# Patient Record
Sex: Female | Born: 1950 | Race: White | Hispanic: No | Smoking: Former smoker
Health system: Southern US, Community
[De-identification: ages and names within clinical notes are randomized; demographics above are authoritative.]

## PROBLEM LIST (undated history)

## (undated) DIAGNOSIS — J449 Chronic obstructive pulmonary disease, unspecified: Secondary | ICD-10-CM

## (undated) DIAGNOSIS — D693 Immune thrombocytopenic purpura: Secondary | ICD-10-CM

## (undated) DIAGNOSIS — I959 Hypotension, unspecified: Secondary | ICD-10-CM

## (undated) DIAGNOSIS — K7460 Unspecified cirrhosis of liver: Secondary | ICD-10-CM

## (undated) DIAGNOSIS — F319 Bipolar disorder, unspecified: Secondary | ICD-10-CM

## (undated) DIAGNOSIS — K760 Fatty (change of) liver, not elsewhere classified: Secondary | ICD-10-CM

## (undated) DIAGNOSIS — K5790 Diverticulosis of intestine, part unspecified, without perforation or abscess without bleeding: Secondary | ICD-10-CM

## (undated) DIAGNOSIS — I700 Atherosclerosis of aorta: Secondary | ICD-10-CM

## (undated) DIAGNOSIS — E559 Vitamin D deficiency, unspecified: Secondary | ICD-10-CM

## (undated) DIAGNOSIS — E119 Type 2 diabetes mellitus without complications: Secondary | ICD-10-CM

## (undated) DIAGNOSIS — I639 Cerebral infarction, unspecified: Secondary | ICD-10-CM

## (undated) HISTORY — DX: Bipolar disorder, unspecified: F31.9

## (undated) HISTORY — DX: Unspecified cirrhosis of liver: K74.60

## (undated) HISTORY — PX: OTHER SURGICAL HISTORY: SHX169

## (undated) HISTORY — DX: Fatty (change of) liver, not elsewhere classified: K76.0

## (undated) HISTORY — DX: Immune thrombocytopenic purpura: D69.3

## (undated) HISTORY — DX: Atherosclerosis of aorta: I70.0

## (undated) HISTORY — PX: ABDOMINAL HYSTERECTOMY: SHX81

## (undated) HISTORY — DX: Diverticulosis of intestine, part unspecified, without perforation or abscess without bleeding: K57.90

## (undated) HISTORY — DX: Vitamin D deficiency, unspecified: E55.9

## (undated) HISTORY — PX: SPLENECTOMY: SUR1306

## (undated) HISTORY — DX: Hypotension, unspecified: I95.9

---

## 1997-09-25 DIAGNOSIS — D693 Immune thrombocytopenic purpura: Secondary | ICD-10-CM | POA: Insufficient documentation

## 1997-10-26 DIAGNOSIS — D693 Immune thrombocytopenic purpura: Secondary | ICD-10-CM | POA: Insufficient documentation

## 1998-09-18 DIAGNOSIS — Z9081 Acquired absence of spleen: Secondary | ICD-10-CM

## 1998-09-18 HISTORY — DX: Acquired absence of spleen: Z90.81

## 1999-02-27 DIAGNOSIS — Z9081 Acquired absence of spleen: Secondary | ICD-10-CM | POA: Insufficient documentation

## 2004-05-30 ENCOUNTER — Ambulatory Visit: Payer: Self-pay | Admitting: Oncology

## 2004-08-22 ENCOUNTER — Ambulatory Visit: Payer: Self-pay | Admitting: Oncology

## 2005-03-30 ENCOUNTER — Ambulatory Visit: Payer: Self-pay | Admitting: Oncology

## 2005-07-03 ENCOUNTER — Ambulatory Visit: Payer: Self-pay | Admitting: Oncology

## 2005-11-02 ENCOUNTER — Ambulatory Visit: Payer: Self-pay | Admitting: Oncology

## 2006-04-19 ENCOUNTER — Ambulatory Visit: Payer: Self-pay | Admitting: Oncology

## 2006-10-04 ENCOUNTER — Ambulatory Visit: Payer: Self-pay | Admitting: Oncology

## 2009-02-26 DIAGNOSIS — D72829 Elevated white blood cell count, unspecified: Secondary | ICD-10-CM | POA: Insufficient documentation

## 2015-11-29 DIAGNOSIS — D693 Immune thrombocytopenic purpura: Secondary | ICD-10-CM | POA: Diagnosis not present

## 2016-12-02 DIAGNOSIS — Z716 Tobacco abuse counseling: Secondary | ICD-10-CM

## 2016-12-02 DIAGNOSIS — I38 Endocarditis, valve unspecified: Secondary | ICD-10-CM | POA: Diagnosis not present

## 2016-12-02 DIAGNOSIS — N6310 Unspecified lump in the right breast, unspecified quadrant: Secondary | ICD-10-CM

## 2016-12-02 DIAGNOSIS — R945 Abnormal results of liver function studies: Secondary | ICD-10-CM | POA: Diagnosis not present

## 2016-12-02 DIAGNOSIS — D693 Immune thrombocytopenic purpura: Secondary | ICD-10-CM | POA: Diagnosis not present

## 2016-12-22 DIAGNOSIS — D241 Benign neoplasm of right breast: Secondary | ICD-10-CM | POA: Insufficient documentation

## 2017-01-29 DIAGNOSIS — Z136 Encounter for screening for cardiovascular disorders: Secondary | ICD-10-CM

## 2017-01-29 HISTORY — PX: BREAST LUMPECTOMY: SHX2

## 2017-02-18 DIAGNOSIS — Z09 Encounter for follow-up examination after completed treatment for conditions other than malignant neoplasm: Secondary | ICD-10-CM | POA: Insufficient documentation

## 2017-06-18 DIAGNOSIS — Z9081 Acquired absence of spleen: Secondary | ICD-10-CM | POA: Diagnosis not present

## 2017-06-18 DIAGNOSIS — Z720 Tobacco use: Secondary | ICD-10-CM | POA: Diagnosis not present

## 2017-06-18 DIAGNOSIS — F329 Major depressive disorder, single episode, unspecified: Secondary | ICD-10-CM

## 2017-06-18 DIAGNOSIS — D693 Immune thrombocytopenic purpura: Secondary | ICD-10-CM

## 2017-06-18 DIAGNOSIS — D72828 Other elevated white blood cell count: Secondary | ICD-10-CM | POA: Diagnosis not present

## 2017-12-02 DIAGNOSIS — D72829 Elevated white blood cell count, unspecified: Secondary | ICD-10-CM

## 2017-12-02 DIAGNOSIS — D693 Immune thrombocytopenic purpura: Secondary | ICD-10-CM | POA: Diagnosis not present

## 2017-12-02 DIAGNOSIS — Z9081 Acquired absence of spleen: Secondary | ICD-10-CM

## 2017-12-02 DIAGNOSIS — R928 Other abnormal and inconclusive findings on diagnostic imaging of breast: Secondary | ICD-10-CM | POA: Diagnosis not present

## 2017-12-02 DIAGNOSIS — B370 Candidal stomatitis: Secondary | ICD-10-CM | POA: Diagnosis not present

## 2017-12-02 DIAGNOSIS — Z720 Tobacco use: Secondary | ICD-10-CM

## 2018-05-25 DIAGNOSIS — S5290XB Unspecified fracture of unspecified forearm, initial encounter for open fracture type I or II: Secondary | ICD-10-CM

## 2018-05-25 DIAGNOSIS — S52502B Unspecified fracture of the lower end of left radius, initial encounter for open fracture type I or II: Secondary | ICD-10-CM

## 2018-05-26 DIAGNOSIS — S5290XB Unspecified fracture of unspecified forearm, initial encounter for open fracture type I or II: Secondary | ICD-10-CM | POA: Diagnosis not present

## 2018-05-26 DIAGNOSIS — S52502B Unspecified fracture of the lower end of left radius, initial encounter for open fracture type I or II: Secondary | ICD-10-CM | POA: Diagnosis not present

## 2018-05-27 DIAGNOSIS — S5290XB Unspecified fracture of unspecified forearm, initial encounter for open fracture type I or II: Secondary | ICD-10-CM | POA: Diagnosis not present

## 2018-05-27 DIAGNOSIS — S52502B Unspecified fracture of the lower end of left radius, initial encounter for open fracture type I or II: Secondary | ICD-10-CM | POA: Diagnosis not present

## 2019-06-30 LAB — HM DEXA SCAN

## 2021-01-31 ENCOUNTER — Encounter: Payer: Self-pay | Admitting: Hematology and Oncology

## 2021-01-31 ENCOUNTER — Other Ambulatory Visit: Payer: Self-pay | Admitting: Hematology and Oncology

## 2021-01-31 DIAGNOSIS — D693 Immune thrombocytopenic purpura: Secondary | ICD-10-CM

## 2021-02-05 ENCOUNTER — Inpatient Hospital Stay (HOSPITAL_COMMUNITY)
Admission: EM | Admit: 2021-02-05 | Discharge: 2021-02-20 | DRG: 813 | Disposition: A | Discharge status: Home Health | Payer: Medicare Other | Attending: Internal Medicine | Admitting: Internal Medicine

## 2021-02-05 ENCOUNTER — Emergency Department (HOSPITAL_COMMUNITY): Payer: Medicare Other

## 2021-02-05 ENCOUNTER — Other Ambulatory Visit: Payer: Self-pay

## 2021-02-05 ENCOUNTER — Encounter (HOSPITAL_COMMUNITY): Payer: Self-pay

## 2021-02-05 DIAGNOSIS — D62 Acute posthemorrhagic anemia: Secondary | ICD-10-CM | POA: Diagnosis present

## 2021-02-05 DIAGNOSIS — F17210 Nicotine dependence, cigarettes, uncomplicated: Secondary | ICD-10-CM | POA: Diagnosis present

## 2021-02-05 DIAGNOSIS — R21 Rash and other nonspecific skin eruption: Secondary | ICD-10-CM | POA: Diagnosis present

## 2021-02-05 DIAGNOSIS — Z09 Encounter for follow-up examination after completed treatment for conditions other than malignant neoplasm: Secondary | ICD-10-CM

## 2021-02-05 DIAGNOSIS — L89156 Pressure-induced deep tissue damage of sacral region: Secondary | ICD-10-CM | POA: Diagnosis present

## 2021-02-05 DIAGNOSIS — J449 Chronic obstructive pulmonary disease, unspecified: Secondary | ICD-10-CM | POA: Diagnosis present

## 2021-02-05 DIAGNOSIS — Z7982 Long term (current) use of aspirin: Secondary | ICD-10-CM | POA: Diagnosis not present

## 2021-02-05 DIAGNOSIS — R062 Wheezing: Secondary | ICD-10-CM

## 2021-02-05 DIAGNOSIS — R262 Difficulty in walking, not elsewhere classified: Secondary | ICD-10-CM | POA: Diagnosis present

## 2021-02-05 DIAGNOSIS — K922 Gastrointestinal hemorrhage, unspecified: Secondary | ICD-10-CM | POA: Diagnosis not present

## 2021-02-05 DIAGNOSIS — Y929 Unspecified place or not applicable: Secondary | ICD-10-CM

## 2021-02-05 DIAGNOSIS — D693 Immune thrombocytopenic purpura: Principal | ICD-10-CM | POA: Diagnosis present

## 2021-02-05 DIAGNOSIS — Z20822 Contact with and (suspected) exposure to covid-19: Secondary | ICD-10-CM | POA: Diagnosis present

## 2021-02-05 DIAGNOSIS — I5033 Acute on chronic diastolic (congestive) heart failure: Secondary | ICD-10-CM | POA: Diagnosis present

## 2021-02-05 DIAGNOSIS — E039 Hypothyroidism, unspecified: Secondary | ICD-10-CM | POA: Diagnosis present

## 2021-02-05 DIAGNOSIS — M25511 Pain in right shoulder: Secondary | ICD-10-CM | POA: Diagnosis present

## 2021-02-05 DIAGNOSIS — Z79899 Other long term (current) drug therapy: Secondary | ICD-10-CM

## 2021-02-05 DIAGNOSIS — Z9081 Acquired absence of spleen: Secondary | ICD-10-CM

## 2021-02-05 DIAGNOSIS — K921 Melena: Secondary | ICD-10-CM | POA: Diagnosis present

## 2021-02-05 DIAGNOSIS — R578 Other shock: Secondary | ICD-10-CM | POA: Diagnosis present

## 2021-02-05 DIAGNOSIS — Z8673 Personal history of transient ischemic attack (TIA), and cerebral infarction without residual deficits: Secondary | ICD-10-CM | POA: Diagnosis not present

## 2021-02-05 DIAGNOSIS — T380X5A Adverse effect of glucocorticoids and synthetic analogues, initial encounter: Secondary | ICD-10-CM | POA: Diagnosis present

## 2021-02-05 DIAGNOSIS — D72829 Elevated white blood cell count, unspecified: Secondary | ICD-10-CM | POA: Diagnosis present

## 2021-02-05 DIAGNOSIS — R040 Epistaxis: Secondary | ICD-10-CM | POA: Diagnosis not present

## 2021-02-05 DIAGNOSIS — E871 Hypo-osmolality and hyponatremia: Secondary | ICD-10-CM | POA: Diagnosis not present

## 2021-02-05 DIAGNOSIS — I959 Hypotension, unspecified: Secondary | ICD-10-CM | POA: Diagnosis not present

## 2021-02-05 DIAGNOSIS — Z7951 Long term (current) use of inhaled steroids: Secondary | ICD-10-CM

## 2021-02-05 DIAGNOSIS — R571 Hypovolemic shock: Secondary | ICD-10-CM | POA: Diagnosis not present

## 2021-02-05 DIAGNOSIS — W1830XA Fall on same level, unspecified, initial encounter: Secondary | ICD-10-CM | POA: Diagnosis present

## 2021-02-05 DIAGNOSIS — R059 Cough, unspecified: Secondary | ICD-10-CM

## 2021-02-05 DIAGNOSIS — D649 Anemia, unspecified: Secondary | ICD-10-CM | POA: Diagnosis not present

## 2021-02-05 DIAGNOSIS — D72819 Decreased white blood cell count, unspecified: Secondary | ICD-10-CM | POA: Diagnosis not present

## 2021-02-05 HISTORY — DX: Cerebral infarction, unspecified: I63.9

## 2021-02-05 HISTORY — DX: Chronic obstructive pulmonary disease, unspecified: J44.9

## 2021-02-05 LAB — CBC WITH DIFFERENTIAL/PLATELET
Abs Immature Granulocytes: 1.92 10*3/uL — ABNORMAL HIGH (ref 0.00–0.07)
Basophils Absolute: 0.1 10*3/uL (ref 0.0–0.1)
Basophils Relative: 0 %
Eosinophils Absolute: 0 10*3/uL (ref 0.0–0.5)
Eosinophils Relative: 0 %
HCT: 12.5 % — ABNORMAL LOW (ref 36.0–46.0)
Hemoglobin: 4 g/dL — CL (ref 12.0–15.0)
Immature Granulocytes: 6 %
Lymphocytes Relative: 17 %
Lymphs Abs: 5.8 10*3/uL — ABNORMAL HIGH (ref 0.7–4.0)
MCH: 36.7 pg — ABNORMAL HIGH (ref 26.0–34.0)
MCHC: 32 g/dL (ref 30.0–36.0)
MCV: 114.7 fL — ABNORMAL HIGH (ref 80.0–100.0)
Monocytes Absolute: 2.4 10*3/uL — ABNORMAL HIGH (ref 0.1–1.0)
Monocytes Relative: 7 %
Neutro Abs: 23.8 10*3/uL — ABNORMAL HIGH (ref 1.7–7.7)
Neutrophils Relative %: 70 %
Platelets: 5 10*3/uL — CL (ref 150–400)
RBC: 1.09 MIL/uL — ABNORMAL LOW (ref 3.87–5.11)
RDW: 19 % — ABNORMAL HIGH (ref 11.5–15.5)
Smear Review: NORMAL
WBC: 34.1 10*3/uL — ABNORMAL HIGH (ref 4.0–10.5)
nRBC: 9 % — ABNORMAL HIGH (ref 0.0–0.2)

## 2021-02-05 LAB — COMPREHENSIVE METABOLIC PANEL
ALT: 43 U/L (ref 0–44)
AST: 41 U/L (ref 15–41)
Albumin: 2.3 g/dL — ABNORMAL LOW (ref 3.5–5.0)
Alkaline Phosphatase: 47 U/L (ref 38–126)
Anion gap: 11 (ref 5–15)
BUN: 42 mg/dL — ABNORMAL HIGH (ref 8–23)
CO2: 17 mmol/L — ABNORMAL LOW (ref 22–32)
Calcium: 7.9 mg/dL — ABNORMAL LOW (ref 8.9–10.3)
Calcium: 9.2 (ref 8.7–10.7)
Chloride: 113 mmol/L — ABNORMAL HIGH (ref 98–111)
Creatinine, Ser: 1.14 mg/dL — ABNORMAL HIGH (ref 0.44–1.00)
GFR, Estimated: 52 mL/min — ABNORMAL LOW (ref >60)
Glucose, Bld: 152 mg/dL — ABNORMAL HIGH (ref 70–99)
Potassium: 3.8 mmol/L (ref 3.5–5.1)
Sodium: 141 mmol/L (ref 135–145)
Total Bilirubin: 0.6 mg/dL (ref 0.3–1.2)
Total Protein: 4.9 g/dL — ABNORMAL LOW (ref 6.5–8.1)

## 2021-02-05 LAB — LACTIC ACID, PLASMA
Lactic Acid, Venous: 5 mmol/L (ref 0.5–1.9)
Lactic Acid, Venous: 4.4 mmol/L (ref 0.5–1.9)

## 2021-02-05 LAB — BASIC METABOLIC PANEL
BUN: 18 (ref 4–21)
CO2: 28 — AB (ref 13–22)
Chloride: 96 — AB (ref 99–108)
Creatinine: 0.8 (ref 0.5–1.1)
Glucose: 112
Potassium: 4.7 (ref 3.4–5.3)
Sodium: 132 — AB (ref 137–147)

## 2021-02-05 LAB — CBC AND DIFFERENTIAL
HCT: 39 (ref 36–46)
Hemoglobin: 12.6 (ref 12.0–16.0)
Platelets: 63 — AB (ref 150–399)
WBC: 26.9

## 2021-02-05 LAB — I-STAT CHEM 8, ED
BUN: 41 mg/dL — ABNORMAL HIGH (ref 8–23)
Calcium, Ion: 1.13 mmol/L — ABNORMAL LOW (ref 1.15–1.40)
Chloride: 110 mmol/L (ref 98–111)
Creatinine, Ser: 0.9 mg/dL (ref 0.44–1.00)
Glucose, Bld: 146 mg/dL — ABNORMAL HIGH (ref 70–99)
HCT: 19 % — ABNORMAL LOW (ref 36.0–46.0)
Hemoglobin: 6.5 g/dL — CL (ref 12.0–15.0)
Potassium: 3.9 mmol/L (ref 3.5–5.1)
Sodium: 141 mmol/L (ref 135–145)
TCO2: 18 mmol/L — ABNORMAL LOW (ref 22–32)

## 2021-02-05 LAB — RESP PANEL BY RT-PCR (FLU A&B, COVID) ARPGX2
Influenza A by PCR: NEGATIVE
Influenza B by PCR: NEGATIVE
SARS Coronavirus 2 by RT PCR: NEGATIVE

## 2021-02-05 LAB — PREPARE RBC (CROSSMATCH)

## 2021-02-05 LAB — CBC: RBC: 3.88 (ref 3.87–5.11)

## 2021-02-05 LAB — TROPONIN I (HIGH SENSITIVITY)
Troponin I (High Sensitivity): 21 ng/L — ABNORMAL HIGH (ref <18)
Troponin I (High Sensitivity): 27 ng/L — ABNORMAL HIGH (ref <18)

## 2021-02-05 LAB — PROTIME-INR
INR: 1.4 — ABNORMAL HIGH (ref 0.8–1.2)
Prothrombin Time: 16.7 seconds — ABNORMAL HIGH (ref 11.4–15.2)

## 2021-02-05 LAB — SAVE SMEAR(SSMR), FOR PROVIDER SLIDE REVIEW

## 2021-02-05 LAB — APTT: aPTT: 20 seconds — ABNORMAL LOW (ref 24–36)

## 2021-02-05 LAB — ABO/RH: ABO/RH(D): O POS

## 2021-02-05 LAB — POC OCCULT BLOOD, ED: Fecal Occult Bld: POSITIVE — AB

## 2021-02-05 LAB — HEMOGLOBIN AND HEMATOCRIT, BLOOD
HCT: 30.1 % — ABNORMAL LOW (ref 36.0–46.0)
Hemoglobin: 9.9 g/dL — ABNORMAL LOW (ref 12.0–15.0)

## 2021-02-05 MED ORDER — FENTANYL CITRATE (PF) 100 MCG/2ML IJ SOLN
50.0000 ug | Freq: Once | INTRAMUSCULAR | Status: AC
  Administered 2021-02-05: 50 ug via INTRAVENOUS
  Filled 2021-02-05: qty 2

## 2021-02-05 MED ORDER — SODIUM CHLORIDE 0.9% IV SOLUTION: Freq: Once | INTRAVENOUS | Status: DC

## 2021-02-05 MED ORDER — SODIUM CHLORIDE 0.9 % IV SOLN: 10.0000 mL/h | Freq: Once | INTRAVENOUS | Status: DC

## 2021-02-05 MED ORDER — SODIUM CHLORIDE 0.9 % IV BOLUS: 500.0000 mL | Freq: Once | INTRAVENOUS | Status: DC

## 2021-02-05 MED ORDER — PANTOPRAZOLE SODIUM 40 MG IV SOLR
40.0000 mg | Freq: Once | INTRAVENOUS | Status: AC
  Administered 2021-02-05: 40 mg via INTRAVENOUS
  Filled 2021-02-05: qty 40

## 2021-02-05 MED ORDER — SODIUM CHLORIDE 0.9 % IV BOLUS
1000.0000 mL | Freq: Once | INTRAVENOUS | Status: AC
  Administered 2021-02-05: 1000 mL via INTRAVENOUS

## 2021-02-05 MED ORDER — IMMUNE GLOBULIN (HUMAN) 10 GM/100ML IV SOLN
1.0000 g/kg | INTRAVENOUS | Status: AC
  Administered 2021-02-06 – 2021-02-07 (×2): 85 g via INTRAVENOUS
  Filled 2021-02-05 (×2): qty 800

## 2021-02-05 MED ORDER — DIPHENHYDRAMINE HCL 50 MG/ML IJ SOLN
25.0000 mg | Freq: Once | INTRAMUSCULAR | Status: AC
  Administered 2021-02-05: 25 mg via INTRAVENOUS
  Filled 2021-02-05: qty 1

## 2021-02-05 MED ORDER — NOREPINEPHRINE 4 MG/250ML-% IV SOLN
2.0000 ug/min | INTRAVENOUS | Status: DC
  Administered 2021-02-05: 2 ug/min via INTRAVENOUS
  Administered 2021-02-06: 6 ug/min via INTRAVENOUS
  Filled 2021-02-05 (×2): qty 250

## 2021-02-05 MED ORDER — MOMETASONE FURO-FORMOTEROL FUM 100-5 MCG/ACT IN AERO
2.0000 | INHALATION_SPRAY | Freq: Two times a day (BID) | RESPIRATORY_TRACT | Status: DC
  Administered 2021-02-06 – 2021-02-20 (×29): 2 via RESPIRATORY_TRACT
  Filled 2021-02-05: qty 8.8

## 2021-02-05 MED ORDER — CALCIUM GLUCONATE-NACL 1-0.675 GM/50ML-% IV SOLN
1.0000 g | Freq: Once | INTRAVENOUS | Status: AC
  Administered 2021-02-05: 1000 mg via INTRAVENOUS
  Filled 2021-02-05: qty 50

## 2021-02-05 MED ORDER — SODIUM CHLORIDE 0.9 % IV SOLN
250.0000 mL | INTRAVENOUS | Status: DC
Start: 2021-02-05 — End: 2021-02-20

## 2021-02-05 MED ORDER — IPRATROPIUM-ALBUTEROL 0.5-2.5 (3) MG/3ML IN SOLN: 3.0000 mL | RESPIRATORY_TRACT | Status: DC | PRN

## 2021-02-05 MED ORDER — DEXAMETHASONE SODIUM PHOSPHATE 10 MG/ML IJ SOLN
10.0000 mg | INTRAMUSCULAR | Status: DC
  Administered 2021-02-06 – 2021-02-07 (×2): 10 mg via INTRAVENOUS
  Filled 2021-02-05 (×3): qty 1

## 2021-02-05 MED ORDER — ACETAMINOPHEN 10 MG/ML IV SOLN
1000.0000 mg | Freq: Once | INTRAVENOUS | Status: AC
  Administered 2021-02-05: 1000 mg via INTRAVENOUS
  Filled 2021-02-05: qty 100

## 2021-02-05 MED ORDER — SODIUM CHLORIDE 0.9 % IV SOLN
10.0000 mL/h | Freq: Once | INTRAVENOUS | Status: AC
  Administered 2021-02-05: 10 mL/h via INTRAVENOUS

## 2021-02-05 MED ORDER — PANTOPRAZOLE INFUSION (NEW) - SIMPLE MED
8.0000 mg/h | INTRAVENOUS | Status: DC
  Administered 2021-02-05 – 2021-02-06 (×3): 8 mg/h via INTRAVENOUS
  Filled 2021-02-05 (×2): qty 80

## 2021-02-05 MED ORDER — METHYLPREDNISOLONE SODIUM SUCC 125 MG IJ SOLR
125.0000 mg | Freq: Once | INTRAMUSCULAR | Status: AC
  Administered 2021-02-05: 125 mg via INTRAVENOUS
  Filled 2021-02-05: qty 2

## 2021-02-05 NOTE — ED Provider Notes (Addendum)
[facility] EMERGENCY DEPARTMENT Provider Note   CSN: [id number] Arrival date & time: 02/05/21  1332     History Chief Complaint  Patient presents with   Weakness    Weakness for 3 weeks.    Jean Kramer is a 70 y.o. female.  HPI 70 year old female presents with generalized weakness.  This has been ongoing for about 2-3 weeks but much worse today.  She has been short of breath on exertion and somewhat lightheaded.  There has been no chest pain.  She has daily headaches.  No focal weakness but it is more diffuse.  She is also been having dark/black stools for the same time.  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  This has been going on the last 2-3 weeks.  She has been taking BC for her headache though she knows she is not supposed to.  No abdominal pain.  EMS found her to be quite hypotensive to the point they could not feel radial pulses.  They gave about 500 cc IV fluids and her heart rate has come down into the 110s and blood pressure has come up. Most recent BP was in 80s.  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 No pertinent surgical history.   OB History   No obstetric history on file.     History reviewed. No pertinent family history.     Home Medications Prior to Admission medications   Not on File    Allergies    Patient has no allergy information on record.  Review of Systems   Review of Systems  Constitutional:  Positive for fatigue. Negative for fever.  Respiratory:  Positive for shortness of breath.   Cardiovascular:  Negative for chest pain.  Gastrointestinal:  Negative for abdominal pain and vomiting.   Neurological:  Positive for weakness, light-headedness and headaches.   Physical Exam Updated Vital Signs BP (!) 73/46   Pulse (!) 103   Temp 97.6 F (36.4 C) (Oral)   Resp 17   SpO2 100%   Physical Exam Vitals and nursing note reviewed.  Constitutional:      General: She is not in acute distress.    Appearance: She is well-developed. She is ill-appearing. She is not diaphoretic.  HENT:     Head: Normocephalic and atraumatic.     Right Ear: External ear normal.     Left Ear: External ear normal.     Nose: Nose normal.     Mouth/Throat:     Mouth: Mucous membranes are dry.  Eyes:     General:        Right eye: No discharge.        Left eye: No discharge.     Extraocular Movements: Extraocular movements intact.     Pupils: Pupils are equal, round, and reactive to light.  Cardiovascular:     Rate and Rhythm: Regular rhythm. Tachycardia present.     Heart sounds: Normal heart sounds.  Pulmonary:     Effort: Pulmonary effort is normal.     Breath sounds: Normal breath sounds.  Abdominal:     General:  There is no distension.     Palpations: Abdomen is soft.     Tenderness: There is no abdominal tenderness.  Skin:    General: Skin is warm and dry.     Coloration: Skin is pale.     Findings: Rash (diffuse petechiae) present.  Neurological:     Mental Status: She is alert and oriented to person, place, and time.     Comments: CN 3-12 grossly intact. 5/5 strength in all 4 extremities. Grossly normal sensation.  Psychiatric:        Mood and Affect: Mood is not anxious.    ED Results / Procedures / Treatments   Labs (all labs ordered are listed, but only abnormal results are displayed) Labs Reviewed  COMPREHENSIVE METABOLIC PANEL - Abnormal; Notable for the following components:      Result Value   Chloride 113 (*)    CO2 17 (*)    Glucose, Bld 152 (*)    BUN 42 (*)    Creatinine, Ser 1.14 (*)    Calcium 7.9 (*)    Total Protein 4.9 (*)    Albumin 2.3 (*)    GFR,  Estimated 52 (*)    All other components within normal limits  CBC WITH DIFFERENTIAL/PLATELET - Abnormal; Notable for the following components:   WBC 34.1 (*)    RBC 1.09 (*)    Hemoglobin 4.0 (*)    HCT 12.5 (*)    MCV 114.7 (*)    MCH 36.7 (*)    RDW 19.0 (*)    Platelets 5 (*)    nRBC 9.0 (*)    Neutro Abs 23.8 (*)    Lymphs Abs 5.8 (*)    Monocytes Absolute 2.4 (*)    Abs Immature Granulocytes 1.92 (*)    All other components within normal limits  PROTIME-INR - Abnormal; Notable for the following components:   Prothrombin Time 16.7 (*)    INR 1.4 (*)    All other components within normal limits  APTT - Abnormal; Notable for the following components:   aPTT 20 (*)    All other components within normal limits  LACTIC ACID, PLASMA - Abnormal; Notable for the following components:   Lactic Acid, Venous 5.0 (*)    All other components within normal limits  I-STAT CHEM 8, ED - Abnormal; Notable for the following components:   BUN 41 (*)    Glucose, Bld 146 (*)    Calcium, Ion 1.13 (*)    TCO2 18 (*)    Hemoglobin 6.5 (*)    HCT 19.0 (*)    All other components within normal limits  POC OCCULT BLOOD, ED - Abnormal; Notable for the following components:   Fecal Occult Bld POSITIVE (*)    All other components within normal limits  TROPONIN I (HIGH SENSITIVITY) - Abnormal; Notable for the following components:   Troponin I (High Sensitivity) 21 (*)    All other components within normal limits  RESP PANEL BY RT-PCR (FLU A&B, COVID) ARPGX2  URINALYSIS, ROUTINE W REFLEX MICROSCOPIC  LACTIC ACID, PLASMA  TYPE AND SCREEN  PREPARE RBC (CROSSMATCH)  ABO/RH  PREPARE RBC (CROSSMATCH)  PREPARE PLATELET PHERESIS  TROPONIN I (HIGH SENSITIVITY)    EKG EKG Interpretation  Date/Time:  Wednesday February 05 2021 13:33:22 EDT Ventricular Rate:  111 PR Interval:  144 QRS Duration: 61 QT Interval:  314 QTC Calculation: 427 R Axis:   64 Text Interpretation: Sinus tachycardia Abnormal  R-wave progression, early transition Repol  abnrm suggests ischemia, diffuse leads No old tracing to compare Confirmed by Sherwood Gambler [phone]) on 02/05/2021 1:40:48 PM  Radiology CT Head Wo Contrast  Result Date: 02/05/2021 CLINICAL DATA:  Headache, suspected intracranial hemorrhage EXAM: CT HEAD WITHOUT CONTRAST TECHNIQUE: Contiguous axial images were obtained from the base of the skull through the vertex without intravenous contrast. COMPARISON:  03/31/2006 FINDINGS: Brain: No evidence of acute infarction, hemorrhage, hydrocephalus, extra-axial collection or mass lesion/mass effect. Mild atrophy. Vascular: Atherosclerotic and physiologic intracranial calcifications. Skull: Normal. Negative for fracture or focal lesion. Sinuses/Orbits: Dense opacification of the left maxillary sinus and partial opacification of left ethmoid air cells. Orbits unremarkable. Other: 2cm parietal scalp lesion near the midline and1 cm right parietooccipital scalp lesion, possibly sebaceous cyst but nonspecific. IMPRESSION: 1. No acute intracranial process. 2. Left maxillary and ethmoid sinus disease Electronically Signed   By: Lucrezia Europe M.D.   On: 02/05/2021 14:51    Procedures .Critical Care  Date/Time: 02/05/2021 3:39 PM Performed by: Sherwood Gambler, MD Authorized by: Sherwood Gambler, MD   Critical care provider statement:    Critical care time (minutes):  60   Critical care time was exclusive of:  Separately billable procedures and treating other patients   Critical care was necessary to treat or prevent imminent or life-threatening deterioration of the following conditions:  Circulatory failure   Critical care was time spent personally by me on the following activities:  Discussions with consultants, evaluation of patient's response to treatment, examination of patient, ordering and performing treatments and interventions, ordering and review of laboratory studies, ordering and review of radiographic studies,  pulse oximetry, re-evaluation of patient's condition, obtaining history from patient or surrogate and review of old charts   Medications Ordered in ED Medications  0.9 %  sodium chloride infusion (has no administration in time range)  sodium chloride 0.9 % bolus 500 mL (has no administration in time range)  methylPREDNISolone sodium succinate (SOLU-MEDROL) 125 mg/2 mL injection 125 mg (has no administration in time range)  0.9 %  sodium chloride infusion (Manually program via Guardrails IV Fluids) (has no administration in time range)  sodium chloride 0.9 % bolus 1,000 mL (0 mLs Intravenous Stopped 02/05/21 1445)  pantoprazole (PROTONIX) injection 40 mg (40 mg Intravenous Given 02/05/21 1429)  0.9 %  sodium chloride infusion (10 mL/hr Intravenous New Bag/Given 02/05/21 1439)  fentaNYL (SUBLIMAZE) injection 50 mcg (50 mcg Intravenous Given 02/05/21 1425)    ED Course  I have reviewed the triage vital signs and the nursing notes.  Pertinent labs & imaging results that were available during my care of the patient were reviewed by me and considered in my medical decision making (see chart for details).    MDM Rules/Calculators/A&P                           Patient's blood pressure is normal on first appearance with some mild tachycardia.  She was started on some IV fluids.  Initial hemoglobin on the point-of-care testing comes back at 6.5 so unit of blood was ordered.  It sounds like she has been having a slow likely upper GI bleed for weeks.  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  While awaiting on the blood transfusion her blood pressure taint and went into the 40s and 50s.  While she maintained her mental status, her blood pressure was persistently low so emergency release blood was called for.  With this her blood pressure did come up  into the 80s.  Her CBC finally resulted and shows significant thrombocytopenia with a platelet count of 5 and hemoglobin of 4.  I discussed with Dr. Alen Blew, he  recommends 125 mg Solu-Medrol and 1 unit of platelets in addition to the 2 units of blood.  He will consult.  Gastroenterology has also been consulted, Velora Heckler will see.  She has been previously given IV Protonix.  Otherwise, ICU was consulted for admission.  4:04 PM Addendum: Patient's blood pressure dropped again into the 50s.  I have updated ICU.  Discussed with Dr. Alen Blew, will give platelets as per 1:1:1 protocol and only give the 1 extra dose of platelets for the ITP itself.  She will be given FFP and platelets and blood.  Will defer to ICU as far as to specifically activate massive transfusion if not improving after this. Final Clinical Impression(s) / ED Diagnoses Final diagnoses:  Acute blood loss anemia  Acute ITP (HCC)  Hypovolemic shock (HCC)    Rx / DC Orders ED Discharge Orders     None        Sherwood Gambler, MD 02/05/21 1540    Sherwood Gambler, MD 02/05/21 [phone]

## 2021-02-05 NOTE — H&P (Addendum)
NAME:  Jean Kramer, MRN:  [id number], DOB:  Apr 05, 1951, LOS: 0 ADMISSION DATE:  02/05/2021, CONSULTATION DATE:  02/05/21  REFERRING MD:  Sherwood Gambler, MD, CHIEF COMPLAINT:  thrombocytopenia, weakness, hypotension   History of Present Illness:  Jean Kramer is a 70 year old female who presents for 3 weeks of progressive weakness and black stools. States she noticed petechial rash on legs 3 weeks ago and since spread to her arms, chest and back. Notes associated fatigue, lightheadedness, and shortness of breath. Has been having difficulty walking to and from bathroom and feel 2 nights ago. Denies hitting her head or LOC. Since her symptoms started she has been having daily black stools. Has not had any bright red stools. Has had a poor appetite and one episodes of vomiting yesterday night. She is unsure if there was any blood when she threw up. This morning woke up with liquid dark stools and worsening lightheadedness.   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 Has been in remission since then. She stakes daily goody powder and BC powder for headaches. Does not taker her aspirin due to this. Was also started on levothyroxine about a month ago. Otherwise denies any other medication changes or recent illness.  She denies fever, chest pain, dyspnea, hematemesis, brbpr, nausea, vomiting, or abdominal pain.    In ED patin presented with hypotension to the 80s and tachycardia. Initially H&H of 6.5 and then 4 on CBC. Leukocytosis of 34 and platelet count of 5. Patient received IVF, solumedrol, 2 units pRBCs, and 1 unit of platelets.  GI consulted for bleeding and hematology consulted for thrombocytopenia. BP remain low after fluids and ICU was consulted for admission.  Pertinent  Medical History  ITP sp splenectomy x2 most recently in 2000, COPD, stroke  Significant Hospital  Events: Including procedures, antibiotic start and stop dates in addition to other pertinent events     Interim History / Subjective:  Patient sp 2 units of blood 1L NS, solumedrol. Continues to feel generalized weakness. BP improved to 110s/50 while in room.   Objective   Blood pressure (!) 62/42, pulse 94, temperature 98.1 F (36.7 C), temperature source Oral, resp. rate 20, SpO2 100 %.        Intake/Output Summary (Last 24 hours) at 02/05/2021 1754 Last data filed at 02/05/2021 1746 Gross per 24 hour  Intake 1356.54 ml  Output --  Net 1356.54 ml   There were no vitals filed for this visit.  Examination: General: Tired appear woman, laying in bed in no acute distress HENT: Normocephalic, dry mucous membranes  Lungs: Diffuse wheezing bilaterally, no accessory muscle use  Cardiovascular: Regular rate and rhythm, no murmurs, gallops or rubs Abdomen: soft, nontender, nondistended, no hepatosplenomegaly Extremities: Cool, distal pulses intact Neuro: Aox4, no focal weakness Skin: Purpuric rash along bilateral LE, UE, chest and back, scattered bruising predominantly on upper and lower extremities as well as chest   CT head without acute abnormalities  Resolved Hospital Problem list     Assessment & Plan:  Hemorrhagic shock like due to upper GI bleed Hypotension improving with 1L NS and 2 units pRBCs. Appears dry on exam. No need for pressors at this time -Transfuse pRBCs, FFP, platelets  -post transfusion H&H - Monitor vitals   Thrombocytopenia Presented with platelets of 5. Likely relapse in ITP. S/p Solumedrol, 2 units RBC in ED.  -  Hematology consulted, appreciate recommendations - Continue steroids -Transfuse to maintain platelets >50 - IVIG per hematology -Follow up hepatitis panel -Consider CT abdomen pelvis to evaluate for accessory spleen - CBC in morning  Anemia likely secondary to GI bleed Patient with melenotic Stool likely in setting of upper GI bleed due  to daily NSAID use and thrombocytopenia.  Sp 2 units pRBCs - GI consulted appreciate recommendations - Transfuse pRBCs - Check post transfusion hemoglobin - Protonix infusion - NPO - Upper endoscopy per GI    Leukocytosis  WBC of 34, does not appear infected. May be reactive in setting of gi bleed and thrombocytopenia. - monitor CBC  COPD Diffuse wheezing on exam. Denies increase wheezing, cough, or sputum. On Symbicort at home - dulera twice daily   Best Practice (right click and "Reselect all SmartList Selections" daily)  Diet/type: NPO DVT prophylaxis: SCD GI prophylaxis: PPI Lines: N/A Foley:  N/A Code Status:  full code Last date of multidisciplinary goals of care discussion [n/a]  Labs   CBC: Recent Labs  Lab 02/05/21 1350 02/05/21 1403  WBC  --  34.1*  NEUTROABS  --  23.8*  HGB 6.5* 4.0*  HCT 19.0* 12.5*  MCV  --  114.7*  PLT  --  5*    Basic Metabolic Panel: Recent Labs  Lab 02/05/21 1350 02/05/21 1403  NA 141 141  K 3.9 3.8  CL 110 113*  CO2  --  17*  GLUCOSE 146* 152*  BUN 41* 42*  CREATININE 0.90 1.14*  CALCIUM  --  7.9*   GFR: CrCl cannot be calculated (Unknown ideal weight.). Recent Labs  Lab 02/05/21 1341 02/05/21 1403 02/05/21 1541  WBC  --  34.1*  --   LATICACIDVEN 5.0*  --  4.4*    Liver Function Tests: Recent Labs  Lab 02/05/21 1403  AST 41  ALT 43  ALKPHOS 47  BILITOT 0.6  PROT 4.9*  ALBUMIN 2.3*   No results for input(s): LIPASE, AMYLASE in the last 168 hours. No results for input(s): AMMONIA in the last 168 hours.  ABG    Component Value Date/Time   TCO2 18 (L) 02/05/2021 1350     Coagulation Profile: Recent Labs  Lab 02/05/21 1403  INR 1.4*    Review of Systems:   Review of Systems  Constitutional:  Negative for chills and fever.  HENT:  Negative for nosebleeds and sore throat.   Respiratory:  Positive for shortness of breath. Negative for cough and wheezing.   Cardiovascular:  Negative for chest  pain and palpitations.  Gastrointestinal:  Positive for melena and vomiting. Negative for abdominal pain and nausea.  Genitourinary:  Negative for dysuria and frequency.  Skin:  Positive for rash.  Neurological:  Positive for dizziness. Negative for focal weakness and loss of consciousness.  Endo/Heme/Allergies:  Bruises/bleeds easily.    Past Medical History:  She,  has a past medical history of COPD (chronic obstructive pulmonary disease) ([place]), H/O splenectomy (09/1998), and Stroke ([place]).   Surgical History:  History reviewed. No pertinent surgical history.   Social History:   Lives at home, smokes 1 pack a day with 50 pack year history, denies alcohol or other drug use  Family History:  Her family history is not on file.   Allergies Not on File   Home Medications  Prior to Admission medications   Medication Sig Start Date End Date Taking? Authorizing Provider  alendronate (FOSAMAX) 70 MG tablet Take 70 mg by mouth once a week.  12/06/20   [provider]  ALPRAZolam Duanne Moron) 1 MG tablet Take 0.5-1 mg by mouth daily as needed for anxiety. 01/16/21   [provider]  aspirin 81 MG EC tablet Take 81 mg by mouth daily.    [provider]  budesonide-formoterol (SYMBICORT) 80-4.5 MCG/ACT inhaler Inhale 2 puffs into the lungs 2 (two) times daily.    [provider]  busPIRone (BUSPAR) 15 MG tablet Take 15 mg by mouth 2 (two) times daily. 12/03/20   [provider]  calcium-vitamin D (OSCAL WITH D) 500-200 MG-UNIT TABS tablet Take 1 tablet by mouth 2 (two) times daily with a meal.    [provider]  escitalopram (LEXAPRO) 20 MG tablet Take 20 mg by mouth daily. 11/04/20   [provider]  furosemide (LASIX) 20 MG tablet Take 20 mg by mouth daily. 01/16/21   [provider]  levothyroxine (SYNTHROID) 25 MCG tablet Take 25 mcg by mouth every morning. 11/20/20   [provider]  LINZESS 290 MCG CAPS capsule Take 290  mcg by mouth daily. 11/15/20   [provider]  nystatin (MYCOSTATIN/NYSTOP) powder Apply 1 application topically 3 (three) times daily. 12/30/20   [provider]  Oxycodone HCl 20 MG TABS Take 1 tablet by mouth every 8 (eight) hours as needed for severe pain. 01/16/21   [provider]  pramipexole (MIRAPEX) 0.5 MG tablet Take 0.5 mg by mouth at bedtime. 12/06/20   [provider]  QUEtiapine (SEROQUEL) 200 MG tablet Take 200 mg by mouth at bedtime. 02/01/21   [provider]  rOPINIRole (REQUIP) 0.5 MG tablet Take 0.5 mg by mouth 3 (three) times daily.    [provider]  simvastatin (ZOCOR) 40 MG tablet Take 40 mg by mouth at bedtime. 01/16/21   [provider]     Iona Beard, MD Internal Medicine, PGY-2 02/05/21 5:54 PM

## 2021-02-05 NOTE — ED Triage Notes (Signed)
[facility] reports pt coming from home for c/o weakness for the past 3 weeks, black tarry stools. Upon EMS arrival pt pulse ox 86%, cyanotic, unable to palpate radial pulses. Pt CAO. [place] [id number]

## 2021-02-05 NOTE — Consult Note (Signed)
Referring Provider: Dr. Crist Infante Primary Care Physician:  [name], MD Primary Gastroenterologist: Althia Forts.  Reason for Consultation:  Anemia, upper GI bleed   HPI: Jean Kramer is a 69 y.o. female with a past medical history significant for osteoarthritis, CVA age 24, COPD and idiopathic thrombocytopenia purpura. 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 Past appendectomy. She developed profound weakness with passing black tarry stools for the past 3 weeks.  She called her son earlier this morning and she reported feeling significantly fatigued and weak.  Her son called 79 and the EMS team assessed she was hypotensive and she received 500 cc of IV fluid.  She was transported to [facility] ED for further evaluation. Labs in the ED showed a Hemoglobin level of 6.5.  Hematocrit 19.0.   CBC showed a Hemoglobin level of 4.0.  Hematocrit 12.5.  WBC 34.1.  Platelet 5.  Sodium 141.  Potassium 3.8.  Glucose 152.  BUN 42.  Creatinine 1.14.  Calcium 7.9.  Alk phos 47.  Albumin 2.3.  AST 41.  ALT 43.  Total bili 0.6.  Troponin 21, lactic acid 5.  INR 1.4.  FOBT positive.  SARS coronavirus 2 negative.  Twelve-lead EKG without evidence of acute ischemia.  She received Pantoprazole 40 mg IV x1.  She received Methylprednisolone 125 mg IV.  She received 2 units of PRBCs and 1 unit of platelets is currently transfusing.  She remains hypotensive with a systolic blood pressure in the 70s.  Heart rate in the low 100s.  She is afebrile.  A GI consult was requested for further evaluation regarding profound anemia with active upper GI bleeding.  She denies having any past history of GI bleeding.  No heartburn or dysphagia.  No upper or lower abdominal pain.  She typically has constipation she attributes to taking narcotics for chronic leg and ankle pain.  She reported passing black solid to black loose stools once to twice daily for the past 3 weeks.   This morning she awakened soiled with a large amount of loose black stool in the bed.  No bright red rectal bleeding.  She takes [facility] powder 3 to 4 packets daily for the past 2-1/2 to 3 years for headaches.  She denies having any chest pain.  She has shortness of breath which has progressively worsened over the past week or so.  No cough or hemoptysis.  She underwent a colonoscopy at least 10 years ago which she reported was normal, no polyps.  No known family history of esophageal, gastric or colorectal cancer. She was previously followed by hematologist Dr. Hinton Rao in [place] regarding history of ITP, however, she was lost to follow up during the Covid pandemic.   CT head without contrast: 1. No acute intracranial process. 2. 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    Prior to Admission medications   Not on File    Current Facility-Administered Medications  Medication Dose Route Frequency Provider Last Rate Last Admin   0.9 %  sodium chloride infusion (Manually program via Guardrails IV Fluids)   Intravenous Once Sherwood Gambler, MD       0.9 %  sodium chloride infusion  10 mL/hr Intravenous Once Sherwood Gambler,  MD       methylPREDNISolone sodium succinate (SOLU-MEDROL) 125 mg/2 mL injection 125 mg  125 mg Intravenous Once Sherwood Gambler, MD       sodium chloride 0.9 % bolus 500 mL  500 mL Intravenous Once Sherwood Gambler, MD       No current outpatient medications on file.    Allergies as of 02/05/2021   (Not on File)    History reviewed. No pertinent family history.  Social History   Socioeconomic History   Marital status: Divorced    Spouse name: Not on file   Number of children: Not on file   Years of education: Not on file   Highest education level: Not on file   Occupational History   Not on file  Tobacco Use   Smoking status: Not on file   Smokeless tobacco: Not on file  Substance and Sexual Activity   Alcohol use: Not on file   Drug use: Not on file   Sexual activity: Not on file  Other Topics Concern   Not on file  Social History Narrative   Not on file   Social Determinants of Health   Financial Resource Strain: Not on file  Food Insecurity: Not on file  Transportation Needs: Not on file  Physical Activity: Not on file  Stress: Not on file  Social Connections: Not on file  Intimate Partner Violence: Not on file    Review of Systems: Gen: Denies fever, sweats or chills. No weight loss.  CV: Denies chest pain, palpitations or edema. Resp: See HPI. GI: See HPI. GU : Denies urinary burning, blood in urine, increased urinary frequency or incontinence. MS: + Chronic bilateral leg and ankle pain. Derm: + Petechial rash to the lower legs the past 2 to 3 weeks. Psych: Denies depression, anxiety, memory loss, suicidal ideation or confusion. Heme: + Easy bruising.  Neuro:  + Chronic headaches.  Endo:  Denies any problems with DM, thyroid or adrenal function.  Physical Exam: Vital signs in last 24 hours: Temp:  [97.6 F (36.4 C)] 97.6 F (36.4 C) (07/20 1511) Pulse Rate:  [103-110] 103 (07/20 1511) Resp:  [16-20] 17 (07/20 1511) BP: (73-166)/(46-90) 73/46 (07/20 1511) SpO2:  [100 %] 100 % (07/20 1511)   General: Ill-appearing 70 year old female alert and responsive. Head:  Normocephalic and atraumatic. Eyes:  No scleral icterus. Conjunctiva pink. Ears:  Normal auditory acuity. Nose:  No deformity, discharge or lesions. Mouth: Upper bridge.  No ulcers or lesions.  Neck:  Supple. No lymphadenopathy or thyromegaly.  Lungs: Scattered inspiratory wheezes throughout. Heart: Regular rate and rhythm, 2/6 systolic murmur. Abdomen: Soft, nondistended.  Nontender.  Positive bowel sounds all 4 quadrants.  Upper midline abdominal scar  intact. Rectal: Deferred.FOBT +. Musculoskeletal:  Symmetrical without gross deformities.  Pulses:  Normal pulses noted. Extremities:  Without clubbing or edema. Neurologic:  Alert and  oriented x4. No focal deficits.  Skin: Petechial rash to the lower extremities. Psych:  Alert and cooperative. Normal mood and affect.  Intake/Output from previous day: No intake/output data recorded. Intake/Output this shift: Total I/O In: 1000 [IV Piggyback:1000] Out: -   Lab Results: Recent Labs    02/05/21 1350 02/05/21 1403  WBC  --  34.1*  HGB 6.5* 4.0*  HCT 19.0* 12.5*  PLT  --  5*   BMET Recent Labs    02/05/21 1350 02/05/21 1403  NA 141 141  K 3.9 3.8  CL 110 113*  CO2  --  17*  GLUCOSE 146* 152*  BUN 41* 42*  CREATININE 0.90 1.14*  CALCIUM  --  7.9*   LFT Recent Labs    02/05/21 1403  PROT 4.9*  ALBUMIN 2.3*  AST 41  ALT 43  ALKPHOS 47  BILITOT 0.6   PT/INR Recent Labs    02/05/21 1403  LABPROT 16.7*  INR 1.4*   Hepatitis Panel No results for input(s): HEPBSAG, HCVAB, HEPAIGM, HEPBIGM in the last 72 hours.    Studies/Results: CT Head Wo Contrast  Result Date: 02/05/2021 CLINICAL DATA:  Headache, suspected intracranial hemorrhage EXAM: CT HEAD WITHOUT CONTRAST TECHNIQUE: Contiguous axial images were obtained from the base of the skull through the vertex without intravenous contrast. COMPARISON:  03/31/2006 FINDINGS: Brain: No evidence of acute infarction, hemorrhage, hydrocephalus, extra-axial collection or mass lesion/mass effect. Mild atrophy. Vascular: Atherosclerotic and physiologic intracranial calcifications. Skull: Normal. Negative for fracture or focal lesion. Sinuses/Orbits: Dense opacification of the left maxillary sinus and partial opacification of left ethmoid air cells. Orbits unremarkable. Other: 2cm parietal scalp lesion near the midline and1 cm right parietooccipital scalp lesion, possibly sebaceous cyst but nonspecific. IMPRESSION: 1. No acute  intracranial process. 2. Left maxillary and ethmoid sinus disease Electronically Signed   By: Lucrezia Europe M.D.   On: 02/05/2021 14:51    IMPRESSION/PLAN:  63.  70 year old female with ITP presented to the ED with generalized weakness, profound anemia with UGI bleed/melena. Chronic BC powder  use. Admission hemoglobin 6.5 -> 4.0.  Received 2 units of PRBCs 1 and 1 packet of platelets.  SBP in the 70s.  Patient to be admitted to the ICU. -NPO -Check post transfusion H/H -PRBC and platelet transfusions per CCM -PPI infusion  -Eventual EGD when Hg and plt counts improve and when patient is hemodynamically stable, await further recommendations per Dr. Havery Moros  2.  History of ITP.  Platelet count 5.  Received Solu-Medrol IV.  3.  Leukocytosis. WBC 34.1. She is afebrile.  -Recommend chest xray, abdominal/pelvic CT when stable   4. History of COPD   Noralyn Pick  02/05/2021, 3:39 PM

## 2021-02-05 NOTE — ED Notes (Signed)
BP 70/52. MD made aware.

## 2021-02-05 NOTE — ED Notes (Signed)
Patient transported to CT 

## 2021-02-05 NOTE — Progress Notes (Signed)
eLink Physician-Brief Progress Note Patient Name: Jean Kramer DOB: [birth year] MRN: [id number]   Date of Service  02/05/2021  HPI/Events of Note  Patient admitted to the ICU with hemorrhagic shock, GI bleeding in the context of profound thrombocytopenia secondary to presumed ITP, blood pressure has improved following volume and PRBC resuscitation, GI and Hematology are following the patient.  eICU Interventions  New Patient Evaluation.        Kerry Kass Rachna Schonberger 02/05/2021, 8:40 PM

## 2021-02-05 NOTE — Consult Note (Signed)
Reason for the request:    Thrombocytopenia  HPI: I was asked by Dr. Regenia Skeeter to evaluate Jean Kramer for thrombocytopenia.  She is a 70 year old woman with longstanding history of ITP dating back many years.  She recall having a splenectomy around the year 2000 and has been under the care of Dr. Hinton Rao at [place] [facility].  Her last evaluation was in 2019 and has not follow-up since that time without any recent exacerbation of her thrombocytopenia since that time.  She presents acutely with generalized weakness and found to have severe hypotension with systolic blood pressure in the 80s.  Laboratory testing showed initial H&H of 6.5 and 19 and subsequently a CBC showed a white cell count of 34, white cell count of 4.0 and a platelet count of 5.  Differential showed 70% neutrophils.  She reports melena for the last few weeks as well as petechiae.  He denies any hemoptysis, hematemesis or hematochezia.  He does not recall previously been on aspirin platelet stimulating agents or IVIG.  While she is in the emergency department she received 2 units of packed red cells, 1 unit of platelets as well as 1 dose of Solu-Medrol 125 mg IV.    She does not report any headaches, blurry vision, syncope or seizures. Does not report any fevers, chills or sweats.  Does not report any cough, wheezing or hemoptysis.  Does not report any chest pain, palpitation, orthopnea or leg edema.  Does not report any nausea, vomiting or abdominal pain.  Does not report any constipation or diarrhea.  Does not report any skeletal complaints.    Does not report frequency, urgency or hematuria.  Does not report any skin rashes or lesions. Does not report any heat or cold intolerance.  Does not report any lymphadenopathy or petechiae.  Does not report any anxiety or depression.  Remaining review of systems is negative.     Past Medical History:  Diagnosis Date   COPD (chronic obstructive pulmonary disease) ([place])    H/O  splenectomy 09/1998   removal of accessory spleen   Stroke [facility])   :  History reviewed. No pertinent surgical history.:   Current Facility-Administered Medications:    0.9 %  sodium chloride infusion (Manually program via Guardrails IV Fluids), , Intravenous, Once, Sherwood Gambler, MD   0.9 %  sodium chloride infusion (Manually program via Guardrails IV Fluids), , Intravenous, Once, Sherwood Gambler, MD   0.9 %  sodium chloride infusion, 10 mL/hr, Intravenous, Once, Sherwood Gambler, MD   0.9 %  sodium chloride infusion, 10 mL/hr, Intravenous, Once, Sherwood Gambler, MD   calcium gluconate 1 g/ 50 mL sodium chloride IVPB, 1 g, Intravenous, Once, Sherwood Gambler, MD   methylPREDNISolone sodium succinate (SOLU-MEDROL) 125 mg/2 mL injection 125 mg, 125 mg, Intravenous, Once, Sherwood Gambler, MD   sodium chloride 0.9 % bolus 500 mL, 500 mL, Intravenous, Once, Sherwood Gambler, MD No current outpatient medications on file.:  Not on File:  History reviewed. No pertinent family history.:   Social History   Socioeconomic History   Marital status: Divorced    Spouse name: Not on file   Number of children: Not on file   Years of education: Not on file   Highest education level: Not on file  Occupational History   Not on file  Tobacco Use   Smoking status: Not on file   Smokeless tobacco: Not on file  Substance and Sexual Activity   Alcohol use: Not on file   Drug  use: Not on file   Sexual activity: Not on file  Other Topics Concern   Not on file  Social History Narrative   Not on file   Social Determinants of Health   Financial Resource Strain: Not on file  Food Insecurity: Not on file  Transportation Needs: Not on file  Physical Activity: Not on file  Stress: Not on file  Social Connections: Not on file  Intimate Partner Violence: Not on file  :  Pertinent items are noted in HPI.  Exam: Blood pressure (!) 58/41, pulse (!) 103, temperature 98.5 F (36.9 C), resp. rate  17, SpO2 100 %.  ECOG 1  General appearance: alert and cooperative appeared chronically ill although without distress. Head: atraumatic without any abnormalities. Eyes: conjunctivae/corneas clear. PERRL.  Sclera anicteric. Throat: lips, mucosa, and tongue normal; without oral thrush or ulcers. Resp: clear to auscultation bilaterally without rhonchi, wheezes or dullness to percussion. Cardio: regular rate and rhythm, S1, S2 normal, no murmur, click, rub or gallop GI: soft, non-tender; bowel sounds normal; no masses,  no organomegaly Skin: Skin color, texture, turgor normal. No rashes or lesions Lymph nodes: Cervical, supraclavicular, and axillary nodes normal. Neurologic: Grossly normal without any motor, sensory or deep tendon reflexes. Musculoskeletal: No joint deformity or effusion.   Recent Labs    02/05/21 1350 02/05/21 1403  WBC  --  34.1*  HGB 6.5* 4.0*  HCT 19.0* 12.5*  PLT  --  5*    Recent Labs    02/05/21 1350 02/05/21 1403  NA 141 141  K 3.9 3.8  CL 110 113*  CO2  --  17*  GLUCOSE 146* 152*  BUN 41* 42*  CREATININE 0.90 1.14*  CALCIUM  --  7.9*      CT Head Wo Contrast  Result Date: 02/05/2021 CLINICAL DATA:  Headache, suspected intracranial hemorrhage EXAM: CT HEAD WITHOUT CONTRAST TECHNIQUE: Contiguous axial images were obtained from the base of the skull through the vertex without intravenous contrast. COMPARISON:  03/31/2006 FINDINGS: Brain: No evidence of acute infarction, hemorrhage, hydrocephalus, extra-axial collection or mass lesion/mass effect. Mild atrophy. Vascular: Atherosclerotic and physiologic intracranial calcifications. Skull: Normal. Negative for fracture or focal lesion. Sinuses/Orbits: Dense opacification of the left maxillary sinus and partial opacification of left ethmoid air cells. Orbits unremarkable. Other: 2cm parietal scalp lesion near the midline and1 cm right parietooccipital scalp lesion, possibly sebaceous cyst but nonspecific.  IMPRESSION: 1. No acute intracranial process. 2. Left maxillary and ethmoid sinus disease Electronically Signed   By: Lucrezia Europe M.D.   On: 02/05/2021 14:51    Assessment and Plan:   70 year old woman with:  1.  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  She has followed in the past with Dr. Martyn Ehrich at [facility].  Her laboratory data and peripheral smear were reviewed and these findings are consistent with acute ITP causing severe GI bleeding.  Management options at this time were discussed.  High-dose Solu-Medrol to start with today is reasonable and changing into the equivalent of 1 mg/kg prednisone once she is stable to be on oral medication would be recommended on a daily basis starting on 02/06/2021.  That could be given in the form of methylprednisolone or dexamethasone (10 mg daily IV).   Risks and benefits of IVIG infusion was also reviewed.  Patient does not recall ever receiving it at this time.  Infusion related complications as well as  volume overload were discussed.  Given her critical illness I am in favor of proceeding with this treatment in addition to steroids to accelerate her platelet recovery and attempt to slow her GI bleeding as fast as possible.  The plan is to give 1 mg/kg for 2 consecutive days.  I would recommend repeat platelet transfusion to keep her platelet count of 50 as long as she is bleeding at this time.  2.  Leukocytosis: This appears to be reactive at this time without any evidence of a hematological disorder.  3.  Anemia: Related to GI bleeding and supportive transfusion per the primary team to keep her hemodynamics stable.  4.  We will continue to follow her progress while she is hospitalized.  80  minutes were dedicated to this visit.  50% of time was face-to-face with the patient and her son.  The time was spent on reviewing laboratory data, imaging  studies, discussing treatment options, and answering questions regarding future plan.

## 2021-02-05 NOTE — ED Notes (Signed)
Lactic acid 5.0. hbg 4.0. platelet 5.0. MD made aware.

## 2021-02-05 NOTE — ED Notes (Signed)
Pt c/o facial numbness and tingling. ICU doc paged, was told to slow down the rate of blood transfusion, plasma and platelets. Notify MD of any further changes.

## 2021-02-06 DIAGNOSIS — K921 Melena: Secondary | ICD-10-CM | POA: Diagnosis not present

## 2021-02-06 DIAGNOSIS — D693 Immune thrombocytopenic purpura: Secondary | ICD-10-CM | POA: Diagnosis not present

## 2021-02-06 DIAGNOSIS — D62 Acute posthemorrhagic anemia: Secondary | ICD-10-CM | POA: Diagnosis not present

## 2021-02-06 DIAGNOSIS — I959 Hypotension, unspecified: Secondary | ICD-10-CM | POA: Diagnosis not present

## 2021-02-06 LAB — TYPE AND SCREEN
ABO/RH(D): O POS
Antibody Screen: NEGATIVE
Unit division: 0
Unit division: 0
Unit division: 0
Unit division: 0

## 2021-02-06 LAB — CBC WITH DIFFERENTIAL/PLATELET
Abs Immature Granulocytes: 1.35 10*3/uL — ABNORMAL HIGH (ref 0.00–0.07)
Basophils Absolute: 0.1 10*3/uL (ref 0.0–0.1)
Basophils Relative: 0 %
Eosinophils Absolute: 0 10*3/uL (ref 0.0–0.5)
Eosinophils Relative: 0 %
HCT: 30 % — ABNORMAL LOW (ref 36.0–46.0)
Hemoglobin: 10.3 g/dL — ABNORMAL LOW (ref 12.0–15.0)
Immature Granulocytes: 5 %
Lymphocytes Relative: 15 %
Lymphs Abs: 4.1 10*3/uL — ABNORMAL HIGH (ref 0.7–4.0)
MCH: 33.1 pg (ref 26.0–34.0)
MCHC: 34.3 g/dL (ref 30.0–36.0)
MCV: 96.5 fL (ref 80.0–100.0)
Monocytes Absolute: 0.6 10*3/uL (ref 0.1–1.0)
Monocytes Relative: 2 %
Neutro Abs: 21.9 10*3/uL — ABNORMAL HIGH (ref 1.7–7.7)
Neutrophils Relative %: 78 %
Platelets: 9 10*3/uL — CL (ref 150–400)
RBC: 3.11 MIL/uL — ABNORMAL LOW (ref 3.87–5.11)
RDW: 20.7 % — ABNORMAL HIGH (ref 11.5–15.5)
WBC: 28.2 10*3/uL — ABNORMAL HIGH (ref 4.0–10.5)
nRBC: 7 % — ABNORMAL HIGH (ref 0.0–0.2)

## 2021-02-06 LAB — CBC
HCT: 27.4 % — ABNORMAL LOW (ref 36.0–46.0)
HCT: 29 % — ABNORMAL LOW (ref 36.0–46.0)
Hemoglobin: 9.3 g/dL — ABNORMAL LOW (ref 12.0–15.0)
Hemoglobin: 9.7 g/dL — ABNORMAL LOW (ref 12.0–15.0)
MCH: 33.3 pg (ref 26.0–34.0)
MCH: 33.3 pg (ref 26.0–34.0)
MCHC: 33.9 g/dL (ref 30.0–36.0)
MCHC: 33.4 g/dL (ref 30.0–36.0)
MCV: 98.2 fL (ref 80.0–100.0)
MCV: 99.7 fL (ref 80.0–100.0)
Platelets: 29 10*3/uL — CL (ref 150–400)
Platelets: 32 10*3/uL — ABNORMAL LOW (ref 150–400)
RBC: 2.79 MIL/uL — ABNORMAL LOW (ref 3.87–5.11)
RBC: 2.91 MIL/uL — ABNORMAL LOW (ref 3.87–5.11)
RDW: 20.6 % — ABNORMAL HIGH (ref 11.5–15.5)
RDW: 21.4 % — ABNORMAL HIGH (ref 11.5–15.5)
WBC: 25.8 10*3/uL — ABNORMAL HIGH (ref 4.0–10.5)
WBC: 26.7 10*3/uL — ABNORMAL HIGH (ref 4.0–10.5)
nRBC: 5.3 % — ABNORMAL HIGH (ref 0.0–0.2)
nRBC: 4.6 % — ABNORMAL HIGH (ref 0.0–0.2)

## 2021-02-06 LAB — BPAM PLATELET PHERESIS
Blood Product Expiration Date: 202207202359
Blood Product Expiration Date: 202207222359
Blood Product Expiration Date: 202207222359
ISSUE DATE / TIME: 202207201548
ISSUE DATE / TIME: 202207201617
ISSUE DATE / TIME: 202207201617
Unit Type and Rh: 5100
Unit Type and Rh: 5100
Unit Type and Rh: 5100

## 2021-02-06 LAB — BPAM RBC
Blood Product Expiration Date: 202208162359
Blood Product Expiration Date: 202208162359
Blood Product Expiration Date: 202208212359
Blood Product Expiration Date: 202208212359
ISSUE DATE / TIME: 202207201506
ISSUE DATE / TIME: 202207201526
ISSUE DATE / TIME: 202207201608
ISSUE DATE / TIME: 202207201608
Unit Type and Rh: 5100
Unit Type and Rh: 5100
Unit Type and Rh: 5100
Unit Type and Rh: 5100

## 2021-02-06 LAB — LACTIC ACID, PLASMA: Lactic Acid, Venous: 2.2 mmol/L (ref 0.5–1.9)

## 2021-02-06 LAB — HIV ANTIBODY (ROUTINE TESTING W REFLEX): HIV Screen 4th Generation wRfx: NONREACTIVE

## 2021-02-06 LAB — COMPREHENSIVE METABOLIC PANEL
ALT: 41 U/L (ref 0–44)
AST: 42 U/L — ABNORMAL HIGH (ref 15–41)
Albumin: 2.9 g/dL — ABNORMAL LOW (ref 3.5–5.0)
Alkaline Phosphatase: 58 U/L (ref 38–126)
Anion gap: 9 (ref 5–15)
BUN: 26 mg/dL — ABNORMAL HIGH (ref 8–23)
CO2: 21 mmol/L — ABNORMAL LOW (ref 22–32)
Calcium: 8.4 mg/dL — ABNORMAL LOW (ref 8.9–10.3)
Chloride: 112 mmol/L — ABNORMAL HIGH (ref 98–111)
Creatinine, Ser: 0.85 mg/dL (ref 0.44–1.00)
GFR, Estimated: >60 mL/min (ref >60)
Glucose, Bld: 152 mg/dL — ABNORMAL HIGH (ref 70–99)
Potassium: 3.5 mmol/L (ref 3.5–5.1)
Sodium: 142 mmol/L (ref 135–145)
Total Bilirubin: 0.7 mg/dL (ref 0.3–1.2)
Total Protein: 5.8 g/dL — ABNORMAL LOW (ref 6.5–8.1)

## 2021-02-06 LAB — BPAM FFP
Blood Product Expiration Date: 202207242359
Blood Product Expiration Date: 202207242359
ISSUE DATE / TIME: 202207201607
ISSUE DATE / TIME: 202207201607
Unit Type and Rh: 6200
Unit Type and Rh: 6200

## 2021-02-06 LAB — PREPARE PLATELET PHERESIS
Unit division: 0
Unit division: 0
Unit division: 0

## 2021-02-06 LAB — PREPARE FRESH FROZEN PLASMA
Unit division: 0
Unit division: 0

## 2021-02-06 LAB — MRSA NEXT GEN BY PCR, NASAL: MRSA by PCR Next Gen: NOT DETECTED

## 2021-02-06 LAB — PROTIME-INR
INR: 1.2 (ref 0.8–1.2)
Prothrombin Time: 14.9 seconds (ref 11.4–15.2)

## 2021-02-06 LAB — GLUCOSE, CAPILLARY: Glucose-Capillary: 110 mg/dL — ABNORMAL HIGH (ref 70–99)

## 2021-02-06 LAB — HEPATITIS B SURFACE ANTIGEN: Hepatitis B Surface Ag: NONREACTIVE

## 2021-02-06 LAB — HEPATITIS C ANTIBODY: HCV Ab: NONREACTIVE

## 2021-02-06 MED ORDER — CHLORHEXIDINE GLUCONATE 0.12 % MT SOLN
15.0000 mL | Freq: Two times a day (BID) | OROMUCOSAL | Status: DC
  Administered 2021-02-06 – 2021-02-19 (×25): 15 mL via OROMUCOSAL
  Filled 2021-02-06 (×25): qty 15

## 2021-02-06 MED ORDER — OXYCODONE HCL 5 MG PO TABS
10.0000 mg | ORAL_TABLET | Freq: Three times a day (TID) | ORAL | Status: DC | PRN
  Administered 2021-02-06 – 2021-02-07 (×2): 10 mg via ORAL
  Filled 2021-02-06 (×3): qty 2

## 2021-02-06 MED ORDER — ACETAMINOPHEN 325 MG PO TABS
650.0000 mg | ORAL_TABLET | Freq: Four times a day (QID) | ORAL | Status: DC | PRN
  Administered 2021-02-06 – 2021-02-19 (×11): 650 mg via ORAL
  Filled 2021-02-06 (×12): qty 2

## 2021-02-06 MED ORDER — ROPINIROLE HCL 0.5 MG PO TABS
0.5000 mg | ORAL_TABLET | Freq: Three times a day (TID) | ORAL | Status: DC
  Administered 2021-02-06 – 2021-02-20 (×42): 0.5 mg via ORAL
  Filled 2021-02-06 (×45): qty 1

## 2021-02-06 MED ORDER — OXYCODONE HCL 5 MG PO TABS
10.0000 mg | ORAL_TABLET | Freq: Once | ORAL | Status: AC
  Administered 2021-02-06: 10 mg via ORAL
  Filled 2021-02-06: qty 2

## 2021-02-06 MED ORDER — CHLORHEXIDINE GLUCONATE CLOTH 2 % EX PADS
6.0000 | MEDICATED_PAD | Freq: Every day | CUTANEOUS | Status: DC
  Administered 2021-02-05 – 2021-02-09 (×5): 6 via TOPICAL

## 2021-02-06 MED ORDER — LACTATED RINGERS IV BOLUS
500.0000 mL | Freq: Once | INTRAVENOUS | Status: AC
  Administered 2021-02-06: 500 mL via INTRAVENOUS

## 2021-02-06 MED ORDER — PANTOPRAZOLE SODIUM 40 MG IV SOLR
40.0000 mg | Freq: Two times a day (BID) | INTRAVENOUS | Status: DC
  Administered 2021-02-06 – 2021-02-10 (×9): 40 mg via INTRAVENOUS
  Filled 2021-02-06 (×9): qty 40

## 2021-02-06 MED ORDER — ORAL CARE MOUTH RINSE
15.0000 mL | Freq: Two times a day (BID) | OROMUCOSAL | Status: DC
  Administered 2021-02-08 – 2021-02-18 (×19): 15 mL via OROMUCOSAL

## 2021-02-06 MED ORDER — ESCITALOPRAM OXALATE 10 MG PO TABS
20.0000 mg | ORAL_TABLET | Freq: Every day | ORAL | Status: DC
  Administered 2021-02-06 – 2021-02-20 (×15): 20 mg via ORAL
  Filled 2021-02-06 (×15): qty 2

## 2021-02-06 MED ORDER — LEVOTHYROXINE SODIUM 25 MCG PO TABS
25.0000 ug | ORAL_TABLET | Freq: Every morning | ORAL | Status: DC
  Administered 2021-02-07 – 2021-02-20 (×14): 25 ug via ORAL
  Filled 2021-02-06 (×14): qty 1

## 2021-02-06 MED ORDER — QUETIAPINE FUMARATE 100 MG PO TABS
100.0000 mg | ORAL_TABLET | Freq: Every day | ORAL | Status: DC
  Administered 2021-02-06 – 2021-02-19 (×14): 100 mg via ORAL
  Filled 2021-02-06 (×14): qty 1

## 2021-02-06 MED ORDER — IPRATROPIUM-ALBUTEROL 0.5-2.5 (3) MG/3ML IN SOLN
3.0000 mL | RESPIRATORY_TRACT | Status: AC | PRN
  Administered 2021-02-06 – 2021-02-07 (×2): 3 mL via RESPIRATORY_TRACT
  Filled 2021-02-06 (×2): qty 3

## 2021-02-06 NOTE — Progress Notes (Signed)
[facility] Progress Note Patient Name: Jean Kramer DOB: 02/03/1951 MRN: [id number]   Date of Service  02/06/2021  HPI/Events of Note  Patient has a history of COPD and is wheezing.  eICU Interventions  Duonebs Rx Q 4 hours PRN wheezing ordered.        Kerry Kass Marchele Decock 02/06/2021, 6:21 AM

## 2021-02-06 NOTE — Progress Notes (Signed)
Requesting duonebs Q4prn from Dr. Lucile Shutters via Soyla Murphy RN.

## 2021-02-06 NOTE — Progress Notes (Signed)
Progress Note   Subjective  Patient states she is feeling better this AM. She had some wheezing overnight thought to be due to COPD and given nebulizer. Nursing denies any melena or bleeding symptoms overnight. She received 4 units of RBC and Hgb went from 4s to 10s. BUN downtrending. Received 2 units of platelets but last checked level was still 9. She denies any pain.   Objective   Vital signs in last 24 hours: Temp:  [97.6 F (36.4 C)-99.2 F (37.3 C)] 97.9 F (36.6 C) (07/21 0739) Pulse Rate:  [90-110] 95 (07/21 0700) Resp:  [15-34] 25 (07/21 0700) BP: (50-166)/(20-135) 106/79 (07/21 0700) SpO2:  [93 %-100 %] 99 % (07/21 0700) Weight:  [84.5 kg-84.8 kg] 84.8 kg (07/21 0248) Last BM Date: 02/06/21 General:    white female in NAD Heart:  Regular rate and rhythm Lungs: Respirations even and unlabored Abdomen:  Soft, nontender and nondistended.  Extremities:  Without edema. Neurologic:  Alert and oriented,  grossly normal neurologically. Psych:  Cooperative. Normal mood and affect.  Intake/Output from previous day: 07/20 0701 - 07/21 0700 In: 2421.6 [I.V.:1065; Blood:315; IV Piggyback:1041.5] Out: 150 [Urine:150] Intake/Output this shift: No intake/output data recorded.  Lab Results: Recent Labs    02/05/21 1403 02/05/21 1821 02/06/21 0156  WBC 34.1*  --  28.2*  HGB 4.0* 9.9* 10.3*  HCT 12.5* 30.1* 30.0*  PLT 5*  --  9*   BMET Recent Labs    02/05/21 1350 02/05/21 1403 02/06/21 0156  NA 141 141 142  K 3.9 3.8 3.5  CL 110 113* 112*  CO2  --  17* 21*  GLUCOSE 146* 152* 152*  BUN 41* 42* 26*  CREATININE 0.90 1.14* 0.85  CALCIUM  --  7.9* 8.4*   LFT Recent Labs    02/06/21 0156  PROT 5.8*  ALBUMIN 2.9*  AST 42*  ALT 41  ALKPHOS 58  BILITOT 0.7   PT/INR Recent Labs    02/05/21 1403 02/06/21 0156  LABPROT 16.7* 14.9  INR 1.4* 1.2    Studies/Results: CT Head Wo Contrast  Result Date: 02/05/2021 CLINICAL DATA:  Headache, suspected  intracranial hemorrhage EXAM: CT HEAD WITHOUT CONTRAST TECHNIQUE: Contiguous axial images were obtained from the base of the skull through the vertex without intravenous contrast. COMPARISON:  03/31/2006 FINDINGS: Brain: No evidence of acute infarction, hemorrhage, hydrocephalus, extra-axial collection or mass lesion/mass effect. Mild atrophy. Vascular: Atherosclerotic and physiologic intracranial calcifications. Skull: Normal. Negative for fracture or focal lesion. Sinuses/Orbits: Dense opacification of the left maxillary sinus and partial opacification of left ethmoid air cells. Orbits unremarkable. Other: 2cm parietal scalp lesion near the midline and1 cm right parietooccipital scalp lesion, possibly sebaceous cyst but nonspecific. IMPRESSION: 1. No acute intracranial process. 2. Left maxillary and ethmoid sinus disease Electronically Signed   By: Lucrezia Europe M.D.   On: 02/05/2021 14:51       Assessment / Plan:   70 y/o female with history of ITP presenting with melena, severe anemia in the setting of NSAID use and platelet count of 5 on admission. WBC also in the 30s. She has no pain at all, no fevers. She was hypotensive / shock on admission which has improved, BP and HR look stable this AM following 4 units RBC transfusion and 2 units platelets. On IV PPI. Her Hgb has responded appropriately, BUN downtrending, she has no had any bleeding reported overnight, so stable from that perspective. Her platelet count remains at 9 after 2  units of platelets and dose of IV solumedrol. Hematology treating platelets with IV steroids and IVIG infusion.   She warrants an endoscopy to evaluate her upper tract, highest on ddx is peptic ulcer in the setting of significant NSAID use, question is timing. Her platelet count needs to be much higher, ideally > 50, prior to elective endoscopy. She is not actively bleeding right now, she has had symptoms of melena for weeks. Will continue PPI for now, await course with platelet  management. If no invasive plans for procedures today can give clear liquid diet if she is otherwise stable. I have discussed all of this with the patient who agrees. I have tentatively booked her for an endoscopy tomorrow to hold a spot for her in case she needs it, but it depends on her course and platelet count. Will follow. Leukocytosis noted and slightly improved, could be reactive, consider CT abdomen / pelvis.  Recommend: - continue IV protonix - monitor Hgb and for recurrent bleeding - repeat CBC this AM - management of ITP per hematology - steroids and IVIG, she needs another platelet transfusion and counts higher prior to endoscopy - if no plans for endoscopy / invasive procedures otherwise today, can place on clear liquid diet and then NPO after MN for possible endoscopy tomorrow  Call with questions or changes in her status, will follow.  Jolly Mango, MD [facility]

## 2021-02-06 NOTE — Progress Notes (Addendum)
NAME:  Jean Kramer, MRN:  [id number], DOB:  May 27, 1951, LOS: 0 ADMISSION DATE:  02/05/2021, CONSULTATION DATE:  02/05/21  REFERRING MD:  Sherwood Gambler, MD, CHIEF COMPLAINT:  thrombocytopenia, weakness, hypotension   History of Present Illness:  Jean Kramer is a 70 year old female who presents for 3 weeks of progressive weakness and black stools. States she noticed petechial rash on legs 3 weeks ago and since spread to her arms, chest and back. Notes associated fatigue, lightheadedness, and shortness of breath. Has been having difficulty walking to and from bathroom and feel 2 nights ago. Denies hitting her head or LOC. Since her symptoms started she has been having daily black stools. Has not had any bright red stools. Has had a poor appetite and one episodes of vomiting yesterday night. She is unsure if there was any blood when she threw up. This morning woke up with liquid dark stools and worsening lightheadedness.   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 Has been in remission since then. She stakes daily goody powder and BC powder for headaches. Does not taker her aspirin due to this. Was also started on levothyroxine about a month ago. Otherwise denies any other medication changes or recent illness.   She denies fever, chest pain, dyspnea, hematemesis, brbpr, nausea, vomiting, or abdominal pain.     In ED patin presented with hypotension to the 80s and tachycardia. Initially H&H of 6.5 and then 4 on CBC. Leukocytosis of 34 and platelet count of 5. Patient received IVF, solumedrol, 2 units pRBCs, and 1 unit of platelets.  GI consulted for bleeding and hematology consulted for thrombocytopenia. BP remain low after fluids and ICU was consulted for admission.    Pertinent  Medical History  ITP sp splenectomy x2 most recently in 2000, COPD, stroke  Significant Hospital  Events: Including procedures, antibiotic start and stop dates in addition to other pertinent events   7/21 admitted to ICU  Interim History / Subjective:  Patient awake this morning, states weakness improved. Had a BM overnight, denies blood or melena.   Objective   Blood pressure 106/79, pulse 95, temperature 98 F (36.7 C), temperature source Oral, resp. rate (!) 25, weight 84.8 kg, SpO2 99 %.        Intake/Output Summary (Last 24 hours) at 02/06/2021 0737 Last data filed at 02/06/2021 0700 Gross per 24 hour  Intake 2421.55 ml  Output 150 ml  Net 2271.55 ml   Filed Weights   02/05/21 1949 02/06/21 0248  Weight: 84.5 kg 84.8 kg    Examination: General: Awake, laying in bed in no acute distress HENT: Pupil equal and reactive to light, dry mucous membranes  Lungs: CTA bilaterally, no wheezing, no accessory muscle use  Cardiovascular: Regular rate and rhythm, no murmurs, gallops or rubs Abdomen: soft, nontender, nondistended, no hepatosplenomegaly Extremities: warm, palpable radial and DP pulses Neuro: Aox4, no focal weakness Skin: Purpuric rash along bilateral LE, UE, chest and back, scattered bruising predominantly on upper and lower extremities,   Resolved Hospital Problem list     Assessment & Plan:  Hemorrhagic shock like due to upper GI bleed BP remain low in 80s/50s, no further signs of bleeding.  Hemoglobin improved to 10.5 post transfusion. On low dose of levo overnight prior to losing IV. Appears to perfusing well on exam despite low BP readings -Norepinephrine as needed to maintain  MAPs >65 -LR 500 mL bolus -Repeat CBC, lactic acid consider a-line if labs are stable -transfuse as needed for hgb <7 - Monitor vitals   Thrombocytopenia Presented with platelets of 5 improved to 9 after platelet infusion x2 Likely relapse in ITP.  - Hematology consulted, appreciate recommendations - Dexamethasone 10 mg daily - transfuse platelets 2 units today - IVIG 1mg /kg for 2  days -Consider CT abdomen pelvis to evaluate for accessory spleen - Monitor platelets    Anemia likely secondary to GI bleed No further melena or other signs of bleeding. Hgb 10.5 this morning. - GI consulted appreciate recommendations - Repeat CBC this morning - Protonix 40 mg twice daily - Tentative plan for upper endoscopy 7/22 per GI, would recommend platelet infusion during endoscopy if platelets remain low - Transfuse as needed for hgb<7   Leukocytosis WBC of 34,>28 likely reactive - continue to monitor monitor CBC   COPD Mild wheezing this morning improved with duonebs. Wheezing much improved on exam this morning. - dulera twice daily  -Duoned PRN q4h   Best Practice (right click and "Reselect all SmartList    Best Practice (right click and "Reselect all SmartList Selections" daily)   Diet/type: clear liquids DVT prophylaxis: SCD GI prophylaxis: PPI Lines: N/A Foley:  N/A Code Status:  full code Last date of multidisciplinary goals of care discussion [ n.a]  Labs   CBC: Recent Labs  Lab 02/05/21 1350 02/05/21 1403 02/05/21 1821 02/06/21 0156  WBC  --  34.1*  --  28.2*  NEUTROABS  --  23.8*  --  21.9*  HGB 6.5* 4.0* 9.9* 10.3*  HCT 19.0* 12.5* 30.1* 30.0*  MCV  --  114.7*  --  96.5  PLT  --  5*  --  9*    Basic Metabolic Panel: Recent Labs  Lab 02/05/21 1350 02/05/21 1403 02/06/21 0156  NA 141 141 142  K 3.9 3.8 3.5  CL 110 113* 112*  CO2  --  17* 21*  GLUCOSE 146* 152* 152*  BUN 41* 42* 26*  CREATININE 0.90 1.14* 0.85  CALCIUM  --  7.9* 8.4*   GFR: CrCl cannot be calculated (Unknown ideal weight.). Recent Labs  Lab 02/05/21 1341 02/05/21 1403 02/05/21 1541 02/06/21 0156  WBC  --  34.1*  --  28.2*  LATICACIDVEN 5.0*  --  4.4*  --     Liver Function Tests: Recent Labs  Lab 02/05/21 1403 02/06/21 0156  AST 41 42*  ALT 43 41  ALKPHOS 47 58  BILITOT 0.6 0.7  PROT 4.9* 5.8*  ALBUMIN 2.3* 2.9*   No results for input(s): LIPASE,  AMYLASE in the last 168 hours. No results for input(s): AMMONIA in the last 168 hours.  ABG    Component Value Date/Time   TCO2 18 (L) 02/05/2021 1350     Coagulation Profile: Recent Labs  Lab 02/05/21 1403 02/06/21 0156  INR 1.4* 1.2    Cardiac Enzymes: No results for input(s): CKTOTAL, CKMB, CKMBINDEX, TROPONINI in the last 168 hours.  HbA1C: No results found for: HGBA1C  CBG: No results for input(s): GLUCAP in the last 168 hours.  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   Surgical History:  History reviewed. No pertinent surgical history.   Social History:    Patient from home, smoke 1 ppd. 50 pack year history. Denies alcohol or other drug use  Family History:  Her family history is not on file.   Allergies Not on File   Home Medications  Prior to Admission medications   Medication Sig Start Date End Date Taking? Authorizing Provider  alendronate (FOSAMAX) 70 MG tablet Take 70 mg by mouth once a week. 12/06/20   [provider]  ALPRAZolam Duanne Moron) 1 MG tablet Take 0.5-1 mg by mouth daily as needed for anxiety. 01/16/21   [provider]  aspirin 81 MG EC tablet Take 81 mg by mouth daily.    [provider]  budesonide-formoterol (SYMBICORT) 80-4.5 MCG/ACT inhaler Inhale 2 puffs into the lungs 2 (two) times daily.    [provider]  busPIRone (BUSPAR) 15 MG tablet Take 15 mg by mouth 2 (two) times daily. 12/03/20   [provider]  calcium-vitamin D (OSCAL WITH D) 500-200 MG-UNIT TABS tablet Take 1 tablet by mouth 2 (two) times daily with a meal.    [provider]  escitalopram (LEXAPRO) 20 MG tablet Take 20 mg by mouth daily. 11/04/20   [provider]  furosemide (LASIX) 20 MG tablet Take 20 mg by mouth daily. 01/16/21   [provider]  levothyroxine (SYNTHROID) 25 MCG tablet  Take 25 mcg by mouth every morning. 11/20/20   [provider]  LINZESS 290 MCG CAPS capsule Take 290 mcg by mouth daily. 11/15/20   [provider]  nystatin (MYCOSTATIN/NYSTOP) powder Apply 1 application topically 3 (three) times daily. 12/30/20   [provider]  Oxycodone HCl 20 MG TABS Take 1 tablet by mouth every 8 (eight) hours as needed for severe pain. 01/16/21   [provider]  pramipexole (MIRAPEX) 0.5 MG tablet Take 0.5 mg by mouth at bedtime. 12/06/20   [provider]  QUEtiapine (SEROQUEL) 200 MG tablet Take 200 mg by mouth at bedtime. 02/01/21   [provider]  rOPINIRole (REQUIP) 0.5 MG tablet Take 0.5 mg by mouth 3 (three) times daily.    [provider]  simvastatin (ZOCOR) 40 MG tablet Take 40 mg by mouth at bedtime. 01/16/21   [provider]    Iona Beard, MD Internal Medicine, PGY-2 02/06/21 10:41 AM

## 2021-02-06 NOTE — H&P (Deleted)
NAME:  Jean Kramer, MRN:  [id number], DOB:  1951/04/24, LOS: 1 ADMISSION DATE:  02/05/2021, CONSULTATION DATE:  02/06/21  REFERRING MD:  Sherwood Gambler, MD, CHIEF COMPLAINT:  thrombocytopenia, weakness, hypotension   History of Present Illness:  Jean Kramer is a 70 year old female who presents for 3 weeks of progressive weakness and black stools. States she noticed petechial rash on legs 3 weeks ago and since spread to her arms, chest and back. Notes associated fatigue, lightheadedness, and shortness of breath. Has been having difficulty walking to and from bathroom and feel 2 nights ago. Denies hitting her head or LOC. Since her symptoms started she has been having daily black stools. Has not had any bright red stools. Has had a poor appetite and one episodes of vomiting yesterday night. She is unsure if there was any blood when she threw up. This morning woke up with liquid dark stools and worsening lightheadedness.   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 Has been in remission since then. She stakes daily goody powder and BC powder for headaches. Does not taker her aspirin due to this. Was also started on levothyroxine about a month ago. Otherwise denies any other medication changes or recent illness.  She denies fever, chest pain, dyspnea, hematemesis, brbpr, nausea, vomiting, or abdominal pain.    In ED patin presented with hypotension to the 80s and tachycardia. Initially H&H of 6.5 and then 4 on CBC. Leukocytosis of 34 and platelet count of 5. Patient received IVF, solumedrol, 2 units pRBCs, and 1 unit of platelets.  GI consulted for bleeding and hematology consulted for thrombocytopenia. BP remain low after fluids and ICU was consulted for admission.  Pertinent  Medical History  ITP sp splenectomy x2 most recently in 2000, COPD, stroke  Significant Hospital  Events: Including procedures, antibiotic start and stop dates in addition to other pertinent events     Interim History / Subjective:  Patient sp 2 units of blood 1L NS, solumedrol. Continues to feel generalized weakness. BP improved to 110s/50 while in room.   Objective   Blood pressure 106/79, pulse 95, temperature 98 F (36.7 C), temperature source Oral, resp. rate (!) 25, weight 84.8 kg, SpO2 99 %.        Intake/Output Summary (Last 24 hours) at 02/06/2021 0734 Last data filed at 02/06/2021 0700 Gross per 24 hour  Intake 2421.55 ml  Output 150 ml  Net 2271.55 ml    Filed Weights   02/05/21 1949 02/06/21 0248  Weight: 84.5 kg 84.8 kg    Examination: General: Tired appear woman, laying in bed in no acute distress HENT: Normocephalic, dry mucous membranes  Lungs: Diffuse wheezing bilaterally, no accessory muscle use  Cardiovascular: Regular rate and rhythm, no murmurs, gallops or rubs Abdomen: soft, nontender, nondistended, no hepatosplenomegaly Extremities: Cool, distal pulses intact Neuro: Aox4, no focal weakness Skin: Purpuric rash along bilateral LE, UE, chest and back, scattered bruising predominantly on upper and lower extremities as well as chest   CT head without acute abnormalities  Resolved Hospital Problem list     Assessment & Plan:  Hemorrhagic shock like due to upper GI bleed Hypotension improving with 1L NS and 2 units pRBCs. Appears dry on exam. No need for pressors at this time -Transfuse pRBCs, FFP, platelets  -post transfusion H&H - Monitor vitals   Thrombocytopenia Presented with platelets  of 5. Likely relapse in ITP. S/p Solumedrol, 2 units RBC in ED.  - Hematology consulted, appreciate recommendations - Continue steroids  -Transfuse to maintain platelets >50 - IVIG per hematology -Follow up hepatitis panel -Consider CT abdomen pelvis to evaluate for accessory spleen - CBC in morning  Anemia likely secondary to GI bleed Patient with  melenotic Stool likely in setting of upper GI bleed due to daily NSAID use and thrombocytopenia.  Sp 2 units pRBCs - GI consulted appreciate recommendations - Transfuse pRBCs - Check post transfusion hemoglobin - Protonix infusion - NPO - Upper endoscopy per GI    Leukocytosis  WBC of 34, does not appear infected. May be reactive in setting of gi bleed and thrombocytopenia. - monitor CBC  COPD Diffuse wheezing on exam. Denies increase wheezing, cough, or sputum. On Symbicort at home - dulera twice daily   Best Practice (right click and "Reselect all SmartList Selections" daily)  Diet/type: NPO DVT prophylaxis: SCD GI prophylaxis: PPI Lines: N/A Foley:  N/A Code Status:  full code Last date of multidisciplinary goals of care discussion [n/a]  Labs   CBC: Recent Labs  Lab 02/05/21 1350 02/05/21 1403 02/05/21 1821 02/06/21 0156  WBC  --  34.1*  --  28.2*  NEUTROABS  --  23.8*  --  21.9*  HGB 6.5* 4.0* 9.9* 10.3*  HCT 19.0* 12.5* 30.1* 30.0*  MCV  --  114.7*  --  96.5  PLT  --  5*  --  9*     Basic Metabolic Panel: Recent Labs  Lab 02/05/21 1350 02/05/21 1403 02/06/21 0156  NA 141 141 142  K 3.9 3.8 3.5  CL 110 113* 112*  CO2  --  17* 21*  GLUCOSE 146* 152* 152*  BUN 41* 42* 26*  CREATININE 0.90 1.14* 0.85  CALCIUM  --  7.9* 8.4*    GFR: CrCl cannot be calculated (Unknown ideal weight.). Recent Labs  Lab 02/05/21 1341 02/05/21 1403 02/05/21 1541 02/06/21 0156  WBC  --  34.1*  --  28.2*  LATICACIDVEN 5.0*  --  4.4*  --      Liver Function Tests: Recent Labs  Lab 02/05/21 1403 02/06/21 0156  AST 41 42*  ALT 43 41  ALKPHOS 47 58  BILITOT 0.6 0.7  PROT 4.9* 5.8*  ALBUMIN 2.3* 2.9*    No results for input(s): LIPASE, AMYLASE in the last 168 hours. No results for input(s): AMMONIA in the last 168 hours.  ABG    Component Value Date/Time   TCO2 18 (L) 02/05/2021 1350      Coagulation Profile: Recent Labs  Lab 02/05/21 1403  02/06/21 0156  INR 1.4* 1.2     Review of Systems:   Review of Systems  Constitutional:  Negative for chills and fever.  HENT:  Negative for nosebleeds and sore throat.   Respiratory:  Positive for shortness of breath. Negative for cough and wheezing.   Cardiovascular:  Negative for chest pain and palpitations.  Gastrointestinal:  Positive for melena and vomiting. Negative for abdominal pain and nausea.  Genitourinary:  Negative for dysuria and frequency.  Skin:  Positive for rash.  Neurological:  Positive for dizziness. Negative for focal weakness and loss of consciousness.  Endo/Heme/Allergies:  Bruises/bleeds easily.    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   Surgical History:  History reviewed. No pertinent surgical history.   Social  History:   Lives at home, smokes 1 pack a day with 50 pack year history, denies alcohol or other drug use  Family History:  Her family history is not on file.   Allergies Not on File   Home Medications  Prior to Admission medications   Medication Sig Start Date End Date Taking? Authorizing Provider  alendronate (FOSAMAX) 70 MG tablet Take 70 mg by mouth once a week. 12/06/20   [provider]  ALPRAZolam Duanne Moron) 1 MG tablet Take 0.5-1 mg by mouth daily as needed for anxiety. 01/16/21   [provider]  aspirin 81 MG EC tablet Take 81 mg by mouth daily.    [provider]  budesonide-formoterol (SYMBICORT) 80-4.5 MCG/ACT inhaler Inhale 2 puffs into the lungs 2 (two) times daily.    [provider]  busPIRone (BUSPAR) 15 MG tablet Take 15 mg by mouth 2 (two) times daily. 12/03/20   [provider]  calcium-vitamin D (OSCAL WITH D) 500-200 MG-UNIT TABS tablet Take 1 tablet by mouth 2 (two) times daily with a meal.    [provider]  escitalopram (LEXAPRO) 20 MG tablet Take 20  mg by mouth daily. 11/04/20   [provider]  furosemide (LASIX) 20 MG tablet Take 20 mg by mouth daily. 01/16/21   [provider]  levothyroxine (SYNTHROID) 25 MCG tablet Take 25 mcg by mouth every morning. 11/20/20   [provider]  LINZESS 290 MCG CAPS capsule Take 290 mcg by mouth daily. 11/15/20   [provider]  nystatin (MYCOSTATIN/NYSTOP) powder Apply 1 application topically 3 (three) times daily. 12/30/20   [provider]  Oxycodone HCl 20 MG TABS Take 1 tablet by mouth every 8 (eight) hours as needed for severe pain. 01/16/21   [provider]  pramipexole (MIRAPEX) 0.5 MG tablet Take 0.5 mg by mouth at bedtime. 12/06/20   [provider]  QUEtiapine (SEROQUEL) 200 MG tablet Take 200 mg by mouth at bedtime. 02/01/21   [provider]  rOPINIRole (REQUIP) 0.5 MG tablet Take 0.5 mg by mouth 3 (three) times daily.    [provider]  simvastatin (ZOCOR) 40 MG tablet Take 40 mg by mouth at bedtime. 01/16/21   [provider]     Iona Beard, MD Internal Medicine, PGY-2 02/06/21 7:34 AM

## 2021-02-06 NOTE — Progress Notes (Signed)
Events noted overnight and laboratory data reviewed.  Hemoglobin appears to improve to with appropriate transfusion and resuscitation and no additional transfusion is needed at this time.  Platelet count currently at 9000 I would recommend repeat platelet transfusion given her GI bleeding.  In the meantime she will continue daily dexamethasone 10 mg.  This could be converted to oral prednisone once the patient is able and allowed to have oral medication.  She will receive IVIG treatment daily for 2 days to expedite her platelet recovery given her hemorrhagic shock.  Will continue to follow.

## 2021-02-07 DIAGNOSIS — D62 Acute posthemorrhagic anemia: Secondary | ICD-10-CM | POA: Diagnosis not present

## 2021-02-07 DIAGNOSIS — D693 Immune thrombocytopenic purpura: Secondary | ICD-10-CM | POA: Diagnosis not present

## 2021-02-07 LAB — COMPREHENSIVE METABOLIC PANEL
ALT: 44 U/L (ref 0–44)
AST: 45 U/L — ABNORMAL HIGH (ref 15–41)
Albumin: 2.6 g/dL — ABNORMAL LOW (ref 3.5–5.0)
Alkaline Phosphatase: 44 U/L (ref 38–126)
Anion gap: 5 (ref 5–15)
BUN: 22 mg/dL (ref 8–23)
CO2: 23 mmol/L (ref 22–32)
Calcium: 7.8 mg/dL — ABNORMAL LOW (ref 8.9–10.3)
Chloride: 109 mmol/L (ref 98–111)
Creatinine, Ser: 0.88 mg/dL (ref 0.44–1.00)
GFR, Estimated: >60 mL/min (ref >60)
Glucose, Bld: 121 mg/dL — ABNORMAL HIGH (ref 70–99)
Potassium: 3.5 mmol/L (ref 3.5–5.1)
Sodium: 137 mmol/L (ref 135–145)
Total Bilirubin: 0.9 mg/dL (ref 0.3–1.2)
Total Protein: 8.6 g/dL — ABNORMAL HIGH (ref 6.5–8.1)

## 2021-02-07 LAB — CBC
HCT: 27.4 % — ABNORMAL LOW (ref 36.0–46.0)
Hemoglobin: 9.1 g/dL — ABNORMAL LOW (ref 12.0–15.0)
MCH: 33.5 pg (ref 26.0–34.0)
MCHC: 33.2 g/dL (ref 30.0–36.0)
MCV: 100.7 fL — ABNORMAL HIGH (ref 80.0–100.0)
Platelets: 29 10*3/uL — CL (ref 150–400)
RBC: 2.72 MIL/uL — ABNORMAL LOW (ref 3.87–5.11)
RDW: 22 % — ABNORMAL HIGH (ref 11.5–15.5)
WBC: 24.8 10*3/uL — ABNORMAL HIGH (ref 4.0–10.5)
nRBC: 3.2 % — ABNORMAL HIGH (ref 0.0–0.2)

## 2021-02-07 LAB — PATHOLOGIST SMEAR REVIEW: Path Review: INCREASED

## 2021-02-07 MED ORDER — OXYCODONE HCL 5 MG PO TABS
10.0000 mg | ORAL_TABLET | Freq: Once | ORAL | Status: AC
  Administered 2021-02-07: 10 mg via ORAL
  Filled 2021-02-07: qty 2

## 2021-02-07 MED ORDER — LIDOCAINE 5 % EX PTCH
1.0000 | MEDICATED_PATCH | CUTANEOUS | Status: DC
  Administered 2021-02-07 – 2021-02-19 (×12): 1 via TRANSDERMAL
  Filled 2021-02-07 (×13): qty 1

## 2021-02-07 MED ORDER — POTASSIUM CHLORIDE CRYS ER 20 MEQ PO TBCR
40.0000 meq | EXTENDED_RELEASE_TABLET | Freq: Once | ORAL | Status: AC
  Administered 2021-02-07: 40 meq via ORAL
  Filled 2021-02-07: qty 2

## 2021-02-07 MED ORDER — PREDNISONE 50 MG PO TABS
60.0000 mg | ORAL_TABLET | Freq: Every day | ORAL | Status: DC
  Administered 2021-02-08 – 2021-02-20 (×13): 60 mg via ORAL
  Filled 2021-02-07 (×11): qty 1
  Filled 2021-02-07: qty 3
  Filled 2021-02-07: qty 1

## 2021-02-07 MED ORDER — OXYCODONE HCL 5 MG PO TABS
10.0000 mg | ORAL_TABLET | ORAL | Status: DC | PRN
  Administered 2021-02-07 – 2021-02-20 (×54): 10 mg via ORAL
  Filled 2021-02-07 (×56): qty 2

## 2021-02-07 NOTE — Progress Notes (Addendum)
HEMATOLOGY-ONCOLOGY PROGRESS NOTE  SUBJECTIVE: No melena, hematochezia, or other bleeding reported. Reports right shoulder pain.  REVIEW OF SYSTEMS:   Constitutional: Denies fevers, chills Eyes: Denies blurriness of vision Ears, nose, mouth, throat, and face: Denies mucositis or sore throat Respiratory: Denies cough, dyspnea or wheezes Cardiovascular: Denies palpitation, chest discomfort Gastrointestinal:  Denies nausea, heartburn or change in bowel habits Skin: Denies abnormal skin rashes Neurological:Denies numbness, tingling or new weaknesses Behavioral/Psych: Mood is stable, no new changes  Extremities: No lower extremity edema All other systems were reviewed with the patient and are negative.  I have reviewed the past medical history, past surgical history, social history and family history with the patient and they are unchanged from previous note.   PHYSICAL EXAMINATION:  Vitals:   02/07/21 0730 02/07/21 0733  BP:    Pulse:    Resp:    Temp:  97.7 F (36.5 C)  SpO2: 97% 97%   Filed Weights   02/05/21 1949 02/06/21 0248 02/07/21 0500  Weight: 84.5 kg 84.8 kg 84.8 kg    Intake/Output from previous day: 07/21 0701 - 07/22 0700 In: 72.5 [I.V.:72.5] Out: 925 [Urine:925]  GENERAL:alert, no distress and comfortable SKIN: Multiple ecchymoses and petechiae noted her extremities EYES: normal, Conjunctiva are pink and non-injected, sclera clear OROPHARYNX:no exudate, no erythema and lips, buccal mucosa, and tongue normal  LUNGS: clear to auscultation and percussion with normal breathing effort HEART: regular rate & rhythm and no murmurs and no lower extremity edema ABDOMEN:abdomen soft, non-tender and normal bowel sounds NEURO: alert & oriented x 3 with fluent speech, no focal motor/sensory deficits  LABORATORY DATA:  I have reviewed the data as listed CMP Latest Ref Rng & Units 02/07/2021 02/06/2021 02/05/2021  Glucose 70 - 99 mg/dL 121(H) 152(H) 152(H)  BUN 8 - 23  mg/dL 22 26(H) 42(H)  Creatinine 0.44 - 1.00 mg/dL 0.88 0.85 1.14(H)  Sodium 135 - 145 mmol/L 137 142 141  Potassium 3.5 - 5.1 mmol/L 3.5 3.5 3.8  Chloride 98 - 111 mmol/L 109 112(H) 113(H)  CO2 22 - 32 mmol/L 23 21(L) 17(L)  Calcium 8.9 - 10.3 mg/dL 7.8(L) 8.4(L) 7.9(L)  Total Protein 6.5 - 8.1 g/dL 8.6(H) 5.8(L) 4.9(L)  Total Bilirubin 0.3 - 1.2 mg/dL 0.9 0.7 0.6  Alkaline Phos 38 - 126 U/L 44 58 47  AST 15 - 41 U/L 45(H) 42(H) 41  ALT 0 - 44 U/L 44 41 43    Lab Results  Component Value Date   WBC 24.8 (H) 02/07/2021   HGB 9.1 (L) 02/07/2021   HCT 27.4 (L) 02/07/2021   MCV 100.7 (H) 02/07/2021   PLT 29 (LL) 02/07/2021   NEUTROABS 21.9 (H) 02/06/2021    CT Head Wo Contrast  Result Date: 02/05/2021 CLINICAL DATA:  Headache, suspected intracranial hemorrhage EXAM: CT HEAD WITHOUT CONTRAST TECHNIQUE: Contiguous axial images were obtained from the base of the skull through the vertex without intravenous contrast. COMPARISON:  03/31/2006 FINDINGS: Brain: No evidence of acute infarction, hemorrhage, hydrocephalus, extra-axial collection or mass lesion/mass effect. Mild atrophy. Vascular: Atherosclerotic and physiologic intracranial calcifications. Skull: Normal. Negative for fracture or focal lesion. Sinuses/Orbits: Dense opacification of the left maxillary sinus and partial opacification of left ethmoid air cells. Orbits unremarkable. Other: 2cm parietal scalp lesion near the midline and1 cm right parietooccipital scalp lesion, possibly sebaceous cyst but nonspecific. IMPRESSION: 1. No acute intracranial process. 2. Left maxillary and ethmoid sinus disease Electronically Signed   By: Lucrezia Europe M.D.   On: 02/05/2021 14:51  ASSESSMENT AND PLAN: 70 year old woman with:  1.  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  She has followed in the past with Dr. Hinton Rao at [facility].  Her  laboratory data and peripheral smear were reviewed and these findings are consistent with acute ITP causing severe GI bleeding.  She has received 2 units of platelets this admission. Last given on 02/05/21.  Remains on dexamethasone 10 mg IV daily.  Status post 2 doses of IVIG 1 g/kg given on 7/21 and 7/22.  Platelet count today is 29,000 and she denies any active bleeding.  Can hold off on platelet transfusion today.  Continue steroids.   2.  Leukocytosis: This appears to be reactive at this time without any evidence of a hematological disorder.   3.  Anemia: Related to GI bleeding and supportive transfusion per the primary team to keep her hemodynamics stable.  GI planning for endoscopy this weekend pending improving platelet count.   LOS: 2 days   Mikey Bussing, DNP, AGPCNP-BC, AOCNP 02/07/21   Laboratory data personally reviewed.  Platelet count improved after steroids as well as IVIG.  I recommended continuing with the current regimen including daily steroids and continue day 2 of IVIG on 02/07/2021.  Her steroids can be switched to oral prednisone with equivalent to 1 mg/kg total dose daily if she is clinically stable and able to take p.o. regularly.  I recommended holding of on any transfusion unless active bleeding is noted.  We will continue to follow.  Zola Button MD 02/07/2021

## 2021-02-07 NOTE — Progress Notes (Signed)
NAME:  Jean Kramer, MRN:  [id number], DOB:  1951/06/18, LOS: 0 ADMISSION DATE:  02/05/2021, CONSULTATION DATE:  02/05/21  REFERRING MD:  Sherwood Gambler, MD, CHIEF COMPLAINT:  thrombocytopenia, weakness, hypotension   History of Present Illness:  Ms. Jean Kramer is a 70 year old female who presents for 3 weeks of progressive weakness and black stools. States she noticed petechial rash on legs 3 weeks ago and since spread to her arms, chest and back. Notes associated fatigue, lightheadedness, and shortness of breath. Has been having difficulty walking to and from bathroom and feel 2 nights ago. Denies hitting her head or LOC. Since her symptoms started she has been having daily black stools. Has not had any bright red stools. Has had a poor appetite and one episodes of vomiting yesterday night. She is unsure if there was any blood when she threw up. This morning woke up with liquid dark stools and worsening lightheadedness.   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 Has been in remission since then. She stakes daily goody powder and BC powder for headaches. Does not taker her aspirin due to this. Was also started on levothyroxine about a month ago. Otherwise denies any other medication changes or recent illness.   She denies fever, chest pain, dyspnea, hematemesis, brbpr, nausea, vomiting, or abdominal pain.     In ED patin presented with hypotension to the 80s and tachycardia. Initially H&H of 6.5 and then 4 on CBC. Leukocytosis of 34 and platelet count of 5. Patient received IVF, solumedrol, 2 units pRBCs, and 1 unit of platelets.  GI consulted for bleeding and hematology consulted for thrombocytopenia. BP remain low after fluids and ICU was consulted for admission.    Pertinent  Medical History  ITP sp splenectomy x2 most recently in 2000, COPD, stroke  Significant Hospital  Events: Including procedures, antibiotic start and stop dates in addition to other pertinent events   7/21 admitted to ICU  Interim History / Subjective:  Patient awake this morning, notes right shoulder pain that started overnight. Does not think she hit her shoulder prior to admission. Denies melena, abdominal pain, or blood bm.   Objective   Blood pressure (!) 89/59, pulse 82, temperature 98 F (36.7 C), temperature source Oral, resp. rate 13, weight 84.8 kg, SpO2 97 %.        Intake/Output Summary (Last 24 hours) at 02/07/2021 0721 Last data filed at 02/07/2021 0214 Gross per 24 hour  Intake 72.51 ml  Output 925 ml  Net -852.49 ml    Filed Weights   02/05/21 1949 02/06/21 0248 02/07/21 0500  Weight: 84.5 kg 84.8 kg 84.8 kg    Examination: General: Awake, laying in bed in no acute distress HENT: Pupil equal and reactive to light, dry mucous membranes  Lungs: CTA bilaterally, mild wheezing bilaterally, no accessory muscle use  Cardiovascular: Regular rate and rhythm, no murmurs, gallops or rubs Abdomen: soft, nontender, nondistended, no hepatosplenomegaly Extremities: warm, palpable radial and DP pulses Neuro: Aox4, no focal weakness Skin: Purpuric rash along bilateral LE, UE, chest and back, scattered bruising predominantly on upper and lower extremities  Resolved Hospital Problem list     Assessment & Plan:  Hemorrhagic shock like due to upper GI bleed Off pressors since this morning. BP stable. No further signs of bleeding. Hgb stable  -transfuse as needed for hgb <7 - Monitor vitals -  monitor CBC   Immune Thrombocytopenia Platelets improved to 29 this morning. No further signs of bleeding - Hematology consulted, appreciate recommendations - Dexamethasone 10 mg daily - IVIG '1mg'$ /kg for 2 days, last day 02/07/2021 - Monitor platelets    Anemia likely secondary to GI bleed No further melena or other signs of bleeding. Hgb 9.1 this morning. - GI consulted  appreciate recommendations - Protonix 40 mg twice daily - Upper endoscopy with platelets are improved, possibly tomorrow morning - NPO at midnight - Transfuse as needed for hgb<7   Leukocytosis WBC of 34>28>24 likely reactive also on dexamethasone  - continue to monitor monitor CBC   COPD - dulera twice daily  -Duoned PRN q4h   Right shoulder pain Appears to be MSK -lidocaine patch   Best Practice (right click and "Reselect all SmartList    Best Practice (right click and "Reselect all SmartList Selections" daily)   Diet/type: clear liquids and full liquids  DVT prophylaxis: SCD GI prophylaxis: PPI Lines: N/A Foley:  N/A Code Status:  full code Last date of multidisciplinary goals of care discussion [ n.a]  Labs   CBC: Recent Labs  Lab 02/05/21 1403 02/05/21 1821 02/06/21 0156 02/06/21 0912 02/06/21 1556 02/07/21 0327  WBC 34.1*  --  28.2* 25.8* 26.7* 24.8*  NEUTROABS 23.8*  --  21.9*  --   --   --   HGB 4.0* 9.9* 10.3* 9.3* 9.7* 9.1*  HCT 12.5* 30.1* 30.0* 27.4* 29.0* 27.4*  MCV 114.7*  --  96.5 98.2 99.7 100.7*  PLT 5*  --  9* 29* 32* 29*     Basic Metabolic Panel: Recent Labs  Lab 02/05/21 1350 02/05/21 1403 02/06/21 0156 02/07/21 0327  NA 141 141 142 137  K 3.9 3.8 3.5 3.5  CL 110 113* 112* 109  CO2  --  17* 21* 23  GLUCOSE 146* 152* 152* 121*  BUN 41* 42* 26* 22  CREATININE 0.90 1.14* 0.85 0.88  CALCIUM  --  7.9* 8.4* 7.8*    GFR: CrCl cannot be calculated (Unknown ideal weight.). Recent Labs  Lab 02/05/21 1341 02/05/21 1403 02/05/21 1541 02/06/21 0156 02/06/21 0912 02/06/21 1556 02/07/21 0327  WBC  --    < >  --  28.2* 25.8* 26.7* 24.8*  LATICACIDVEN 5.0*  --  4.4*  --  2.2*  --   --    < > = values in this interval not displayed.     Liver Function Tests: Recent Labs  Lab 02/05/21 1403 02/06/21 0156 02/07/21 0327  AST 41 42* 45*  ALT 43 41 44  ALKPHOS 47 58 44  BILITOT 0.6 0.7 0.9  PROT 4.9* 5.8* 8.6*  ALBUMIN 2.3*  2.9* 2.6*    No results for input(s): LIPASE, AMYLASE in the last 168 hours. No results for input(s): AMMONIA in the last 168 hours.  ABG    Component Value Date/Time   TCO2 18 (L) 02/05/2021 1350      Coagulation Profile: Recent Labs  Lab 02/05/21 1403 02/06/21 0156  INR 1.4* 1.2     Cardiac Enzymes: No results for input(s): CKTOTAL, CKMB, CKMBINDEX, TROPONINI in the last 168 hours.  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   Surgical History:  History reviewed. No pertinent surgical history.  Social History:    Patient from home, smoke 1 ppd. 50 pack year history. Denies alcohol or other drug use  Family History:  Her family history is not on file.   Allergies No Known Allergies   Home Medications  Prior to Admission medications   Medication Sig Start Date End Date Taking? Authorizing Provider  alendronate (FOSAMAX) 70 MG tablet Take 70 mg by mouth once a week. 12/06/20   [provider]  ALPRAZolam Duanne Moron) 1 MG tablet Take 0.5-1 mg by mouth daily as needed for anxiety. 01/16/21   [provider]  aspirin 81 MG EC tablet Take 81 mg by mouth daily.    [provider]  budesonide-formoterol (SYMBICORT) 80-4.5 MCG/ACT inhaler Inhale 2 puffs into the lungs 2 (two) times daily.    [provider]  busPIRone (BUSPAR) 15 MG tablet Take 15 mg by mouth 2 (two) times daily. 12/03/20   [provider]  calcium-vitamin D (OSCAL WITH D) 500-200 MG-UNIT TABS tablet Take 1 tablet by mouth 2 (two) times daily with a meal.    [provider]  escitalopram (LEXAPRO) 20 MG tablet Take 20 mg by mouth daily. 11/04/20   [provider]  furosemide (LASIX) 20 MG tablet Take 20 mg by mouth daily. 01/16/21   [provider]  levothyroxine (SYNTHROID) 25 MCG tablet Take 25 mcg by mouth every morning. 11/20/20   [provider]  LINZESS 290 MCG CAPS capsule Take 290 mcg by mouth daily. 11/15/20   [provider]  nystatin (MYCOSTATIN/NYSTOP) powder Apply 1 application topically 3 (three) times daily. 12/30/20   [provider]  Oxycodone HCl 20 MG TABS Take 1 tablet by mouth every 8 (eight) hours as needed for severe pain. 01/16/21   [provider]  pramipexole (MIRAPEX) 0.5 MG tablet Take 0.5 mg by mouth at bedtime. 12/06/20   [provider]  QUEtiapine (SEROQUEL) 200 MG tablet Take 200 mg by mouth at bedtime. 02/01/21   [provider]  rOPINIRole (REQUIP) 0.5 MG tablet Take 0.5 mg by mouth 3 (three) times daily.    [provider]  simvastatin (ZOCOR) 40 MG tablet Take 40 mg by mouth at bedtime. 01/16/21   [provider]    Iona Beard, MD Internal Medicine, PGY-2 02/07/21 7:21 AM

## 2021-02-07 NOTE — Progress Notes (Signed)
eLink Physician-Brief Progress Note Patient Name: Jean Kramer DOB: [birth year] MRN: [id number]   Date of Service  02/07/2021  HPI/Events of Note  Patient c/o chronic shoulder pain. Minimal relief with Oxycodone IR 10 mg PO Q 8 hours.  eICU Interventions  Plan: Oxycodone IR 10 mg PO X 1 now (extra dose).     Intervention Category Major Interventions: Other:  Lysle Dingwall 02/07/2021, 3:37 AM

## 2021-02-07 NOTE — Progress Notes (Signed)
Progress Note   Subjective  No melena overnight per nursing staff. She was on liquid diet yesterday and tolerating it. Denies abdominal pains, endorses R shoulder pain which is poorly controlled and her main complaint. Platelets at 29 this AM   Objective   Vital signs in last 24 hours: Temp:  [97.7 F (36.5 C)-98.5 F (36.9 C)] 97.7 F (36.5 C) (07/22 0733) Pulse Rate:  [82-104] 82 (07/22 0600) Resp:  [13-29] 13 (07/22 0600) BP: (76-107)/(43-83) 89/59 (07/22 0600) SpO2:  [96 %-100 %] 97 % (07/22 0733) Weight:  [84.8 kg] 84.8 kg (07/22 0500) Last BM Date: 02/06/21 General:    white female in NAD Abdomen:  Soft, nontender and nondistended.  Extremities:  Without edema. Neurologic:  Alert and oriented,  grossly normal neurologically. Psych:  Cooperative. Normal mood and affect.  Intake/Output from previous day: 07/21 0701 - 07/22 0700 In: 72.5 [I.V.:72.5] Out: 925 [Urine:925] Intake/Output this shift: No intake/output data recorded.  Lab Results: Recent Labs    02/06/21 0912 02/06/21 1556 02/07/21 0327  WBC 25.8* 26.7* 24.8*  HGB 9.3* 9.7* 9.1*  HCT 27.4* 29.0* 27.4*  PLT 29* 32* 29*   BMET Recent Labs    02/05/21 1403 02/06/21 0156 02/07/21 0327  NA 141 142 137  K 3.8 3.5 3.5  CL 113* 112* 109  CO2 17* 21* 23  GLUCOSE 152* 152* 121*  BUN 42* 26* 22  CREATININE 1.14* 0.85 0.88  CALCIUM 7.9* 8.4* 7.8*   LFT Recent Labs    02/07/21 0327  PROT 8.6*  ALBUMIN 2.6*  AST 45*  ALT 44  ALKPHOS 44  BILITOT 0.9   PT/INR Recent Labs    02/05/21 1403 02/06/21 0156  LABPROT 16.7* 14.9  INR 1.4* 1.2    Studies/Results: CT Head Wo Contrast  Result Date: 02/05/2021 CLINICAL DATA:  Headache, suspected intracranial hemorrhage EXAM: CT HEAD WITHOUT CONTRAST TECHNIQUE: Contiguous axial images were obtained from the base of the skull through the vertex without intravenous contrast. COMPARISON:  03/31/2006 FINDINGS: Brain: No evidence of acute  infarction, hemorrhage, hydrocephalus, extra-axial collection or mass lesion/mass effect. Mild atrophy. Vascular: Atherosclerotic and physiologic intracranial calcifications. Skull: Normal. Negative for fracture or focal lesion. Sinuses/Orbits: Dense opacification of the left maxillary sinus and partial opacification of left ethmoid air cells. Orbits unremarkable. Other: 2cm parietal scalp lesion near the midline and1 cm right parietooccipital scalp lesion, possibly sebaceous cyst but nonspecific. IMPRESSION: 1. No acute intracranial process. 2. Left maxillary and ethmoid sinus disease Electronically Signed   By: Lucrezia Europe M.D.   On: 02/05/2021 14:51       Assessment / Plan:    70 y/o female with history of ITP presenting with upper GI bleed /severe anemia in the setting of NSAID use and platelet count of 5 on admission. WBC also in the 30s, no pain or fevers. Treating ITP with steroids and IVIG. Her platelet count is up to 29 this AM. BUN has trended down, no significant melena, her bleeding has slowed with PPI alone and improvement in platelet count. Endoscopy pending improvement in platelet count.  Discussed with patient. Given platelets remain low and she is not actively bleeding, will delay endoscopy until platelets more improved. In the interim should she have significant rebleeding we can do endoscopy with platelet transfusion. I will tentatively hold a spot for EGD tomorrow AM, if platelets improved we can proceed, if she needs more time can do Sunday or early next week pending her course.  Spoke with Hematology and hopefully she has a nice response and platelets improve 48 hrs from start of therapy. Highest on ddx is PUD in the setting of NSAID use. Leukocytosis noted but she has no pain at all, no fevers. We may consider CT scan.   Recommend: - continue IV protonix - monitor Hgb / recurrent bleeding - endoscopy pending improvement in platelet count. Hopefully this weekend but will await her  course, keep NPO after MN in case we do it tomorrow, otherwise okay for clear liquids today - management of ITP per Hematology  Call with questions we will reassess her in the AM.  Jolly Mango, MD [facility]

## 2021-02-07 NOTE — Progress Notes (Signed)
[facility] ADULT ICU REPLACEMENT PROTOCOL   The patient does apply for the [facility] Adult ICU Electrolyte Replacment Protocol based on the criteria listed below:   1.Exclusion criteria: TCTS patients, ECMO patients and Hypothermia Protocol, and   Dialysis patients 2. Is GFR >/= 30 ml/min? Yes.    Patient's GFR today is >60 3. Is SCr </= 2? Yes.   Patient's SCr is 0.88 mg/dL 4. Did SCr increase >/= 0.5 in 24 hours? No. 5.Pt's weight >40kg  Yes.   6. Abnormal electrolyte(s):  K 3.5  7. Electrolytes replaced per protocol 8.  Call MD STAT for K+ </= 2.5, Phos </= 1, or Mag </= 1 Physician:  S. Rosie Fate R Gianfranco Araki 02/07/2021 5:24 AM

## 2021-02-08 ENCOUNTER — Inpatient Hospital Stay (HOSPITAL_COMMUNITY): Payer: Medicare Other

## 2021-02-08 ENCOUNTER — Inpatient Hospital Stay (HOSPITAL_COMMUNITY): Payer: Medicare Other | Admitting: Anesthesiology

## 2021-02-08 DIAGNOSIS — D62 Acute posthemorrhagic anemia: Secondary | ICD-10-CM | POA: Diagnosis not present

## 2021-02-08 DIAGNOSIS — D693 Immune thrombocytopenic purpura: Secondary | ICD-10-CM | POA: Diagnosis not present

## 2021-02-08 LAB — IMMATURE PLATELET FRACTION: Immature Platelet Fraction: 25.9 % — ABNORMAL HIGH (ref 1.2–8.6)

## 2021-02-08 LAB — CBC
HCT: 28.7 % — ABNORMAL LOW (ref 36.0–46.0)
Hemoglobin: 9.4 g/dL — ABNORMAL LOW (ref 12.0–15.0)
MCH: 33.6 pg (ref 26.0–34.0)
MCHC: 32.8 g/dL (ref 30.0–36.0)
MCV: 102.5 fL — ABNORMAL HIGH (ref 80.0–100.0)
Platelets: 32 10*3/uL — ABNORMAL LOW (ref 150–400)
RBC: 2.8 MIL/uL — ABNORMAL LOW (ref 3.87–5.11)
RDW: 21.8 % — ABNORMAL HIGH (ref 11.5–15.5)
WBC: 15.4 10*3/uL — ABNORMAL HIGH (ref 4.0–10.5)
nRBC: 3.1 % — ABNORMAL HIGH (ref 0.0–0.2)

## 2021-02-08 LAB — BASIC METABOLIC PANEL
Anion gap: 4 — ABNORMAL LOW (ref 5–15)
BUN: 22 mg/dL (ref 8–23)
CO2: 22 mmol/L (ref 22–32)
Calcium: 8.2 mg/dL — ABNORMAL LOW (ref 8.9–10.3)
Chloride: 108 mmol/L (ref 98–111)
Creatinine, Ser: 0.79 mg/dL (ref 0.44–1.00)
GFR, Estimated: >60 mL/min (ref >60)
Glucose, Bld: 107 mg/dL — ABNORMAL HIGH (ref 70–99)
Potassium: 4.6 mmol/L (ref 3.5–5.1)
Sodium: 134 mmol/L — ABNORMAL LOW (ref 135–145)

## 2021-02-08 LAB — VITAMIN B12: Vitamin B-12: 285 pg/mL (ref 180–914)

## 2021-02-08 LAB — IRON AND TIBC
Iron: 36 ug/dL (ref 28–170)
Saturation Ratios: 11 % (ref 10.4–31.8)
TIBC: 330 ug/dL (ref 250–450)
UIBC: 294 ug/dL

## 2021-02-08 LAB — FERRITIN: Ferritin: 137 ng/mL (ref 11–307)

## 2021-02-08 MED ORDER — ROMIPLOSTIM 250 MCG ~~LOC~~ SOLR
2.0000 ug/kg | SUBCUTANEOUS | Status: DC
  Administered 2021-02-08: 170 ug via SUBCUTANEOUS
  Filled 2021-02-08 (×2): qty 0.34

## 2021-02-08 NOTE — Progress Notes (Signed)
      Progress Note   Subjective  Patient doing well, no further bleeding symptoms. Hgb stable. Platelets remain low. She has no pain. Hungry.   Objective   Vital signs in last 24 hours: Temp:  [97.7 F (36.5 C)-98.2 F (36.8 C)] 98.1 F (36.7 C) (07/23 0345) Pulse Rate:  [73-92] 73 (07/23 0200) Resp:  [10-30] 10 (07/23 0200) BP: (55-126)/(40-90) 92/66 (07/23 0200) SpO2:  [94 %-99 %] 98 % (07/23 0200) Weight:  [84.8 kg] 84.8 kg (07/23 0500) Last BM Date: 02/06/21 General:    white female in NAD Abdomen:  Soft, nontender and nondistended.  Extremities:  Without edema. Neurologic:  Alert and oriented,  grossly normal neurologically. Psych:  Cooperative. Normal mood and affect.  Intake/Output from previous day: 07/22 0701 - 07/23 0700 In: 10.4 [I.V.:10.4] Out: 175 [Urine:175] Intake/Output this shift: No intake/output data recorded.  Lab Results: Recent Labs    02/06/21 1556 02/07/21 0327 02/08/21 0310  WBC 26.7* 24.8* 15.4*  HGB 9.7* 9.1* 9.4*  HCT 29.0* 27.4* 28.7*  PLT 32* 29* 32*   BMET Recent Labs    02/06/21 0156 02/07/21 0327 02/08/21 0310  NA 142 137 134*  K 3.5 3.5 4.6  CL 112* 109 108  CO2 21* 23 22  GLUCOSE 152* 121* 107*  BUN 26* 22 22  CREATININE 0.85 0.88 0.79  CALCIUM 8.4* 7.8* 8.2*   LFT Recent Labs    02/07/21 0327  PROT 8.6*  ALBUMIN 2.6*  AST 45*  ALT 44  ALKPHOS 44  BILITOT 0.9   PT/INR Recent Labs    02/05/21 1403 02/06/21 0156  LABPROT 16.7* 14.9  INR 1.4* 1.2    Studies/Results: No results found.     Assessment / Plan:    70 y/o female with history of ITP who presented with upper GI bleed /severe anemia in the setting of NSAID use and platelet count of 5 on admission. WBC also in the 30s, no pain or fevers. Treating ITP with steroids and IVIG. Her platelet count is up to 32 this AM but remains too low for elective endoscopy. BUN has trended down, no significant melena, her bleeding seems to have stopped with  PPI and improvement in platelet count. Endoscopy pending improvement in platelet count.   Given platelets remain low and she is not actively bleeding, will delay endoscopy until platelets more improved. In the interim should she have significant rebleeding we can do endoscopy with platelet transfusion. Unfortunately will postpone EGD and tentatively hold a spot for EGD tomorrow AM. Highest on ddx is PUD in the setting of NSAID use. Leukocytosis improving.   Recommend: - continue IV protonix - monitor Hgb / platelet count and for recurrent bleeding - endoscopy pending further improvement in platelet count, may be tomorrow or next few days, will be day by day decision. Hoping to avoid platelet transfusion if possible - full liquids today, NPO after MN  Jolly Mango, MD [facility]

## 2021-02-08 NOTE — Progress Notes (Signed)
PROGRESS NOTE    Jean Kramer  [id number] DOB: 08/25/1950 DOA: 02/05/2021 PCP: [name], MD    Chief Complaint  Patient presents with   Weakness    Weakness for 3 weeks.    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 She is hypotensive on presentation with severe anemia, Hgb of 5, with platelet count of 5. WBC noted to be in the 30s. BUN elevated  Subjective:  She is laying in bed watching TV, she is on 5liter oxygen, intermittent congested cough, reports her "normal cough from smoking" Reports not on oxygen at home Reports feeling more sob when lay flat , no edema, no chest pain No n/v, last bm several days ago, no ab pain   Assessment & Plan:   Active Problems:   Acute ITP (HCC)   GI bleed/hemorrhagic shock/acute blood loss anemia -Started on Levophed , PPI drip admitted to ICU -Received transfusion -Hemorrhagic shock has resolved, off pressors on 7/22 -To have EGD when platelet improves  ITP IVIG/steroid /Nplate per hema/onc Transfuse platelets if active bleeding or platelet less than 20  Sob/cough/oxygen requirement Will get cxr  COPD No wheezing Continue home meds   Hypothyroidism On Synthroid   .  Cigarette smoking Smoking cessation education provided, she declined a nicotine patch  Skin Assessment:  I have examined the patient's skin and I agree with the wound assessment as performed by the wound care RN as outlined below:  Pressure Injury 02/05/21 Sacrum Left;Medial Deep Tissue Pressure Injury - Purple or maroon localized area of discolored intact skin or blood-filled blister due to damage of underlying soft tissue from pressure and/or shear. (Active)  02/05/21 2000  Location: Sacrum  Location Orientation: Left;Medial  Staging: Deep Tissue Pressure Injury - Purple or maroon localized area of discolored intact skin or blood-filled  blister due to damage of underlying soft tissue from pressure and/or shear.  Wound Description (Comments):   Present on Admission: Yes    Unresulted Labs (From admission, onward)     Start     Ordered   02/08/21 1424  Vitamin B12  Add-on,   AD       Question:  Specimen collection method  Answer:  Lab=Lab collect   02/08/21 1423   02/08/21 1423  Immature Platelet Fraction  Add-on,   AD       Question:  Specimen collection method  Answer:  Lab=Lab collect   02/08/21 1423   02/08/21 1423  Ferritin  Add-on,   AD       Question:  Specimen collection method  Answer:  Lab=Lab collect   02/08/21 1423   02/08/21 1423  Iron and TIBC  Add-on,   AD       Question:  Specimen collection method  Answer:  Lab=Lab collect   02/08/21 1423   02/05/21 1339  Urinalysis, Routine w reflex microscopic  Once,   STAT        02/05/21 1339              DVT prophylaxis: SCDs Start: 02/05/21 1739   Code Status: Full Family Communication: Patient Disposition:   Status is: Inpatient   Dispo: The patient is from: Home              Anticipated d/c is to: To be determined              Anticipated d/c  date is: To be determined                Consultants:  Critical care GI Hematology oncology  Procedures:  Plan for EGD  Antimicrobials:    Anti-infectives (From admission, onward)    None          Objective: Vitals:   02/08/21 1200 02/08/21 1300 02/08/21 1400 02/08/21 1500  BP: 117/69 100/74 (!) 116/97 109/84  Pulse: 73 89 88 75  Resp: 11 (!) 26 (!) 21 16  Temp:      TempSrc:      SpO2: 98% 94% 96% 95%  Weight:        Intake/Output Summary (Last 24 hours) at 02/08/2021 1514 Last data filed at 02/08/2021 0530 Gross per 24 hour  Intake --  Output 375 ml  Net -375 ml   Filed Weights   02/07/21 0500 02/07/21 0600 02/08/21 0500  Weight: 84.8 kg 84.8 kg 84.8 kg    Examination:  General exam: calm, NAD Respiratory system: diminished. , no wheezing, no rales, no rhonchi,  Respiratory effort normal. Cardiovascular system: S1 & S2 heard, RRR. No JVD, no murmur, No pedal edema. Gastrointestinal system: Abdomen is nondistended, soft and nontender.  Normal bowel sounds heard. Central nervous system: Alert and oriented. No focal neurological deficits. Extremities: Symmetric 5 x 5 power. Skin: No rashes, lesions or ulcers Psychiatry: Judgement and insight appear normal. Mood & affect appropriate.     Data Reviewed: I have personally reviewed following labs and imaging studies  CBC: Recent Labs  Lab 02/05/21 1403 02/05/21 1821 02/06/21 0156 02/06/21 0912 02/06/21 1556 02/07/21 0327 02/08/21 0310  WBC 34.1*  --  28.2* 25.8* 26.7* 24.8* 15.4*  NEUTROABS 23.8*  --  21.9*  --   --   --   --   HGB 4.0*   < > 10.3* 9.3* 9.7* 9.1* 9.4*  HCT 12.5*   < > 30.0* 27.4* 29.0* 27.4* 28.7*  MCV 114.7*  --  96.5 98.2 99.7 100.7* 102.5*  PLT 5*  --  9* 29* 32* 29* 32*   < > = values in this interval not displayed.    Basic Metabolic Panel: Recent Labs  Lab 02/05/21 1350 02/05/21 1403 02/06/21 0156 02/07/21 0327 02/08/21 0310  NA 141 141 142 137 134*  K 3.9 3.8 3.5 3.5 4.6  CL 110 113* 112* 109 108  CO2  --  17* 21* 23 22  GLUCOSE 146* 152* 152* 121* 107*  BUN 41* 42* 26* 22 22  CREATININE 0.90 1.14* 0.85 0.88 0.79  CALCIUM  --  7.9* 8.4* 7.8* 8.2*    GFR: CrCl cannot be calculated (Unknown ideal weight.).  Liver Function Tests: Recent Labs  Lab 02/05/21 1403 02/06/21 0156 02/07/21 0327  AST 41 42* 45*  ALT 43 41 44  ALKPHOS 47 58 44  BILITOT 0.6 0.7 0.9  PROT 4.9* 5.8* 8.6*  ALBUMIN 2.3* 2.9* 2.6*    CBG: Recent Labs  Lab 02/05/21 1945  GLUCAP 110*     Recent Results (from the past 240 hour(s))  Resp Panel by RT-PCR (Flu A&B, Covid) Nasopharyngeal Swab     Status: None   Collection Time: 02/05/21  1:40 PM   Specimen: Nasopharyngeal Swab; Nasopharyngeal(NP) swabs in vial transport medium  Result Value Ref Range Status   SARS  Coronavirus 2 by RT PCR NEGATIVE NEGATIVE Final    Comment: (NOTE) SARS-CoV-2 target nucleic acids are NOT DETECTED.  The SARS-CoV-2 RNA is generally detectable in  upper respiratory specimens during the acute phase of infection. The lowest concentration of SARS-CoV-2 viral copies this assay can detect is 138 copies/mL. A negative result does not preclude SARS-Cov-2 infection and should not be used as the sole basis for treatment or other patient management decisions. A negative result may occur with  improper specimen collection/handling, submission of specimen other than nasopharyngeal swab, presence of viral mutation(s) within the areas targeted by this assay, and inadequate number of viral copies(<138 copies/mL). A negative result must be combined with clinical observations, patient history, and epidemiological information. The expected result is Negative.  Fact Sheet for Patients:  EntrepreneurPulse.com.au  Fact Sheet for Healthcare Providers:  IncredibleEmployment.be  This test is no t yet approved or cleared by the [place] FDA and  has been authorized for detection and/or diagnosis of SARS-CoV-2 by FDA under an Emergency Use Authorization (EUA). This EUA will remain  in effect (meaning this test can be used) for the duration of the COVID-19 declaration under Section 564(b)(1) of the Act, 21 U.S.C.section 360bbb-3(b)(1), unless the authorization is terminated  or revoked sooner.       Influenza A by PCR NEGATIVE NEGATIVE Final   Influenza B by PCR NEGATIVE NEGATIVE Final    Comment: (NOTE) The Xpert Xpress SARS-CoV-2/FLU/RSV plus assay is intended as an aid in the diagnosis of influenza from Nasopharyngeal swab specimens and should not be used as a sole basis for treatment. Nasal washings and aspirates are unacceptable for Xpert Xpress SARS-CoV-2/FLU/RSV testing.  Fact Sheet for  Patients: EntrepreneurPulse.com.au  Fact Sheet for Healthcare Providers: IncredibleEmployment.be  This test is not yet approved or cleared by the [place] FDA and has been authorized for detection and/or diagnosis of SARS-CoV-2 by FDA under an Emergency Use Authorization (EUA). This EUA will remain in effect (meaning this test can be used) for the duration of the COVID-19 declaration under Section 564(b)(1) of the Act, 21 U.S.C. section 360bbb-3(b)(1), unless the authorization is terminated or revoked.  Performed at [facility], [place] [address] [place], [place] [address]   MRSA Next Gen by PCR, Nasal     Status: None   Collection Time: 02/06/21  4:14 AM   Specimen: Nasal Mucosa; Nasal Swab  Result Value Ref Range Status   MRSA by PCR Next Gen NOT DETECTED NOT DETECTED Final    Comment: (NOTE) The GeneXpert MRSA Assay (FDA approved for NASAL specimens only), is one component of a comprehensive MRSA colonization surveillance program. It is not intended to diagnose MRSA infection nor to guide or monitor treatment for MRSA infections. Test performance is not FDA approved in patients less than 45 years old. Performed at [facility], [place] [address] [place], [place] [address]          Radiology Studies: No results found.      Scheduled Meds:  sodium chloride   Intravenous Once   sodium chloride   Intravenous Once   chlorhexidine  15 mL Mouth Rinse BID   Chlorhexidine Gluconate Cloth  6 each Topical Daily   escitalopram  20 mg Oral Daily   levothyroxine  25 mcg Oral q morning   lidocaine  1 patch Transdermal Q24H   mouth rinse  15 mL Mouth Rinse q12n4p   mometasone-formoterol  2 puff Inhalation BID   pantoprazole (PROTONIX) IV  40 mg Intravenous Q12H   predniSONE  60 mg Oral Q breakfast   QUEtiapine  100 mg Oral QHS   romiPLOStim  2 mcg/kg Subcutaneous  Weekly   rOPINIRole  0.5 mg Oral TID   Continuous  Infusions:  sodium chloride     sodium chloride     sodium chloride     sodium chloride     sodium chloride       LOS: 3 days   Time spent: 35mns Greater than 50% of this time was spent in counseling, explanation of diagnosis, planning of further management, and coordination of care.   Voice Recognition /Viviann Sparedictation system was used to create this note, attempts have been made to correct errors. Please contact the author with questions and/or clarifications.   FFlorencia Reasons MD PhD FACP [facility]  Available via Epic secure chat 7am-7pm for nonurgent issues Please page for urgent issues To page the attending provider between 7A-7P or the covering provider during after hours 7P-7A, please log into the web site [email or web address] and access using universal [place] password for that web site. If you do not have the password, please call the hospital operator.    02/08/2021, 3:14 PM

## 2021-02-08 NOTE — Progress Notes (Signed)
HEMATOLOGY-ONCOLOGY PROGRESS NOTE  DOS 02/08/2021  SUBJECTIVE:   Patient notes no BM's in last 24h no overt evidence of further GI bleeding. Mild nose bleed. Labs from this AM discussed PLT @ 32k. Patient notes she does not like how steroids make her feel. 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 Following with Dr Hinton Rao since 2019 and has been on steroids on and off for relapses of her ITP. She notes she has been using 4 BC powders a day for months for headache and bodyaches. She understand she has been recommended not to do that.  REVIEW OF SYSTEMS:  . Marland Kitchen10 Point review of Systems was done is negative except as noted above.   I have reviewed the past medical history, past surgical history, social history and family history with the patient and they are unchanged from previous note.   PHYSICAL EXAMINATION:  Vitals:   02/08/21 0845 02/08/21 1100  BP:    Pulse: 85   Resp: 16   Temp:  97.9 F (36.6 C)  SpO2: 95%    Filed Weights   02/07/21 0500 02/07/21 0600 02/08/21 0500  Weight: 186 lb 15.2 oz (84.8 kg) 186 lb 15.2 oz (84.8 kg) 186 lb 15.2 oz (84.8 kg)    Intake/Output from previous day: 07/22 0701 - 07/23 0700 In: 10.4 [I.V.:10.4] Out: 375 [Urine:375] . GENERAL:alert, in no acute distress and comfortable SKIN: no acute rashes, no significant lesions EYES: conjunctiva are pink and non-injected, sclera anicteric OROPHARYNX: MMM, no exudates, no oropharyngeal erythema or ulceration NECK: supple, no JVD LYMPH:  no palpable lymphadenopathy in the cervical, axillary or inguinal regions LUNGS: clear to auscultation b/l with normal respiratory effort HEART: regular rate & rhythm ABDOMEN:  normoactive bowel sounds , non tender, not distended. Extremity: no pedal edema PSYCH: alert & oriented x 3 with fluent speech NEURO: no focal motor/sensory deficits   LABORATORY DATA:  I have reviewed the  data as listed CMP Latest Ref Rng & Units 02/08/2021 02/07/2021 02/06/2021  Glucose 70 - 99 mg/dL 107(H) 121(H) 152(H)  BUN 8 - 23 mg/dL 22 22 26(H)  Creatinine 0.44 - 1.00 mg/dL 0.79 0.88 0.85  Sodium 135 - 145 mmol/L 134(L) 137 142  Potassium 3.5 - 5.1 mmol/L 4.6 3.5 3.5  Chloride 98 - 111 mmol/L 108 109 112(H)  CO2 22 - 32 mmol/L 22 23 21(L)  Calcium 8.9 - 10.3 mg/dL 8.2(L) 7.8(L) 8.4(L)  Total Protein 6.5 - 8.1 g/dL - 8.6(H) 5.8(L)  Total Bilirubin 0.3 - 1.2 mg/dL - 0.9 0.7  Alkaline Phos 38 - 126 U/L - 44 58  AST 15 - 41 U/L - 45(H) 42(H)  ALT 0 - 44 U/L - 44 41   . CBC Latest Ref Rng & Units 02/08/2021 02/07/2021 02/06/2021  WBC 4.0 - 10.5 K/uL 15.4(H) 24.8(H) 26.7(H)  Hemoglobin 12.0 - 15.0 g/dL 9.4(L) 9.1(L) 9.7(L)  Hematocrit 36.0 - 46.0 % 28.7(L) 27.4(L) 29.0(L)  Platelets 150 - 400 K/uL 32(L) 29(LL) 32(L)    CT Head Wo Contrast  Result Date: 02/05/2021 CLINICAL DATA:  Headache, suspected intracranial hemorrhage EXAM: CT HEAD WITHOUT CONTRAST TECHNIQUE: Contiguous axial images were obtained from the base of the skull through the vertex without intravenous contrast. COMPARISON:  03/31/2006 FINDINGS: Brain: No evidence of acute infarction, hemorrhage, hydrocephalus, extra-axial collection or mass lesion/mass effect. Mild atrophy. Vascular: Atherosclerotic and physiologic intracranial calcifications. Skull: Normal. Negative for fracture or focal lesion. Sinuses/Orbits:  Dense opacification of the left maxillary sinus and partial opacification of left ethmoid air cells. Orbits unremarkable. Other: 2cm parietal scalp lesion near the midline and1 cm right parietooccipital scalp lesion, possibly sebaceous cyst but nonspecific. IMPRESSION: 1. No acute intracranial process. 2. Left maxillary and ethmoid sinus disease Electronically Signed   By: Lucrezia Europe M.D.   On: 02/05/2021 14:51    ASSESSMENT AND PLAN:  70 year old woman with:  1.  Thrombocytopenia - Acute ITP causing severe GI  bleeding. Patient with long h/o ITP s/p splenectomy in her teens. Exacerbated by using large amounts of BC powder (NSAIDS) despite recommendations to the contrary. GI Bleeding could be from NSAID induced gastropathy.  She has received 2 units of platelets this admission. Last given on 02/05/21.  Remains on dexamethasone 10 mg IV daily.  Status post 2 doses of IVIG 1 g/kg given on 7/21 and 7/22.  Platelet count today is 29,0032k and she denies any active bleeding.     2.  Leukocytosis: This appears to be reactive at this time without any evidence of a hematological disorder.   3.  Anemia: Related to GI bleeding and supportive transfusion per the primary team to keep her hemodynamics stable.  GI planning for endoscopy this weekend pending improving platelet count. PLAN -continue Steroids -PPI for GI prophylaxis/suspected NSAID gastropathy -no indication for PLT transfusion today. Transfuse if actively bleeding or PLT<20k -starting on Nplate 4mg/kg weekly from today to target PLT of 50k given GI bleeding -absolutely no NSAIDS -F/u with Dr MHinton Raoon discharge. -consideration outpatient CT abd to evaluate for regenerative splenules/accessory spleen if platelets still low despite being off NSAIDS. - I shall f/u tomorrow and Dr SAlen Blewon Monday. - appreciate excellent hospital medicine cares.  GSullivan LoneMD MS  TT 35 mins >50% on direct patient contact, counseling and co-ordination of cares.

## 2021-02-09 ENCOUNTER — Encounter (HOSPITAL_COMMUNITY)
Admission: EM | Disposition: A | Discharge status: Home Health | Payer: Self-pay | Source: Home / Self Care | Attending: Internal Medicine

## 2021-02-09 ENCOUNTER — Inpatient Hospital Stay (HOSPITAL_COMMUNITY): Payer: Medicare Other

## 2021-02-09 DIAGNOSIS — D62 Acute posthemorrhagic anemia: Secondary | ICD-10-CM | POA: Diagnosis not present

## 2021-02-09 DIAGNOSIS — D693 Immune thrombocytopenic purpura: Secondary | ICD-10-CM | POA: Diagnosis not present

## 2021-02-09 LAB — CBC
HCT: 27.9 % — ABNORMAL LOW (ref 36.0–46.0)
Hemoglobin: 9 g/dL — ABNORMAL LOW (ref 12.0–15.0)
MCH: 33.2 pg (ref 26.0–34.0)
MCHC: 32.3 g/dL (ref 30.0–36.0)
MCV: 103 fL — ABNORMAL HIGH (ref 80.0–100.0)
Platelets: 19 10*3/uL — CL (ref 150–400)
RBC: 2.71 MIL/uL — ABNORMAL LOW (ref 3.87–5.11)
RDW: 20.6 % — ABNORMAL HIGH (ref 11.5–15.5)
WBC: 12.7 10*3/uL — ABNORMAL HIGH (ref 4.0–10.5)
nRBC: 2.9 % — ABNORMAL HIGH (ref 0.0–0.2)

## 2021-02-09 SURGERY — CANCELLED PROCEDURE

## 2021-02-09 MED ORDER — FUROSEMIDE 10 MG/ML IJ SOLN
40.0000 mg | Freq: Once | INTRAMUSCULAR | Status: AC
  Administered 2021-02-09: 40 mg via INTRAVENOUS
  Filled 2021-02-09: qty 4

## 2021-02-09 MED ORDER — IPRATROPIUM-ALBUTEROL 0.5-2.5 (3) MG/3ML IN SOLN
3.0000 mL | Freq: Four times a day (QID) | RESPIRATORY_TRACT | Status: DC
  Administered 2021-02-09 – 2021-02-10 (×5): 3 mL via RESPIRATORY_TRACT
  Filled 2021-02-09 (×5): qty 3

## 2021-02-09 MED ORDER — IPRATROPIUM-ALBUTEROL 0.5-2.5 (3) MG/3ML IN SOLN: 3.0000 mL | Freq: Four times a day (QID) | RESPIRATORY_TRACT | Status: DC

## 2021-02-09 MED ORDER — ATORVASTATIN CALCIUM 10 MG PO TABS
20.0000 mg | ORAL_TABLET | Freq: Every day | ORAL | Status: DC
  Administered 2021-02-09 – 2021-02-20 (×12): 20 mg via ORAL
  Filled 2021-02-09 (×12): qty 2

## 2021-02-09 MED ORDER — FLUTICASONE PROPIONATE 50 MCG/ACT NA SUSP
2.0000 | Freq: Every day | NASAL | Status: DC
  Administered 2021-02-09 – 2021-02-20 (×12): 2 via NASAL
  Filled 2021-02-09: qty 16

## 2021-02-09 MED ORDER — SODIUM CHLORIDE 0.9% IV SOLUTION: Freq: Once | INTRAVENOUS | Status: AC

## 2021-02-09 MED ORDER — GUAIFENESIN ER 600 MG PO TB12
1200.0000 mg | ORAL_TABLET | Freq: Two times a day (BID) | ORAL | Status: DC
  Administered 2021-02-09 – 2021-02-20 (×23): 1200 mg via ORAL
  Filled 2021-02-09 (×23): qty 2

## 2021-02-09 SURGICAL SUPPLY — 15 items

## 2021-02-09 NOTE — Progress Notes (Signed)
      Progress Note   Subjective  Platelets down to 19 this AM. She has not had any further bleeding or melena. On PPI. Wants something to drink / eat.   Objective   Vital signs in last 24 hours: Temp:  [97.9 F (36.6 C)-98.6 F (37 C)] 98 F (36.7 C) (07/24 0852) Pulse Rate:  [62-89] 69 (07/24 0852) Resp:  [8-26] 14 (07/24 0852) BP: (90-129)/(58-97) 125/71 (07/24 0852) SpO2:  [94 %-100 %] 99 % (07/24 0852) Weight:  [84.5 kg] 84.5 kg (07/24 0423) Last BM Date: 02/06/21 General:    white female in NAD Abdomen:  Soft, nontender and nondistended.  Extremities:  Without edema. Neurologic:  Alert and oriented,  grossly normal neurologically. Psych:  Cooperative. Normal mood and affect.  Intake/Output from previous day: 07/23 0701 - 07/24 0700 In: 321.8 [I.V.:20.3; Blood:301.5] Out: 400 [Urine:400] Intake/Output this shift: No intake/output data recorded.  Lab Results: Recent Labs    02/07/21 0327 02/08/21 0310 02/09/21 0054  WBC 24.8* 15.4* 12.7*  HGB 9.1* 9.4* 9.0*  HCT 27.4* 28.7* 27.9*  PLT 29* 32* 19*   BMET Recent Labs    02/07/21 0327 02/08/21 0310  NA 137 134*  K 3.5 4.6  CL 109 108  CO2 23 22  GLUCOSE 121* 107*  BUN 22 22  CREATININE 0.88 0.79  CALCIUM 7.8* 8.2*   LFT Recent Labs    02/07/21 0327  PROT 8.6*  ALBUMIN 2.6*  AST 45*  ALT 44  ALKPHOS 44  BILITOT 0.9   PT/INR No results for input(s): LABPROT, INR in the last 72 hours.  Studies/Results: DG CHEST PORT 1 VIEW  Result Date: 02/08/2021 CLINICAL DATA:  Cough, weakness. EXAM: PORTABLE CHEST 1 VIEW COMPARISON:  Chest radiograph dated 09/20/2007. FINDINGS: The heart size is normal. Vascular calcifications are seen in the aortic arch. There are mild bilateral lower lung predominant interstitial and airspace opacities. A small right pleural effusion may contribute. There is no left pleural effusion. There is no pneumothorax on either side. No acute osseous injury. IMPRESSION: Mild  bilateral lower lung predominant interstitial and airspace opacities may represent pulmonary edema and/or pneumonia. A small right pleural effusion may contribute. Electronically Signed   By: Zerita Boers M.D.   On: 02/08/2021 17:34       Assessment / Plan:    70 y/o female with history of ITP who presented with upper GI bleed /severe anemia in the setting of NSAID use and platelet count of 5 on admission. WBC initially was also in the 30s, no pain or fevers. Treated ITP with steroids and IVIG. Endoscopy has been on hold due to persistent thrombocytopenia. Platelets down to 19 today, not ready for endoscopy. Ideally would like around 50 for an endoscopy. Will continue to hold on elective endoscopy this while her platelets take some time to improve, assuming she has no recurrent bleeding. Suspect she has PUD in the setting of significant NSAID use. Her leukocytosis is otherwise improving.    Recommend: - continue IV protonix - monitor Hgb / platelet count and for recurrent bleeding - endoscopy pending further improvement in platelet count, may not be for a few days yet. Will follow peripherally until platelets improve, but if she has recurrent bleeding in the interim please let us know. - resume full liquid diet, can do soft diet if she remains stable  Dr. Lyndel Safe to assume GI service this week. Call with questions.  Jolly Mango, MD [facility]

## 2021-02-09 NOTE — Anesthesia Preprocedure Evaluation (Deleted)
Anesthesia Evaluation    Reviewed: Allergy & Precautions, Patient's Chart, lab work & pertinent test results  Airway        Dental   Pulmonary COPD,  COPD inhaler,           Cardiovascular negative cardio ROS    ECG: ST   Neuro/Psych PSYCHIATRIC DISORDERS CVA    GI/Hepatic negative GI ROS, Neg liver ROS,   Endo/Other  Hypothyroidism   Renal/GU negative Renal ROS     Musculoskeletal negative musculoskeletal ROS (+)   Abdominal   Peds  Hematology  (+) Blood dyscrasia, anemia , HLD Thrombocytopenia    Anesthesia Other Findings melena, upper GI bleed  Reproductive/Obstetrics                             Anesthesia Physical Anesthesia Plan  ASA: 4  Anesthesia Plan:    Post-op Pain Management:    Induction:   PONV Risk Score and Plan:   Airway Management Planned: Simple Face Mask  Additional Equipment:   Intra-op Plan:   Post-operative Plan:   Informed Consent:   Plan Discussed with:   Anesthesia Plan Comments:         Anesthesia Quick Evaluation

## 2021-02-09 NOTE — Progress Notes (Signed)
Hematology Short Note  . CBC Latest Ref Rng & Units 02/09/2021 02/08/2021 02/07/2021  WBC 4.0 - 10.5 K/uL 12.7(H) 15.4(H) 24.8(H)  Hemoglobin 12.0 - 15.0 g/dL 9.0(L) 9.4(L) 9.1(L)  Hematocrit 36.0 - 46.0 % 27.9(L) 28.7(L) 27.4(L)  Platelets 150 - 400 K/uL 19(LL) 32(L) 29(LL)   PLT down to 19k- ITP + consumption + NSAIDS Transfuse prn for active bleeding or if PLT<10k Has received IVIG, on steroids, received 1st dose of Nplate @ 51mg/kg yesterday. Will monitor for response to Nplate-- if no improvement/stability in additional 24-48h -- will consider dose escalation. Appreciate hospital medicine and GI input  GSullivan Lone

## 2021-02-09 NOTE — Progress Notes (Signed)
   02/09/21 0300  Provider Notification  Provider Name/Title Dr Marlowe Sax  Date Provider Notified 02/09/21  Time Provider Notified 0300  Notification Type Page  Notification Reason Critical result  Test performed and critical result CBC plt 19  Date Critical Result Received 02/09/21  Time Critical Result Received 0257  Provider response See new orders  Date of Provider Response 02/09/21  Time of Provider Response [id number]

## 2021-02-09 NOTE — Progress Notes (Signed)
PROGRESS NOTE    Jean Kramer  [id number] DOB: Jul 27, 1950 DOA: 02/05/2021 PCP: [name], MD    Chief Complaint  Patient presents with   Weakness    Weakness for 3 weeks.    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 She is hypotensive on presentation with severe anemia, Hgb of 5, with platelet count of 5. WBC noted to be in the 30s. BUN elevated  Subjective:  She received one pack of plt transfusion this am She has a congested cough, not able to cough up, she is on 5liter oxygen Denies pain,  No n/v, having bm, report stool is brown   Assessment & Plan:   Active Problems:   Acute ITP (HCC)   GI bleed/hemorrhagic shock/acute blood loss anemia -she was Started on Levophed , PPI drip and admitted to ICU initially  -Received transfusion -Hemorrhagic shock has resolved, off pressors on 7/22, transferred to hospitalist service on 7/23 -To have EGD when platelet improves, gi following   ITP IVIG/steroid /Nplate per hemo/onc Transfuse platelets if active bleeding or platelet less than 10 Appreciate hemo/onc input   Acute on chronic diastolic chf exacerbation/volume overload/pulmonary edema -she was noticed to be on 5liter oxygen when I first evaluated patient on 7/23, cxr ordered showed "Mild bilateral lower lung predominant interstitial and airspace opacities may represent pulmonary edema and/or pneumonia. A small right pleural effusion may contribute" -she reports not able to lay flat due to sob, cough, denies chest pain -iv lasix x1 on 7/23 with good urine output, will give another dose on 7/24, close monitor I/o's /volume status -repeat cxt to reassess   COPD/current smoker Appear having exacerbation on 7/24 Having congested cough, Noticed wheezing on 7/24, start nebs/mucinex, she is already on prednisone  Check procalcitonin, sputum culture Hold off on abx,  monitor     Hypothyroidism On Synthroid   Cigarette smoking Smoking cessation education provided, she declined a nicotine patch     Skin Assessment:  I have examined the patient's skin and I agree with the wound assessment as performed by the wound care RN as outlined below:  Pressure Injury 02/05/21 Sacrum Left;Medial Deep Tissue Pressure Injury - Purple or maroon localized area of discolored intact skin or blood-filled blister due to damage of underlying soft tissue from pressure and/or shear. (Active)  02/05/21 2000  Location: Sacrum  Location Orientation: Left;Medial  Staging: Deep Tissue Pressure Injury - Purple or maroon localized area of discolored intact skin or blood-filled blister due to damage of underlying soft tissue from pressure and/or shear.  Wound Description (Comments):   Present on Admission: Yes    Unresulted Labs (From admission, onward)     Start     Ordered   02/10/21 [id number]  Basic metabolic panel  Tomorrow morning,   R       Question:  Specimen collection method  Answer:  Lab=Lab collect   02/09/21 0808   02/10/21 0500  Magnesium  Tomorrow morning,   R       Question:  Specimen collection method  Answer:  Lab=Lab collect   02/09/21 0808   02/09/21 0500  CBC  Daily,   R     Question:  Specimen collection method  Answer:  Lab=Lab collect   02/08/21 2214   02/05/21 1339  Urinalysis, Routine w reflex microscopic  Once,   STAT  02/05/21 1339              DVT prophylaxis: SCDs Start: 02/05/21 1739   Code Status: Full Family Communication: Patient Disposition:   Status is: Inpatient   Dispo: The patient is from: Home              Anticipated d/c is to: To be determined              Anticipated d/c date is: To be determined                Consultants:  Critical care GI Hematology oncology  Procedures:  Plan for EGD once plt improves  Antimicrobials:    Anti-infectives (From admission, onward)    None           Objective: Vitals:   02/09/21 0420 02/09/21 0423 02/09/21 0616 02/09/21 0806  BP: 129/73  113/69   Pulse: 75  76   Resp: 18  20   Temp: 98.2 F (36.8 C)  98.6 F (37 C)   TempSrc: Oral  Oral   SpO2:   100% 97%  Weight:  84.5 kg      Intake/Output Summary (Last 24 hours) at 02/09/2021 0808 Last data filed at 02/09/2021 [id number] Gross per 24 hour  Intake 321.83 ml  Output 400 ml  Net -78.17 ml   Filed Weights   02/07/21 0600 02/08/21 0500 02/09/21 0423  Weight: 84.8 kg 84.8 kg 84.5 kg    Examination:  General exam: calm, NAD Respiratory system: bilateral diffuse wheezing,  Respiratory effort normal. Cardiovascular system: S1 & S2 heard, RRR. No pedal edema. Gastrointestinal system: Abdomen is nondistended, soft and nontender.  Normal bowel sounds heard. Central nervous system: Alert and oriented. No focal neurological deficits. Extremities: Symmetric 5 x 5 power. Skin: No rashes, lesions or ulcers Psychiatry: Judgement and insight appear normal. Mood & affect appropriate.     Data Reviewed: I have personally reviewed following labs and imaging studies  CBC: Recent Labs  Lab 02/05/21 1403 02/05/21 1821 02/06/21 0156 02/06/21 0912 02/06/21 1556 02/07/21 0327 02/08/21 0310 02/09/21 0054  WBC 34.1*  --  28.2* 25.8* 26.7* 24.8* 15.4* 12.7*  NEUTROABS 23.8*  --  21.9*  --   --   --   --   --   HGB 4.0*   < > 10.3* 9.3* 9.7* 9.1* 9.4* 9.0*  HCT 12.5*   < > 30.0* 27.4* 29.0* 27.4* 28.7* 27.9*  MCV 114.7*  --  96.5 98.2 99.7 100.7* 102.5* 103.0*  PLT 5*  --  9* 29* 32* 29* 32* 19*   < > = values in this interval not displayed.    Basic Metabolic Panel: Recent Labs  Lab 02/05/21 1350 02/05/21 1403 02/06/21 0156 02/07/21 0327 02/08/21 0310  NA 141 141 142 137 134*  K 3.9 3.8 3.5 3.5 4.6  CL 110 113* 112* 109 108  CO2  --  17* 21* 23 22  GLUCOSE 146* 152* 152* 121* 107*  BUN 41* 42* 26* 22 22  CREATININE 0.90 1.14* 0.85 0.88 0.79  CALCIUM  --  7.9*  8.4* 7.8* 8.2*    GFR: CrCl cannot be calculated (Unknown ideal weight.).  Liver Function Tests: Recent Labs  Lab 02/05/21 1403 02/06/21 0156 02/07/21 0327  AST 41 42* 45*  ALT 43 41 44  ALKPHOS 47 58 44  BILITOT 0.6 0.7 0.9  PROT 4.9* 5.8* 8.6*  ALBUMIN 2.3* 2.9* 2.6*    CBG: Recent Labs  Lab  02/05/21 1945  GLUCAP 110*     Recent Results (from the past 240 hour(s))  Resp Panel by RT-PCR (Flu A&B, Covid) Nasopharyngeal Swab     Status: None   Collection Time: 02/05/21  1:40 PM   Specimen: Nasopharyngeal Swab; Nasopharyngeal(NP) swabs in vial transport medium  Result Value Ref Range Status   SARS Coronavirus 2 by RT PCR NEGATIVE NEGATIVE Final    Comment: (NOTE) SARS-CoV-2 target nucleic acids are NOT DETECTED.  The SARS-CoV-2 RNA is generally detectable in upper respiratory specimens during the acute phase of infection. The lowest concentration of SARS-CoV-2 viral copies this assay can detect is 138 copies/mL. A negative result does not preclude SARS-Cov-2 infection and should not be used as the sole basis for treatment or other patient management decisions. A negative result may occur with  improper specimen collection/handling, submission of specimen other than nasopharyngeal swab, presence of viral mutation(s) within the areas targeted by this assay, and inadequate number of viral copies(<138 copies/mL). A negative result must be combined with clinical observations, patient history, and epidemiological information. The expected result is Negative.  Fact Sheet for Patients:  EntrepreneurPulse.com.au  Fact Sheet for Healthcare Providers:  IncredibleEmployment.be  This test is no t yet approved or cleared by the [place] FDA and  has been authorized for detection and/or diagnosis of SARS-CoV-2 by FDA under an Emergency Use Authorization (EUA). This EUA will remain  in effect (meaning this test can be used) for the  duration of the COVID-19 declaration under Section 564(b)(1) of the Act, 21 U.S.C.section 360bbb-3(b)(1), unless the authorization is terminated  or revoked sooner.       Influenza A by PCR NEGATIVE NEGATIVE Final   Influenza B by PCR NEGATIVE NEGATIVE Final    Comment: (NOTE) The Xpert Xpress SARS-CoV-2/FLU/RSV plus assay is intended as an aid in the diagnosis of influenza from Nasopharyngeal swab specimens and should not be used as a sole basis for treatment. Nasal washings and aspirates are unacceptable for Xpert Xpress SARS-CoV-2/FLU/RSV testing.  Fact Sheet for Patients: EntrepreneurPulse.com.au  Fact Sheet for Healthcare Providers: IncredibleEmployment.be  This test is not yet approved or cleared by the [place] FDA and has been authorized for detection and/or diagnosis of SARS-CoV-2 by FDA under an Emergency Use Authorization (EUA). This EUA will remain in effect (meaning this test can be used) for the duration of the COVID-19 declaration under Section 564(b)(1) of the Act, 21 U.S.C. section 360bbb-3(b)(1), unless the authorization is terminated or revoked.  Performed at [facility], [place] [address] [place], [place] [address]   MRSA Next Gen by PCR, Nasal     Status: None   Collection Time: 02/06/21  4:14 AM   Specimen: Nasal Mucosa; Nasal Swab  Result Value Ref Range Status   MRSA by PCR Next Gen NOT DETECTED NOT DETECTED Final    Comment: (NOTE) The GeneXpert MRSA Assay (FDA approved for NASAL specimens only), is one component of a comprehensive MRSA colonization surveillance program. It is not intended to diagnose MRSA infection nor to guide or monitor treatment for MRSA infections. Test performance is not FDA approved in patients less than 33 years old. Performed at [facility], [place] [address] [place], [place] [address]          Radiology Studies: DG CHEST PORT 1 VIEW  Result Date:  02/08/2021 CLINICAL DATA:  Cough, weakness. EXAM: PORTABLE CHEST 1 VIEW COMPARISON:  Chest radiograph dated 09/20/2007. FINDINGS: The heart size is normal. Vascular  calcifications are seen in the aortic arch. There are mild bilateral lower lung predominant interstitial and airspace opacities. A small right pleural effusion may contribute. There is no left pleural effusion. There is no pneumothorax on either side. No acute osseous injury. IMPRESSION: Mild bilateral lower lung predominant interstitial and airspace opacities may represent pulmonary edema and/or pneumonia. A small right pleural effusion may contribute. Electronically Signed   By: Zerita Boers M.D.   On: 02/08/2021 17:34        Scheduled Meds:  sodium chloride   Intravenous Once   sodium chloride   Intravenous Once   chlorhexidine  15 mL Mouth Rinse BID   Chlorhexidine Gluconate Cloth  6 each Topical Daily   escitalopram  20 mg Oral Daily   furosemide  40 mg Intravenous Once   levothyroxine  25 mcg Oral q morning   lidocaine  1 patch Transdermal Q24H   mouth rinse  15 mL Mouth Rinse q12n4p   mometasone-formoterol  2 puff Inhalation BID   pantoprazole (PROTONIX) IV  40 mg Intravenous Q12H   predniSONE  60 mg Oral Q breakfast   QUEtiapine  100 mg Oral QHS   romiPLOStim  2 mcg/kg Subcutaneous Weekly   rOPINIRole  0.5 mg Oral TID   Continuous Infusions:  sodium chloride     sodium chloride     sodium chloride     sodium chloride     sodium chloride       LOS: 4 days   Time spent: 19mns Greater than 50% of this time was spent in counseling, explanation of diagnosis, planning of further management, and coordination of care.   Voice Recognition /Viviann Sparedictation system was used to create this note, attempts have been made to correct errors. Please contact the author with questions and/or clarifications.   FFlorencia Reasons MD PhD FACP [facility]  Available via Epic secure chat 7am-7pm for nonurgent issues Please page  for urgent issues To page the attending provider between 7A-7P or the covering provider during after hours 7P-7A, please log into the web site [email or web address] and access using universal Joanna password for that web site. If you do not have the password, please call the hospital operator.    02/09/2021, 8:08 AM

## 2021-02-10 DIAGNOSIS — D649 Anemia, unspecified: Secondary | ICD-10-CM

## 2021-02-10 DIAGNOSIS — D72819 Decreased white blood cell count, unspecified: Secondary | ICD-10-CM | POA: Diagnosis not present

## 2021-02-10 DIAGNOSIS — K922 Gastrointestinal hemorrhage, unspecified: Secondary | ICD-10-CM

## 2021-02-10 DIAGNOSIS — Z79899 Other long term (current) drug therapy: Secondary | ICD-10-CM

## 2021-02-10 DIAGNOSIS — R571 Hypovolemic shock: Secondary | ICD-10-CM | POA: Diagnosis not present

## 2021-02-10 DIAGNOSIS — D693 Immune thrombocytopenic purpura: Secondary | ICD-10-CM | POA: Diagnosis not present

## 2021-02-10 DIAGNOSIS — D62 Acute posthemorrhagic anemia: Secondary | ICD-10-CM | POA: Diagnosis not present

## 2021-02-10 LAB — CBC
HCT: 29.7 % — ABNORMAL LOW (ref 36.0–46.0)
Hemoglobin: 10 g/dL — ABNORMAL LOW (ref 12.0–15.0)
MCH: 33.3 pg (ref 26.0–34.0)
MCHC: 33.7 g/dL (ref 30.0–36.0)
MCV: 99 fL (ref 80.0–100.0)
Platelets: 12 10*3/uL — CL (ref 150–400)
RBC: 3 MIL/uL — ABNORMAL LOW (ref 3.87–5.11)
RDW: 19.1 % — ABNORMAL HIGH (ref 11.5–15.5)
WBC: 11.9 10*3/uL — ABNORMAL HIGH (ref 4.0–10.5)
nRBC: 0.8 % — ABNORMAL HIGH (ref 0.0–0.2)

## 2021-02-10 LAB — BASIC METABOLIC PANEL
Anion gap: 7 (ref 5–15)
BUN: 22 mg/dL (ref 8–23)
CO2: 27 mmol/L (ref 22–32)
Calcium: 8.3 mg/dL — ABNORMAL LOW (ref 8.9–10.3)
Chloride: 99 mmol/L (ref 98–111)
Creatinine, Ser: 0.77 mg/dL (ref 0.44–1.00)
GFR, Estimated: >60 mL/min (ref >60)
Glucose, Bld: 72 mg/dL (ref 70–99)
Potassium: 3.8 mmol/L (ref 3.5–5.1)
Sodium: 133 mmol/L — ABNORMAL LOW (ref 135–145)

## 2021-02-10 LAB — BPAM PLATELET PHERESIS
Blood Product Expiration Date: 202207242359
ISSUE DATE / TIME: 202207240352
Unit Type and Rh: 5100

## 2021-02-10 LAB — PREPARE PLATELET PHERESIS: Unit division: 0

## 2021-02-10 LAB — PROCALCITONIN: Procalcitonin: <0.1 ng/mL

## 2021-02-10 LAB — MAGNESIUM: Magnesium: 2.1 mg/dL (ref 1.7–2.4)

## 2021-02-10 MED ORDER — FUROSEMIDE 40 MG PO TABS
40.0000 mg | ORAL_TABLET | Freq: Every day | ORAL | Status: DC
  Administered 2021-02-10 – 2021-02-16 (×5): 40 mg via ORAL
  Filled 2021-02-10 (×7): qty 1

## 2021-02-10 MED ORDER — PANTOPRAZOLE SODIUM 40 MG PO TBEC
40.0000 mg | DELAYED_RELEASE_TABLET | Freq: Two times a day (BID) | ORAL | Status: DC
  Administered 2021-02-10 – 2021-02-20 (×20): 40 mg via ORAL
  Filled 2021-02-10 (×20): qty 1

## 2021-02-10 NOTE — Consult Note (Addendum)
Daily Rounding Note  02/10/2021, 11:35 AM  LOS: 5 days   SUBJECTIVE:   Chief complaint:    GI bleed in setting of severe thrombocytopenia.  No melena, hematochezia, hemoptysis, is oozing from phlebotomy site.  OBJECTIVE:         Vital signs in last 24 hours:    Temp:  [97.6 F (36.4 C)-98.3 F (36.8 C)] 97.9 F (36.6 C) (07/25 1118) Pulse Rate:  [62-80] 62 (07/25 1118) Resp:  [16-20] 18 (07/25 0446) BP: (108-136)/(66-96) 125/96 (07/25 1118) SpO2:  [92 %-100 %] 98 % (07/25 1118) Weight:  [80.8 kg] 80.8 kg (07/25 0446) Last BM Date: 02/06/21 Filed Weights   02/08/21 0500 02/09/21 0423 02/10/21 0446  Weight: 84.8 kg 84.5 kg 80.8 kg   Not re-examined.    Intake/Output from previous day: 07/24 0701 - 07/25 0700 In: 480 [P.O.:480] Out: 3400 [Urine:3400]  Intake/Output this shift: Total I/O In: -  Out: 650 [Urine:650]  Lab Results: Recent Labs    02/08/21 0310 02/09/21 0054 02/10/21 0657  WBC 15.4* 12.7* 11.9*  HGB 9.4* 9.0* 10.0*  HCT 28.7* 27.9* 29.7*  PLT 32* 19* 12*   BMET Recent Labs    02/08/21 0310 02/10/21 0657  NA 134* 133*  K 4.6 3.8  CL 108 99  CO2 22 27  GLUCOSE 107* 72  BUN 22 22  CREATININE 0.79 0.77  CALCIUM 8.2* 8.3*   LFT No results for input(s): PROT, ALBUMIN, AST, ALT, ALKPHOS, BILITOT, BILIDIR, IBILI in the last 72 hours. PT/INR No results for input(s): LABPROT, INR in the last 72 hours. Hepatitis Panel No results for input(s): HEPBSAG, HCVAB, HEPAIGM, HEPBIGM in the last 72 hours.  Studies/Results: DG CHEST PORT 1 VIEW  Result Date: 02/09/2021 CLINICAL DATA:  Wheezing.  History of COPD. EXAM: PORTABLE CHEST 1 VIEW COMPARISON:  02/08/2021 FINDINGS: Cardiac silhouette is normal in size. No mediastinal or hilar masses. There is hazy opacity at the right lung base partly silhouetting the lateral right hemidiaphragm. Remainder of the lungs is clear. Possible right pleural  effusion. No convincing left pleural effusion. No pneumothorax. Skeletal structures are grossly intact. IMPRESSION: 1. No definite change from the previous day's study allowing for differences in patient positioning and technique. 2. Opacity at the right lung base is likely a combination of a small effusion and atelectasis. Pneumonia should be considered if there are consistent clinical findings. 3. No evidence of pulmonary edema. Electronically Signed   By: Lajean Manes M.D.   On: 02/09/2021 16:00   DG CHEST PORT 1 VIEW  Result Date: 02/08/2021 CLINICAL DATA:  Cough, weakness. EXAM: PORTABLE CHEST 1 VIEW COMPARISON:  Chest radiograph dated 09/20/2007. FINDINGS: The heart size is normal. Vascular calcifications are seen in the aortic arch. There are mild bilateral lower lung predominant interstitial and airspace opacities. A small right pleural effusion may contribute. There is no left pleural effusion. There is no pneumothorax on either side. No acute osseous injury. IMPRESSION: Mild bilateral lower lung predominant interstitial and airspace opacities may represent pulmonary edema and/or pneumonia. A small right pleural effusion may contribute. Electronically Signed   By: Zerita Boers M.D.   On: 02/08/2021 17:34    Scheduled Meds:  sodium chloride   Intravenous Once   sodium chloride   Intravenous Once   atorvastatin  20 mg Oral Daily   chlorhexidine  15 mL Mouth Rinse BID   Chlorhexidine Gluconate Cloth  6 each Topical Daily  escitalopram  20 mg Oral Daily   fluticasone  2 spray Each Nare Daily   furosemide  40 mg Oral Daily   guaiFENesin  1,200 mg Oral BID   ipratropium-albuterol  3 mL Nebulization Q6H   levothyroxine  25 mcg Oral q morning   lidocaine  1 patch Transdermal Q24H   mouth rinse  15 mL Mouth Rinse q12n4p   mometasone-formoterol  2 puff Inhalation BID   pantoprazole (PROTONIX) IV  40 mg Intravenous Q12H   predniSONE  60 mg Oral Q breakfast   QUEtiapine  100 mg Oral QHS    romiPLOStim  2 mcg/kg Subcutaneous Weekly   rOPINIRole  0.5 mg Oral TID   Continuous Infusions:  sodium chloride     sodium chloride     sodium chloride     sodium chloride     sodium chloride     PRN Meds:.acetaminophen, oxyCODONE   ASSESMENT:   GI bleed, severe blood loss anemia in patient with ITP, NSAID use.  Protonix 40 IV bid in place.       ITP, platelets 5K on admission >> rallied to 32 >> back down to 12 this AM.  Need >/+ to 50 for EGD.   Dr Alen Blew following.  Received 2 days IVIG, 1 dose Nplate, continues on prednisone.  Advance to regular diet.    Switch to po PPI?   PLAN     No EGD until Platelets at or > 50 K.       Azucena Freed  02/10/2021, 11:35 AM Phone [phone]    Attending physician's note   I have taken an interval history, reviewed the chart and examined the patient. I agree with the Advanced Practitioner's note, impression and recommendations.   No further GI bleeding. Hb stable. Acute on chronic ITP (plt count down to 12 this morning) on prd/IVIG,Nplate  Plan: -Advance diet. -Hold off on EGD (would need ptl count> 50K) -Recommend to continue Protonix 40 mg p.o. QD -Avoid nonsteroidals -FU GI as outpt -Will sign off for now. Pl call if any questions or change in clinical status. -Appreciate hematology input. -D/W family  Carmell Austria, MD [facility] [phone]

## 2021-02-10 NOTE — Progress Notes (Signed)
PROGRESS NOTE    Jean Kramer  [id number] DOB: [birth year] DOA: 02/05/2021 PCP: [name], MD    Chief Complaint  Patient presents with   Weakness    Weakness for 3 weeks.    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 She is hypotensive on presentation with severe anemia, Hgb of 5, with platelet count of 5. S/p 1 unit of platelet transfusion, and NPlate infusion.   Assessment & Plan:   Active Problems:   Acute ITP (HCC)  GI bleed in the setting of thrombocytopenia:  Was on pressors transiently, weaned off and currently stable.  Hemorrhagic shock has resolved, off pressors on 7/22, transferred to hospitalist service on 7/23 No bleeding so far.  GI on board, recommended no EGD until platelets improve.    ITP:  S/P IVIG/steroid/Nplate infusion.  Platelets at 12,000 today.  Heme onc on board.    Acute on chronic diastolic CHF.  Improving. Continue with LASIX,  Strict intake and output.  Daily weights.  Monitor renal parameters.     COPD;  No wheezing on exam.  Continue to monitor.  Current smoker, smoking cessation counseling provided by provider.    Hypothyroidism:  Resume home meds.     Pressure injury.  Pressure Injury 02/05/21 Sacrum Left;Medial Deep Tissue Pressure Injury - Purple or maroon localized area of discolored intact skin or blood-filled blister due to damage of underlying soft tissue from pressure and/or shear. (Active)  02/05/21 2000  Location: Sacrum  Location Orientation: Left;Medial  Staging: Deep Tissue Pressure Injury - Purple or maroon localized area of discolored intact skin or blood-filled blister due to damage of underlying soft tissue from pressure and/or shear.  Wound Description (Comments):   Present on Admission: Yes  Resume wound care.            DVT prophylaxis: scd's Code Status: (Full/Partial - specify  details) Family Communication: (Specify name, relationship & date discussed. NO "discussed with patient") Disposition:   Status is: Inpatient  Remains inpatient appropriate because:Inpatient level of care appropriate due to severity of illness  Dispo: The patient is from: Home              Anticipated d/c is to: Home              Patient currently is not medically stable to d/c.   Difficult to place patient No       Consultants:  Hematology Gi.   Procedures: none.   Antimicrobials: none.    Subjective: No new complaints.   Objective: Vitals:   02/10/21 0446 02/10/21 0721 02/10/21 0758 02/10/21 1118  BP: 108/70 117/67  (!) 125/96  Pulse: 76 67  62  Resp: 18     Temp: 97.8 F (36.6 C) 98.3 F (36.8 C)  97.9 F (36.6 C)  TempSrc: Oral Oral  Oral  SpO2: 98% 98% 99% 98%  Weight: 80.8 kg       Intake/Output Summary (Last 24 hours) at 02/10/2021 1702 Last data filed at 02/10/2021 1525 Gross per 24 hour  Intake --  Output 3150 ml  Net -3150 ml   Filed Weights   02/08/21 0500 02/09/21 0423 02/10/21 0446  Weight: 84.8 kg 84.5 kg 80.8 kg    Examination:  General exam: Appears calm and comfortable  Respiratory system: Clear to auscultation. Respiratory effort normal. Cardiovascular system: S1 & S2 heard, RRR. No JVD,  murmurs, rubs, gallops or clicks. No pedal edema. Gastrointestinal system: Abdomen is nondistended, soft and nontender. No organomegaly or masses felt. Normal bowel sounds heard. Central nervous system: Alert and oriented. No focal neurological deficits. Extremities: Symmetric 5 x 5 power. Skin: No rashes, lesions or ulcers Psychiatry: Judgement and insight appear normal. Mood & affect appropriate.     Data Reviewed: I have personally reviewed following labs and imaging studies  CBC: Recent Labs  Lab 02/05/21 1403 02/05/21 1821 02/06/21 0156 02/06/21 0912 02/06/21 1556 02/07/21 0327 02/08/21 0310 02/09/21 0054 02/10/21 0657  WBC 34.1*   --  28.2*   < > 26.7* 24.8* 15.4* 12.7* 11.9*  NEUTROABS 23.8*  --  21.9*  --   --   --   --   --   --   HGB 4.0*   < > 10.3*   < > 9.7* 9.1* 9.4* 9.0* 10.0*  HCT 12.5*   < > 30.0*   < > 29.0* 27.4* 28.7* 27.9* 29.7*  MCV 114.7*  --  96.5   < > 99.7 100.7* 102.5* 103.0* 99.0  PLT 5*  --  9*   < > 32* 29* 32* 19* 12*   < > = values in this interval not displayed.    Basic Metabolic Panel: Recent Labs  Lab 02/05/21 1403 02/06/21 0156 02/07/21 0327 02/08/21 0310 02/10/21 0657  NA 141 142 137 134* 133*  K 3.8 3.5 3.5 4.6 3.8  CL 113* 112* 109 108 99  CO2 17* 21* '23 22 27  '$ GLUCOSE 152* 152* 121* 107* 72  BUN 42* 26* '22 22 22  '$ CREATININE 1.14* 0.85 0.88 0.79 0.77  CALCIUM 7.9* 8.4* 7.8* 8.2* 8.3*  MG  --   --   --   --  2.1    GFR: CrCl cannot be calculated (Unknown ideal weight.).  Liver Function Tests: Recent Labs  Lab 02/05/21 1403 02/06/21 0156 02/07/21 0327  AST 41 42* 45*  ALT 43 41 44  ALKPHOS 47 58 44  BILITOT 0.6 0.7 0.9  PROT 4.9* 5.8* 8.6*  ALBUMIN 2.3* 2.9* 2.6*    CBG: Recent Labs  Lab 02/05/21 1945  GLUCAP 110*     Recent Results (from the past 240 hour(s))  Resp Panel by RT-PCR (Flu A&B, Covid) Nasopharyngeal Swab     Status: None   Collection Time: 02/05/21  1:40 PM   Specimen: Nasopharyngeal Swab; Nasopharyngeal(NP) swabs in vial transport medium  Result Value Ref Range Status   SARS Coronavirus 2 by RT PCR NEGATIVE NEGATIVE Final    Comment: (NOTE) SARS-CoV-2 target nucleic acids are NOT DETECTED.  The SARS-CoV-2 RNA is generally detectable in upper respiratory specimens during the acute phase of infection. The lowest concentration of SARS-CoV-2 viral copies this assay can detect is 138 copies/mL. A negative result does not preclude SARS-Cov-2 infection and should not be used as the sole basis for treatment or other patient management decisions. A negative result may occur with  improper specimen collection/handling, submission of  specimen other than nasopharyngeal swab, presence of viral mutation(s) within the areas targeted by this assay, and inadequate number of viral copies(<138 copies/mL). A negative result must be combined with clinical observations, patient history, and epidemiological information. The expected result is Negative.  Fact Sheet for Patients:  EntrepreneurPulse.com.au  Fact Sheet for Healthcare Providers:  IncredibleEmployment.be  This test is no t yet approved or cleared by the [place] FDA and  has been authorized for detection and/or diagnosis of SARS-CoV-2  by FDA under an Emergency Use Authorization (EUA). This EUA will remain  in effect (meaning this test can be used) for the duration of the COVID-19 declaration under Section 564(b)(1) of the Act, 21 U.S.C.section 360bbb-3(b)(1), unless the authorization is terminated  or revoked sooner.       Influenza A by PCR NEGATIVE NEGATIVE Final   Influenza B by PCR NEGATIVE NEGATIVE Final    Comment: (NOTE) The Xpert Xpress SARS-CoV-2/FLU/RSV plus assay is intended as an aid in the diagnosis of influenza from Nasopharyngeal swab specimens and should not be used as a sole basis for treatment. Nasal washings and aspirates are unacceptable for Xpert Xpress SARS-CoV-2/FLU/RSV testing.  Fact Sheet for Patients: EntrepreneurPulse.com.au  Fact Sheet for Healthcare Providers: IncredibleEmployment.be  This test is not yet approved or cleared by the [place] FDA and has been authorized for detection and/or diagnosis of SARS-CoV-2 by FDA under an Emergency Use Authorization (EUA). This EUA will remain in effect (meaning this test can be used) for the duration of the COVID-19 declaration under Section 564(b)(1) of the Act, 21 U.S.C. section 360bbb-3(b)(1), unless the authorization is terminated or revoked.  Performed at [facility], [place] [address]  [place], [place] [address]   MRSA Next Gen by PCR, Nasal     Status: None   Collection Time: 02/06/21  4:14 AM   Specimen: Nasal Mucosa; Nasal Swab  Result Value Ref Range Status   MRSA by PCR Next Gen NOT DETECTED NOT DETECTED Final    Comment: (NOTE) The GeneXpert MRSA Assay (FDA approved for NASAL specimens only), is one component of a comprehensive MRSA colonization surveillance program. It is not intended to diagnose MRSA infection nor to guide or monitor treatment for MRSA infections. Test performance is not FDA approved in patients less than 47 years old. Performed at [facility], [place] [address], [place], [place] [address]          Radiology Studies: DG CHEST PORT 1 VIEW  Result Date: 02/09/2021 CLINICAL DATA:  Wheezing.  History of COPD. EXAM: PORTABLE CHEST 1 VIEW COMPARISON:  02/08/2021 FINDINGS: Cardiac silhouette is normal in size. No mediastinal or hilar masses. There is hazy opacity at the right lung base partly silhouetting the lateral right hemidiaphragm. Remainder of the lungs is clear. Possible right pleural effusion. No convincing left pleural effusion. No pneumothorax. Skeletal structures are grossly intact. IMPRESSION: 1. No definite change from the previous day's study allowing for differences in patient positioning and technique. 2. Opacity at the right lung base is likely a combination of a small effusion and atelectasis. Pneumonia should be considered if there are consistent clinical findings. 3. No evidence of pulmonary edema. Electronically Signed   By: Lajean Manes M.D.   On: 02/09/2021 16:00        Scheduled Meds:  sodium chloride   Intravenous Once   sodium chloride   Intravenous Once   atorvastatin  20 mg Oral Daily   chlorhexidine  15 mL Mouth Rinse BID   Chlorhexidine Gluconate Cloth  6 each Topical Daily   escitalopram  20 mg Oral Daily   fluticasone  2 spray Each Nare Daily   furosemide  40 mg Oral Daily   guaiFENesin  1,200 mg Oral BID    ipratropium-albuterol  3 mL Nebulization Q6H   levothyroxine  25 mcg Oral q morning   lidocaine  1 patch Transdermal Q24H   mouth rinse  15 mL Mouth Rinse q12n4p   mometasone-formoterol  2 puff Inhalation BID   pantoprazole  40 mg Oral BID   predniSONE  60 mg Oral Q breakfast   QUEtiapine  100 mg Oral QHS   romiPLOStim  2 mcg/kg Subcutaneous Weekly   rOPINIRole  0.5 mg Oral TID   Continuous Infusions:  sodium chloride     sodium chloride     sodium chloride     sodium chloride     sodium chloride       LOS: 5 days        [name], MD [facility]   To contact the attending provider between 7A-7P or the covering provider during after hours 7P-7A, please log into the web site [email or web address] and access using universal [place] password for that web site. If you do not have the password, please call the hospital operator.  02/10/2021, 5:02 PM

## 2021-02-10 NOTE — Progress Notes (Signed)
HEMATOLOGY-ONCOLOGY PROGRESS NOTE  SUBJECTIVE: Ms. Chaiken reports no major changes overnight.  She denies any hematochezia, melena or hemoptysis.  She denies any petechiae or bruising.  Her phlebotomy site is oozing however.    PHYSICAL EXAMINATION:  Vitals:   02/10/21 0721 02/10/21 0758  BP: 117/67   Pulse: 67   Resp:    Temp: 98.3 F (36.8 C)   SpO2: 98% 99%   Filed Weights   02/08/21 0500 02/09/21 0423 02/10/21 0446  Weight: 186 lb 15.2 oz (84.8 kg) 186 lb 4.6 oz (84.5 kg) 178 lb 2.1 oz (80.8 kg)    Intake/Output from previous day: 07/24 0701 - 07/25 0700 In: 480 [P.O.:480] Out: 3400 [Urine:3400]   General appearance: Comfortable appearing without any discomfort Head: Normocephalic without any trauma Oropharynx: Mucous membranes are moist and pink without any thrush or ulcers. Eyes: Pupils are equal and round reactive to light. Lymph nodes: No cervical, supraclavicular, inguinal or axillary lymphadenopathy.   Heart:regular rate and rhythm.  S1 and S2 without leg edema. Lung: Clear without any rhonchi or wheezes.  No dullness to percussion. Abdomin: Soft, nontender, nondistended with good bowel sounds.  No hepatosplenomegaly. Musculoskeletal: No joint deformity or effusion.  Full range of motion noted. Neurological: No deficits noted on motor, sensory and deep tendon reflex exam. Skin: No petechial rash or dryness.  Appeared moist.     LABORATORY DATA:  I have reviewed the data as listed CMP Latest Ref Rng & Units 02/10/2021 02/08/2021 02/07/2021  Glucose 70 - 99 mg/dL 72 107(H) 121(H)  BUN 8 - 23 mg/dL '22 22 22  '$ Creatinine 0.44 - 1.00 mg/dL 0.77 0.79 0.88  Sodium 135 - 145 mmol/L 133(L) 134(L) 137  Potassium 3.5 - 5.1 mmol/L 3.8 4.6 3.5  Chloride 98 - 111 mmol/L 99 108 109  CO2 22 - 32 mmol/L '27 22 23  '$ Calcium 8.9 - 10.3 mg/dL 8.3(L) 8.2(L) 7.8(L)  Total Protein 6.5 - 8.1 g/dL - - 8.6(H)  Total Bilirubin 0.3 - 1.2 mg/dL - - 0.9  Alkaline Phos 38 - 126 U/L - - 44   AST 15 - 41 U/L - - 45(H)  ALT 0 - 44 U/L - - 44    Lab Results  Component Value Date   WBC 11.9 (H) 02/10/2021   HGB 10.0 (L) 02/10/2021   HCT 29.7 (L) 02/10/2021   MCV 99.0 02/10/2021   PLT 12 (LL) 02/10/2021   NEUTROABS 21.9 (H) 02/06/2021     ASSESSMENT AND PLAN:  70 year old woman with:  1.  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  She is currently on prednisone and received 2 days of IVIG as well as 1 dose of Nplate.  Her platelet count remains low around 12,000 without any active bleeding.  After transient improvement in her platelets she appears to have developed refractory disease.  For the time being, I would continue with the current dose of prednisone and potentially repeat Nplate dosing in the next 48 hours at higher dose.  Platelet transfusion will be recommended if active bleeding is noted or platelet count of less than 10,000.   2.  Leukocytosis: Reactive in nature related to her acute illness and GI bleed.  Continues to improve.   3.  Anemia: Hemoglobin is stable without any active bleeding.  No transfusion is needed at this time.  4.  Disposition: I do not recommend discharge at this time given the instability  of her platelet count and potential recurrence of her GI bleeding.  If she responds to Nplate, she can be discharged to follow-up with Dr. Hinton Rao at [facility]. Will continue to follow while she is hospitalized.   35  minutes were spent on this encounter.  50% of the time was face-to-face and dedicated to reviewing laboratory data, disease status update, treatment choices and management options for the future.   LOS: 5 days   Zola Button, MD 02/10/21

## 2021-02-11 DIAGNOSIS — D62 Acute posthemorrhagic anemia: Secondary | ICD-10-CM | POA: Diagnosis not present

## 2021-02-11 DIAGNOSIS — R571 Hypovolemic shock: Secondary | ICD-10-CM | POA: Diagnosis not present

## 2021-02-11 DIAGNOSIS — D693 Immune thrombocytopenic purpura: Secondary | ICD-10-CM | POA: Diagnosis not present

## 2021-02-11 LAB — CBC
HCT: 26.2 % — ABNORMAL LOW (ref 36.0–46.0)
HCT: 35.3 % — ABNORMAL LOW (ref 36.0–46.0)
Hemoglobin: 9.1 g/dL — ABNORMAL LOW (ref 12.0–15.0)
Hemoglobin: 12 g/dL (ref 12.0–15.0)
MCH: 33.6 pg (ref 26.0–34.0)
MCH: 33.2 pg (ref 26.0–34.0)
MCHC: 34.7 g/dL (ref 30.0–36.0)
MCHC: 34 g/dL (ref 30.0–36.0)
MCV: 96.7 fL (ref 80.0–100.0)
MCV: 97.8 fL (ref 80.0–100.0)
Platelets: 10 10*3/uL — CL (ref 150–400)
Platelets: 21 10*3/uL — CL (ref 150–400)
RBC: 2.71 MIL/uL — ABNORMAL LOW (ref 3.87–5.11)
RBC: 3.61 MIL/uL — ABNORMAL LOW (ref 3.87–5.11)
RDW: 18.2 % — ABNORMAL HIGH (ref 11.5–15.5)
RDW: 18 % — ABNORMAL HIGH (ref 11.5–15.5)
WBC: 14.7 10*3/uL — ABNORMAL HIGH (ref 4.0–10.5)
WBC: 13.2 10*3/uL — ABNORMAL HIGH (ref 4.0–10.5)
nRBC: 0.1 % (ref 0.0–0.2)
nRBC: 0 % (ref 0.0–0.2)

## 2021-02-11 LAB — PROCALCITONIN: Procalcitonin: <0.1 ng/mL

## 2021-02-11 MED ORDER — IPRATROPIUM-ALBUTEROL 0.5-2.5 (3) MG/3ML IN SOLN
3.0000 mL | Freq: Two times a day (BID) | RESPIRATORY_TRACT | Status: DC
  Administered 2021-02-11 – 2021-02-17 (×12): 3 mL via RESPIRATORY_TRACT
  Filled 2021-02-11 (×12): qty 3

## 2021-02-11 MED ORDER — ROMIPLOSTIM INJECTION 500 MCG
5.0000 ug/kg | Freq: Once | SUBCUTANEOUS | Status: AC
  Administered 2021-02-11: 400 ug via SUBCUTANEOUS
  Filled 2021-02-11: qty 0.8

## 2021-02-11 MED ORDER — IPRATROPIUM-ALBUTEROL 0.5-2.5 (3) MG/3ML IN SOLN
3.0000 mL | Freq: Four times a day (QID) | RESPIRATORY_TRACT | Status: DC
  Administered 2021-02-11: 3 mL via RESPIRATORY_TRACT
  Filled 2021-02-11: qty 3

## 2021-02-11 NOTE — Progress Notes (Addendum)
CRITICAL VALUE STICKER  CRITICAL VALUE: platelets of 10  RECEIVER (on-site recipient of call): Virl Diamond  DATE & TIME NOTIFIED: 02/11/21   MESSENGER (representative from lab):  MD NOTIFIED: Marlowe Sax, MD  TIME OF NOTIFICATION: 0515  RESPONSE:  monitor for bleeding

## 2021-02-11 NOTE — Progress Notes (Signed)
PROGRESS NOTE    Jean Kramer  [id number] DOB: [birth year] DOA: 02/05/2021 PCP: [name], MD    Chief Complaint  Patient presents with   Weakness    Weakness for 3 weeks.    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 She is hypotensive on presentation with severe anemia, Hgb of 5, with platelet count of 5. S/p 1 unit of platelet transfusion, and NPlate infusion.   Assessment & Plan:   Active Problems:   Acute ITP (HCC)  GI bleed in the setting of thrombocytopenia:  Was on pressors transiently, weaned off and currently stable.  Hemorrhagic shock has resolved, off pressors on 7/22, transferred to hospitalist service on 7/23 No bleeding so far.  GI on board, recommended no EGD until platelets improve.    Anemia of blood loss possibly from GI bleed.  Hemoglobin stable around 9.  Transfuse to keep hemoglobin greater than 7.  Appreciate GI recommendations.   ITP:  S/P IVIG/steroid/Nplate infusion.  Platelets at 10,000 today. Recheck CBC later today.  Higher dose Nplate infusion ordered by heme Onc. No bleeding evident today. No nausea, vomiting or headache or hematuria.  Heme onc on board.    Acute on chronic diastolic CHF.  Improving. Continue with LASIX, check renal parameters in am.  Strict intake and output.  Daily weights.  Monitor renal parameters.     COPD;  No wheezing on exam.  Continue to monitor.  Current smoker, smoking cessation counseling provided by provider.    Hypothyroidism:  Resume home meds.   Mild hyponatremia:  Asymptomatic.     Leukocytosis:  Probably from steroids.  Pro calcitonin is negative. She remains afebrile,  CXR does not show obvious pneumonia. She denies any sob or cough.     Pressure injury.  Pressure Injury 02/05/21 Sacrum Left;Medial Deep Tissue Pressure Injury - Purple or maroon localized area of  discolored intact skin or blood-filled blister due to damage of underlying soft tissue from pressure and/or shear. (Active)  02/05/21 2000  Location: Sacrum  Location Orientation: Left;Medial  Staging: Deep Tissue Pressure Injury - Purple or maroon localized area of discolored intact skin or blood-filled blister due to damage of underlying soft tissue from pressure and/or shear.  Wound Description (Comments):   Present on Admission: Yes  Resume wound care.      DVT prophylaxis: scd's Code Status: full code.  Family Communication: family at bedside.  Disposition:   Status is: Inpatient  Remains inpatient appropriate because:Inpatient level of care appropriate due to severity of illness  Dispo: The patient is from: Home              Anticipated d/c is to: Home              Patient currently is not medically stable to d/c.   Difficult to place patient No       Consultants:  Hematology Gi.   Procedures: none.   Antimicrobials: none.    Subjective: Reports feeling tired. No headache, or nausea or vomiting.   Objective: Vitals:   02/10/21 2053 02/11/21 0600 02/11/21 0804 02/11/21 1144  BP:  111/68 127/90 121/62  Pulse:  72 70 66  Resp:  18  16  Temp:  98.2 F (36.8 C) 98.1 F (36.7 C) 98.1 F (36.7 C)  TempSrc:  Oral Oral Oral  SpO2: 97% 99% 98% 96%  Weight:  80.4  kg      Intake/Output Summary (Last 24 hours) at 02/11/2021 1405 Last data filed at 02/11/2021 1237 Gross per 24 hour  Intake 140 ml  Output 1800 ml  Net -1660 ml    Filed Weights   02/09/21 0423 02/10/21 0446 02/11/21 0600  Weight: 84.5 kg 80.8 kg 80.4 kg    Examination:  General exam: well developed female, not in distress.  Respiratory system: air entry fair bilateral, no wheezing heard.  Cardiovascular system: RRR no JVD, no pedal edema.  Gastrointestinal system: Abdomen is soft, NT ND BS+ Central nervous system: Alert and oriented, nonfoca.  Extremities:  no pedal edema.  Skin: No  ulcers seen.  Psychiatry: MOod is appropriate.     Data Reviewed: I have personally reviewed following labs and imaging studies  CBC: Recent Labs  Lab 02/05/21 1403 02/05/21 1821 02/06/21 0156 02/06/21 0912 02/07/21 0327 02/08/21 0310 02/09/21 0054 02/10/21 0657 02/11/21 0351  WBC 34.1*  --  28.2*   < > 24.8* 15.4* 12.7* 11.9* 14.7*  NEUTROABS 23.8*  --  21.9*  --   --   --   --   --   --   HGB 4.0*   < > 10.3*   < > 9.1* 9.4* 9.0* 10.0* 9.1*  HCT 12.5*   < > 30.0*   < > 27.4* 28.7* 27.9* 29.7* 26.2*  MCV 114.7*  --  96.5   < > 100.7* 102.5* 103.0* 99.0 96.7  PLT 5*  --  9*   < > 29* 32* 19* 12* 10*   < > = values in this interval not displayed.     Basic Metabolic Panel: Recent Labs  Lab 02/05/21 1403 02/06/21 0156 02/07/21 0327 02/08/21 0310 02/10/21 0657  NA 141 142 137 134* 133*  K 3.8 3.5 3.5 4.6 3.8  CL 113* 112* 109 108 99  CO2 17* 21* '23 22 27  '$ GLUCOSE 152* 152* 121* 107* 72  BUN 42* 26* '22 22 22  '$ CREATININE 1.14* 0.85 0.88 0.79 0.77  CALCIUM 7.9* 8.4* 7.8* 8.2* 8.3*  MG  --   --   --   --  2.1     GFR: CrCl cannot be calculated (Unknown ideal weight.).  Liver Function Tests: Recent Labs  Lab 02/05/21 1403 02/06/21 0156 02/07/21 0327  AST 41 42* 45*  ALT 43 41 44  ALKPHOS 47 58 44  BILITOT 0.6 0.7 0.9  PROT 4.9* 5.8* 8.6*  ALBUMIN 2.3* 2.9* 2.6*     CBG: Recent Labs  Lab 02/05/21 1945  GLUCAP 110*      Recent Results (from the past 240 hour(s))  Resp Panel by RT-PCR (Flu A&B, Covid) Nasopharyngeal Swab     Status: None   Collection Time: 02/05/21  1:40 PM   Specimen: Nasopharyngeal Swab; Nasopharyngeal(NP) swabs in vial transport medium  Result Value Ref Range Status   SARS Coronavirus 2 by RT PCR NEGATIVE NEGATIVE Final    Comment: (NOTE) SARS-CoV-2 target nucleic acids are NOT DETECTED.  The SARS-CoV-2 RNA is generally detectable in upper respiratory specimens during the acute phase of infection. The lowest concentration  of SARS-CoV-2 viral copies this assay can detect is 138 copies/mL. A negative result does not preclude SARS-Cov-2 infection and should not be used as the sole basis for treatment or other patient management decisions. A negative result may occur with  improper specimen collection/handling, submission of specimen other than nasopharyngeal swab, presence of viral mutation(s) within the areas targeted by this  assay, and inadequate number of viral copies(<138 copies/mL). A negative result must be combined with clinical observations, patient history, and epidemiological information. The expected result is Negative.  Fact Sheet for Patients:  EntrepreneurPulse.com.au  Fact Sheet for Healthcare Providers:  IncredibleEmployment.be  This test is no t yet approved or cleared by the [place] FDA and  has been authorized for detection and/or diagnosis of SARS-CoV-2 by FDA under an Emergency Use Authorization (EUA). This EUA will remain  in effect (meaning this test can be used) for the duration of the COVID-19 declaration under Section 564(b)(1) of the Act, 21 U.S.C.section 360bbb-3(b)(1), unless the authorization is terminated  or revoked sooner.       Influenza A by PCR NEGATIVE NEGATIVE Final   Influenza B by PCR NEGATIVE NEGATIVE Final    Comment: (NOTE) The Xpert Xpress SARS-CoV-2/FLU/RSV plus assay is intended as an aid in the diagnosis of influenza from Nasopharyngeal swab specimens and should not be used as a sole basis for treatment. Nasal washings and aspirates are unacceptable for Xpert Xpress SARS-CoV-2/FLU/RSV testing.  Fact Sheet for Patients: EntrepreneurPulse.com.au  Fact Sheet for Healthcare Providers: IncredibleEmployment.be  This test is not yet approved or cleared by the [place] FDA and has been authorized for detection and/or diagnosis of SARS-CoV-2 by FDA under an Emergency Use  Authorization (EUA). This EUA will remain in effect (meaning this test can be used) for the duration of the COVID-19 declaration under Section 564(b)(1) of the Act, 21 U.S.C. section 360bbb-3(b)(1), unless the authorization is terminated or revoked.  Performed at [facility], [place] [address], [place], [place] [address]   MRSA Next Gen by PCR, Nasal     Status: None   Collection Time: 02/06/21  4:14 AM   Specimen: Nasal Mucosa; Nasal Swab  Result Value Ref Range Status   MRSA by PCR Next Gen NOT DETECTED NOT DETECTED Final    Comment: (NOTE) The GeneXpert MRSA Assay (FDA approved for NASAL specimens only), is one component of a comprehensive MRSA colonization surveillance program. It is not intended to diagnose MRSA infection nor to guide or monitor treatment for MRSA infections. Test performance is not FDA approved in patients less than 42 years old. Performed at [facility], [place] [address] [place],  [address]           Radiology Studies: DG CHEST PORT 1 VIEW  Result Date: 02/09/2021 CLINICAL DATA:  Wheezing.  History of COPD. EXAM: PORTABLE CHEST 1 VIEW COMPARISON:  02/08/2021 FINDINGS: Cardiac silhouette is normal in size. No mediastinal or hilar masses. There is hazy opacity at the right lung base partly silhouetting the lateral right hemidiaphragm. Remainder of the lungs is clear. Possible right pleural effusion. No convincing left pleural effusion. No pneumothorax. Skeletal structures are grossly intact. IMPRESSION: 1. No definite change from the previous day's study allowing for differences in patient positioning and technique. 2. Opacity at the right lung base is likely a combination of a small effusion and atelectasis. Pneumonia should be considered if there are consistent clinical findings. 3. No evidence of pulmonary edema. Electronically Signed   By: Lajean Manes M.D.   On: 02/09/2021 16:00        Scheduled Meds:  sodium chloride   Intravenous  Once   sodium chloride   Intravenous Once   atorvastatin  20 mg Oral Daily   chlorhexidine  15 mL Mouth Rinse BID   escitalopram  20 mg Oral Daily   fluticasone  2  spray Each Nare Daily   furosemide  40 mg Oral Daily   guaiFENesin  1,200 mg Oral BID   ipratropium-albuterol  3 mL Nebulization BID   levothyroxine  25 mcg Oral q morning   lidocaine  1 patch Transdermal Q24H   mouth rinse  15 mL Mouth Rinse q12n4p   mometasone-formoterol  2 puff Inhalation BID   pantoprazole  40 mg Oral BID   predniSONE  60 mg Oral Q breakfast   QUEtiapine  100 mg Oral QHS   romiPLOStim  2 mcg/kg Subcutaneous Weekly   rOPINIRole  0.5 mg Oral TID   Continuous Infusions:  sodium chloride     sodium chloride     sodium chloride     sodium chloride     sodium chloride       LOS: 6 days        [name], MD [facility]   To contact the attending provider between 7A-7P or the covering provider during after hours 7P-7A, please log into the web site [email or web address] and access using universal [place] password for that web site. If you do not have the password, please call the hospital operator.  02/11/2021, 2:05 PM

## 2021-02-11 NOTE — Progress Notes (Signed)
Laboratory data reviewed from today.  Her platelet count continue to drop without any response to IVIG or steroids.  No active bleeding noted with hemoglobin overall stable.   For the time being, I recommend repeating now Nplate dosing on a higher dose.  Transfuse platelets for platelet count less than 10 or active bleeding.  Will continue to monitor.

## 2021-02-12 DIAGNOSIS — D693 Immune thrombocytopenic purpura: Secondary | ICD-10-CM | POA: Diagnosis not present

## 2021-02-12 DIAGNOSIS — R571 Hypovolemic shock: Secondary | ICD-10-CM | POA: Diagnosis not present

## 2021-02-12 DIAGNOSIS — D62 Acute posthemorrhagic anemia: Secondary | ICD-10-CM | POA: Diagnosis not present

## 2021-02-12 LAB — BASIC METABOLIC PANEL
Anion gap: 8 (ref 5–15)
BUN: 21 mg/dL (ref 8–23)
CO2: 27 mmol/L (ref 22–32)
Calcium: 8.8 mg/dL — ABNORMAL LOW (ref 8.9–10.3)
Chloride: 98 mmol/L (ref 98–111)
Creatinine, Ser: 0.77 mg/dL (ref 0.44–1.00)
GFR, Estimated: >60 mL/min (ref >60)
Glucose, Bld: 96 mg/dL (ref 70–99)
Potassium: 3.7 mmol/L (ref 3.5–5.1)
Sodium: 133 mmol/L — ABNORMAL LOW (ref 135–145)

## 2021-02-12 LAB — CBC
HCT: 35.4 % — ABNORMAL LOW (ref 36.0–46.0)
Hemoglobin: 11.9 g/dL — ABNORMAL LOW (ref 12.0–15.0)
MCH: 32.8 pg (ref 26.0–34.0)
MCHC: 33.6 g/dL (ref 30.0–36.0)
MCV: 97.5 fL (ref 80.0–100.0)
Platelets: 15 10*3/uL — CL (ref 150–400)
RBC: 3.63 MIL/uL — ABNORMAL LOW (ref 3.87–5.11)
RDW: 17.9 % — ABNORMAL HIGH (ref 11.5–15.5)
WBC: 16 10*3/uL — ABNORMAL HIGH (ref 4.0–10.5)
nRBC: 0 % (ref 0.0–0.2)

## 2021-02-12 MED ORDER — DOCUSATE SODIUM 50 MG PO CAPS
50.0000 mg | ORAL_CAPSULE | Freq: Every day | ORAL | Status: DC | PRN
  Administered 2021-02-12 – 2021-02-18 (×4): 50 mg via ORAL
  Filled 2021-02-12 (×5): qty 1

## 2021-02-12 NOTE — Progress Notes (Signed)
PROGRESS NOTE    Jean Kramer  [id number] DOB: [birth year] DOA: 02/05/2021 PCP: [name], MD    Chief Complaint  Patient presents with   Weakness    Weakness for 3 weeks.    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 She is hypotensive on presentation with severe anemia, Hgb of 5, with platelet count of 5. S/p 1 unit of platelet transfusion, and NPlate infusion.   Assessment & Plan:   Active Problems:   Acute ITP (HCC)  GI bleed in the setting of thrombocytopenia:  Was on pressors transiently, weaned off and currently stable.  Hemorrhagic shock has resolved, off pressors on 7/22, transferred to hospitalist service on 7/23 No bleeding so far.  GI on board, recommended no EGD until platelets improve.  Hemoglobin stable around 11.9.    Anemia of blood loss possibly from GI bleed.  Hemoglobin stable around 11.9 Transfuse to keep hemoglobin greater than 7.  Appreciate GI recommendations.   ITP:  S/P IVIG/steroid/Nplate infusion.  Platelets improved to 15,000.  Higher dose Nplate infusion ordered by heme Onc. No bleeding evident today. No nausea, vomiting or headache or hematuria.  Heme onc on board.    Acute on chronic diastolic CHF.  Improving. Continue with LASIX, creatinine back to baseline.  Strict intake and output.  Daily weights.  Monitor renal parameters.     COPD;  No wheezing on exam.  Continue to monitor.  Current smoker, smoking cessation counseling provided by provider.    Hypothyroidism:  Resume home meds.   Mild hyponatremia:  Asymptomatic.     Leukocytosis:  Probably from steroids.  Pro calcitonin is negative. She remains afebrile,  CXR does not show obvious pneumonia. She denies any sob or cough.     Pressure injury.  Pressure Injury 02/05/21 Sacrum Left;Medial Deep Tissue Pressure Injury - Purple or maroon localized area  of discolored intact skin or blood-filled blister due to damage of underlying soft tissue from pressure and/or shear. (Active)  02/05/21 2000  Location: Sacrum  Location Orientation: Left;Medial  Staging: Deep Tissue Pressure Injury - Purple or maroon localized area of discolored intact skin or blood-filled blister due to damage of underlying soft tissue from pressure and/or shear.  Wound Description (Comments):   Present on Admission: Yes  Resume wound care.      DVT prophylaxis: scd's Code Status: full code.  Family Communication: none at bedside.  Disposition:   Status is: Inpatient  Remains inpatient appropriate because:Inpatient level of care appropriate due to severity of illness  Dispo: The patient is from: Home              Anticipated d/c is to: Home              Patient currently is not medically stable to d/c.   Difficult to place patient No       Consultants:  Hematology Gi.   Procedures: none.   Antimicrobials: none.    Subjective: No new complaints.   Objective: Vitals:   02/11/21 2029 02/12/21 0436 02/12/21 0730 02/12/21 1124  BP:  121/85 125/72 108/67  Pulse:  60 71 75  Resp:  20  16  Temp:  98.3 F (36.8 C) 98.1 F (36.7 C) 98.2 F (36.8 C)  TempSrc:  Oral Oral Oral  SpO2: 94% 95% 97% 93%  Weight:  78.8 kg      Intake/Output  Summary (Last 24 hours) at 02/12/2021 1325 Last data filed at 02/12/2021 1300 Gross per 24 hour  Intake 700 ml  Output 2200 ml  Net -1500 ml    Filed Weights   02/10/21 0446 02/11/21 0600 02/12/21 0436  Weight: 80.8 kg 80.4 kg 78.8 kg    Examination:  General exam:  well developed lady, not in distress.  Respiratory system: clear to auscultation, no wheezing or rhonchi.  Cardiovascular system: RRR no JVD, no pedal edema.  Gastrointestinal system: Abdomen is soft, non tender non distended bowel sounds wnl Central nervous system: Alert and oriented, non focal.  Extremities:  no pedal edema.  Skin: No  ulcers.  Psychiatry: Mood is appropriate.     Data Reviewed: I have personally reviewed following labs and imaging studies  CBC: Recent Labs  Lab 02/05/21 1403 02/05/21 1821 02/06/21 0156 02/06/21 0912 02/09/21 0054 02/10/21 0657 02/11/21 0351 02/11/21 1838 02/12/21 0244  WBC 34.1*  --  28.2*   < > 12.7* 11.9* 14.7* 13.2* 16.0*  NEUTROABS 23.8*  --  21.9*  --   --   --   --   --   --   HGB 4.0*   < > 10.3*   < > 9.0* 10.0* 9.1* 12.0 11.9*  HCT 12.5*   < > 30.0*   < > 27.9* 29.7* 26.2* 35.3* 35.4*  MCV 114.7*  --  96.5   < > 103.0* 99.0 96.7 97.8 97.5  PLT 5*  --  9*   < > 19* 12* 10* 21* 15*   < > = values in this interval not displayed.     Basic Metabolic Panel: Recent Labs  Lab 02/06/21 0156 02/07/21 0327 02/08/21 0310 02/10/21 0657 02/12/21 0244  NA 142 137 134* 133* 133*  K 3.5 3.5 4.6 3.8 3.7  CL 112* 109 108 99 98  CO2 21* '23 22 27 27  '$ GLUCOSE 152* 121* 107* 72 96  BUN 26* '22 22 22 21  '$ CREATININE 0.85 0.88 0.79 0.77 0.77  CALCIUM 8.4* 7.8* 8.2* 8.3* 8.8*  MG  --   --   --  2.1  --      GFR: CrCl cannot be calculated (Unknown ideal weight.).  Liver Function Tests: Recent Labs  Lab 02/05/21 1403 02/06/21 0156 02/07/21 0327  AST 41 42* 45*  ALT 43 41 44  ALKPHOS 47 58 44  BILITOT 0.6 0.7 0.9  PROT 4.9* 5.8* 8.6*  ALBUMIN 2.3* 2.9* 2.6*     CBG: Recent Labs  Lab 02/05/21 1945  GLUCAP 110*      Recent Results (from the past 240 hour(s))  Resp Panel by RT-PCR (Flu A&B, Covid) Nasopharyngeal Swab     Status: None   Collection Time: 02/05/21  1:40 PM   Specimen: Nasopharyngeal Swab; Nasopharyngeal(NP) swabs in vial transport medium  Result Value Ref Range Status   SARS Coronavirus 2 by RT PCR NEGATIVE NEGATIVE Final    Comment: (NOTE) SARS-CoV-2 target nucleic acids are NOT DETECTED.  The SARS-CoV-2 RNA is generally detectable in upper respiratory specimens during the acute phase of infection. The lowest concentration of SARS-CoV-2  viral copies this assay can detect is 138 copies/mL. A negative result does not preclude SARS-Cov-2 infection and should not be used as the sole basis for treatment or other patient management decisions. A negative result may occur with  improper specimen collection/handling, submission of specimen other than nasopharyngeal swab, presence of viral mutation(s) within the areas targeted by this assay, and  inadequate number of viral copies(<138 copies/mL). A negative result must be combined with clinical observations, patient history, and epidemiological information. The expected result is Negative.  Fact Sheet for Patients:  EntrepreneurPulse.com.au  Fact Sheet for Healthcare Providers:  IncredibleEmployment.be  This test is no t yet approved or cleared by the [place] FDA and  has been authorized for detection and/or diagnosis of SARS-CoV-2 by FDA under an Emergency Use Authorization (EUA). This EUA will remain  in effect (meaning this test can be used) for the duration of the COVID-19 declaration under Section 564(b)(1) of the Act, 21 U.S.C.section 360bbb-3(b)(1), unless the authorization is terminated  or revoked sooner.       Influenza A by PCR NEGATIVE NEGATIVE Final   Influenza B by PCR NEGATIVE NEGATIVE Final    Comment: (NOTE) The Xpert Xpress SARS-CoV-2/FLU/RSV plus assay is intended as an aid in the diagnosis of influenza from Nasopharyngeal swab specimens and should not be used as a sole basis for treatment. Nasal washings and aspirates are unacceptable for Xpert Xpress SARS-CoV-2/FLU/RSV testing.  Fact Sheet for Patients: EntrepreneurPulse.com.au  Fact Sheet for Healthcare Providers: IncredibleEmployment.be  This test is not yet approved or cleared by the [place] FDA and has been authorized for detection and/or diagnosis of SARS-CoV-2 by FDA under an Emergency Use Authorization  (EUA). This EUA will remain in effect (meaning this test can be used) for the duration of the COVID-19 declaration under Section 564(b)(1) of the Act, 21 U.S.C. section 360bbb-3(b)(1), unless the authorization is terminated or revoked.  Performed at [facility], [place] [address] [place], [place] [address]   MRSA Next Gen by PCR, Nasal     Status: None   Collection Time: 02/06/21  4:14 AM   Specimen: Nasal Mucosa; Nasal Swab  Result Value Ref Range Status   MRSA by PCR Next Gen NOT DETECTED NOT DETECTED Final    Comment: (NOTE) The GeneXpert MRSA Assay (FDA approved for NASAL specimens only), is one component of a comprehensive MRSA colonization surveillance program. It is not intended to diagnose MRSA infection nor to guide or monitor treatment for MRSA infections. Test performance is not FDA approved in patients less than 42 years old. Performed at [facility], [place] [address] [place], [place] [address]           Radiology Studies: No results found.      Scheduled Meds:  sodium chloride   Intravenous Once   sodium chloride   Intravenous Once   atorvastatin  20 mg Oral Daily   chlorhexidine  15 mL Mouth Rinse BID   escitalopram  20 mg Oral Daily   fluticasone  2 spray Each Nare Daily   furosemide  40 mg Oral Daily   guaiFENesin  1,200 mg Oral BID   ipratropium-albuterol  3 mL Nebulization BID   levothyroxine  25 mcg Oral q morning   lidocaine  1 patch Transdermal Q24H   mouth rinse  15 mL Mouth Rinse q12n4p   mometasone-formoterol  2 puff Inhalation BID   pantoprazole  40 mg Oral BID   predniSONE  60 mg Oral Q breakfast   QUEtiapine  100 mg Oral QHS   romiPLOStim  2 mcg/kg Subcutaneous Weekly   rOPINIRole  0.5 mg Oral TID   Continuous Infusions:  sodium chloride     sodium chloride     sodium chloride     sodium chloride     sodium chloride  LOS: 7 days        [name], MD [facility]   To contact the attending  provider between 7A-7P or the covering provider during after hours 7P-7A, please log into the web site [email or web address] and access using universal [place] password for that web site. If you do not have the password, please call the hospital operator.  02/12/2021, 1:25 PM

## 2021-02-12 NOTE — Progress Notes (Signed)
Laboratory data reviewed.  Her platelets remain low but improved with transfusion.  Count today is 15,000 without any evidence of bleeding.  Hemoglobin remains stable at 11.9.  She received a second dose of Nplate on July 26, [id number].  So far no platelet recovery noted at this time.  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  Currently on prednisone and has received IVIG and Nplate.  I recommend no transfusions today and continue to monitor daily platelets.  If no platelet recovery noted in the next 24 to 48 hours, she will receive another Nplate dose at 10 mcg/kg.  If her platelets start to recover, she can be discharged with a platelet count over 50 or close to what with follow-up with Dr. Hinton Rao.  She can also start prednisone taper at that time.  Will continue to follow.

## 2021-02-12 NOTE — Plan of Care (Signed)

## 2021-02-12 NOTE — Plan of Care (Signed)

## 2021-02-13 DIAGNOSIS — R571 Hypovolemic shock: Secondary | ICD-10-CM | POA: Diagnosis not present

## 2021-02-13 DIAGNOSIS — D693 Immune thrombocytopenic purpura: Secondary | ICD-10-CM | POA: Diagnosis not present

## 2021-02-13 DIAGNOSIS — D62 Acute posthemorrhagic anemia: Secondary | ICD-10-CM | POA: Diagnosis not present

## 2021-02-13 LAB — CBC WITH DIFFERENTIAL/PLATELET
Abs Immature Granulocytes: 0.1 10*3/uL — ABNORMAL HIGH (ref 0.00–0.07)
Abs Immature Granulocytes: 0.09 10*3/uL — ABNORMAL HIGH (ref 0.00–0.07)
Basophils Absolute: 0 10*3/uL (ref 0.0–0.1)
Basophils Absolute: 0 10*3/uL (ref 0.0–0.1)
Basophils Relative: 0 %
Basophils Relative: 0 %
Eosinophils Absolute: 0 10*3/uL (ref 0.0–0.5)
Eosinophils Absolute: 0 10*3/uL (ref 0.0–0.5)
Eosinophils Relative: 0 %
Eosinophils Relative: 0 %
HCT: 36.5 % (ref 36.0–46.0)
HCT: 39.4 % (ref 36.0–46.0)
Hemoglobin: 12.3 g/dL (ref 12.0–15.0)
Hemoglobin: 13.3 g/dL (ref 12.0–15.0)
Immature Granulocytes: 1 %
Immature Granulocytes: 1 %
Lymphocytes Relative: 29 %
Lymphocytes Relative: 7 %
Lymphs Abs: 4.9 10*3/uL — ABNORMAL HIGH (ref 0.7–4.0)
Lymphs Abs: 0.8 10*3/uL (ref 0.7–4.0)
MCH: 32.6 pg (ref 26.0–34.0)
MCH: 33 pg (ref 26.0–34.0)
MCHC: 33.7 g/dL (ref 30.0–36.0)
MCHC: 33.8 g/dL (ref 30.0–36.0)
MCV: 96.8 fL (ref 80.0–100.0)
MCV: 97.8 fL (ref 80.0–100.0)
Monocytes Absolute: 2 10*3/uL — ABNORMAL HIGH (ref 0.1–1.0)
Monocytes Absolute: 0.6 10*3/uL (ref 0.1–1.0)
Monocytes Relative: 12 %
Monocytes Relative: 5 %
Neutro Abs: 9.8 10*3/uL — ABNORMAL HIGH (ref 1.7–7.7)
Neutro Abs: 10.4 10*3/uL — ABNORMAL HIGH (ref 1.7–7.7)
Neutrophils Relative %: 58 %
Neutrophils Relative %: 87 %
Platelets: 18 10*3/uL — CL (ref 150–400)
Platelets: 36 10*3/uL — ABNORMAL LOW (ref 150–400)
RBC: 3.77 MIL/uL — ABNORMAL LOW (ref 3.87–5.11)
RBC: 4.03 MIL/uL (ref 3.87–5.11)
RDW: 17.8 % — ABNORMAL HIGH (ref 11.5–15.5)
RDW: 17.9 % — ABNORMAL HIGH (ref 11.5–15.5)
WBC: 16.9 10*3/uL — ABNORMAL HIGH (ref 4.0–10.5)
WBC: 11.9 10*3/uL — ABNORMAL HIGH (ref 4.0–10.5)
nRBC: 0 % (ref 0.0–0.2)
nRBC: 0 % (ref 0.0–0.2)

## 2021-02-13 NOTE — Plan of Care (Signed)

## 2021-02-13 NOTE — Plan of Care (Signed)

## 2021-02-13 NOTE — Progress Notes (Signed)
Brief Oncology Progress Note   HPI: Jean Kramer is a 70 year old female who presented with history of ITP who presented s/p fall from standing and was found to have Plt of 5.  S: On exam today Jean Kramer notes that she has been well overnight.  She denies any overt signs of bleeding, bruising, or dark stools.  She notes that her energy is good and she is optimistic about the increase in her platelet count.  She otherwise is asymptomatic.  O:  Vitals:   02/13/21 2059 02/13/21 2100  BP:    Pulse:    Resp:    Temp:    SpO2: 95% 95%   GENERAL: Well-appearing elderly Caucasian female, alert, no distress and comfortable SKIN: skin color, texture, turgor are normal, no rashes or significant lesions EYES: conjunctiva are pink and non-injected, sclera clear LUNGS: clear to auscultation and percussion with normal breathing effort HEART: regular rate & rhythm and no murmurs and no lower extremity edema PSYCH: alert & oriented x 3, fluent speech NEURO: no focal motor/sensory deficits  Laboratory data reviewed.  Her platelets remain low but improved with transfusion.  Count today is 36,000 without any evidence of bleeding.  Hemoglobin remains stable at 13.3.  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  Currently on prednisone and has received IVIG and Nplate.  I recommend no transfusions today and continue to monitor daily platelets. Platelets are currently rising. Will monitor for continued upward trend.   If her platelets start to recover, she can be discharged with a platelet count over 50 or close, plan for follow-up with Dr. Hinton Rao.  She can also start prednisone taper at that time.  Will continue to follow.  Ledell Peoples, MD Department of Hematology/Oncology [place] [facility] Phone: [phone] Pager: [phone] Email: [email or web address]

## 2021-02-13 NOTE — Progress Notes (Signed)
PROGRESS NOTE    Jean Kramer  [id number] DOB: [birth year] DOA: 02/05/2021 PCP: [name], MD    Chief Complaint  Patient presents with   Weakness    Weakness for 3 weeks.    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 She is hypotensive on presentation with severe anemia, Hgb of 5, with platelet count of 5. S/p 1 unit of platelet transfusion, and NPlate infusion.   Assessment & Plan:   Active Problems:   Acute ITP (HCC)  GI bleed in the setting of thrombocytopenia:  Was on pressors transiently, weaned off and currently stable.  Hemorrhagic shock has resolved, off pressors on 7/22, transferred to hospitalist service on 7/23 No bleeding so far.  GI on board, recommended no EGD until platelets improve.  Hemoglobin stable.    Anemia of blood loss possibly from GI bleed.  Hemoglobin improved.  Transfuse to keep hemoglobin greater than 7.  Appreciate GI recommendations.   ITP:  S/P IVIG/steroid/Nplate infusion.  Platelets improved to 18,000.  Higher dose Nplate infusion ordered by heme Onc. No bleeding evident today. No nausea, vomiting or headache or hematuria.  Heme onc on board.    Acute on chronic diastolic CHF.  Improving. Continue with LASIX, creatinine back to baseline.  Strict intake and output.  Daily weights.  Monitor renal parameters.     COPD;  No wheezing on exam.  Continue to monitor.  Current smoker, smoking cessation counseling provided by provider.    Hypothyroidism:  Resume home meds.   Mild hyponatremia:  Asymptomatic.  Sodium stable around 133.     Leukocytosis:  Probably from steroids.  Pro calcitonin is negative. She remains afebrile,  CXR does not show obvious pneumonia. She denies any sob or cough.     Pressure injury.  Pressure Injury 02/05/21 Sacrum Left;Medial Deep Tissue Pressure Injury - Purple or maroon localized  area of discolored intact skin or blood-filled blister due to damage of underlying soft tissue from pressure and/or shear. (Active)  02/05/21 2000  Location: Sacrum  Location Orientation: Left;Medial  Staging: Deep Tissue Pressure Injury - Purple or maroon localized area of discolored intact skin or blood-filled blister due to damage of underlying soft tissue from pressure and/or shear.  Wound Description (Comments):   Present on Admission: Yes  Resume wound care.      DVT prophylaxis: scd's Code Status: full code.  Family Communication: none at bedside.  Disposition:   Status is: Inpatient  Remains inpatient appropriate because:Inpatient level of care appropriate due to severity of illness  Dispo: The patient is from: Home              Anticipated d/c is to: Home              Patient currently is not medically stable to d/c.   Difficult to place patient No       Consultants:  Hematology Gi.   Procedures: none.   Antimicrobials: none.    Subjective: Hopeful her platelets would improve.   Objective: Vitals:   02/13/21 0357 02/13/21 0746 02/13/21 0838 02/13/21 1332  BP: 112/64  (!) 97/56 (!) 100/59  Pulse: 71 64 71 67  Resp: 18 16    Temp: 98.4 F (36.9 C)  98.3 F (36.8 C) 98.2 F (36.8 C)  TempSrc: Oral  Oral Oral  SpO2: 97% 98% 98% 95%  Weight:  Intake/Output Summary (Last 24 hours) at 02/13/2021 1337 Last data filed at 02/13/2021 1331 Gross per 24 hour  Intake 480 ml  Output 1575 ml  Net -1095 ml    Filed Weights   02/11/21 0600 02/12/21 0436 02/13/21 0018  Weight: 80.4 kg 78.8 kg 76.8 kg    Examination:  General exam: Alert and comfortable Respiratory system: Air entry fair bilateral no wheezing or rhonchi heart regular Cardiovascular system: Regular rate and rhythm, no JVD no pedal edema Gastrointestinal system: Abdomen is soft nontender nondistended bowel sounds normal Central nervous system: Alert and oriented Extremities:  no  pedal edema.  Skin: No ulcers.  Psychiatry: Mood is appropriate.     Data Reviewed: I have personally reviewed following labs and imaging studies  CBC: Recent Labs  Lab 02/10/21 0657 02/11/21 0351 02/11/21 1838 02/12/21 0244 02/13/21 0909  WBC 11.9* 14.7* 13.2* 16.0* 16.9*  NEUTROABS  --   --   --   --  9.8*  HGB 10.0* 9.1* 12.0 11.9* 12.3  HCT 29.7* 26.2* 35.3* 35.4* 36.5  MCV 99.0 96.7 97.8 97.5 96.8  PLT 12* 10* 21* 15* 18*     Basic Metabolic Panel: Recent Labs  Lab 02/07/21 0327 02/08/21 0310 02/10/21 0657 02/12/21 0244  NA 137 134* 133* 133*  K 3.5 4.6 3.8 3.7  CL 109 108 99 98  CO2 '23 22 27 27  '$ GLUCOSE 121* 107* 72 96  BUN '22 22 22 21  '$ CREATININE 0.88 0.79 0.77 0.77  CALCIUM 7.8* 8.2* 8.3* 8.8*  MG  --   --  2.1  --      GFR: CrCl cannot be calculated (Unknown ideal weight.).  Liver Function Tests: Recent Labs  Lab 02/07/21 0327  AST 45*  ALT 44  ALKPHOS 44  BILITOT 0.9  PROT 8.6*  ALBUMIN 2.6*     CBG: No results for input(s): GLUCAP in the last 168 hours.    Recent Results (from the past 240 hour(s))  Resp Panel by RT-PCR (Flu A&B, Covid) Nasopharyngeal Swab     Status: None   Collection Time: 02/05/21  1:40 PM   Specimen: Nasopharyngeal Swab; Nasopharyngeal(NP) swabs in vial transport medium  Result Value Ref Range Status   SARS Coronavirus 2 by RT PCR NEGATIVE NEGATIVE Final    Comment: (NOTE) SARS-CoV-2 target nucleic acids are NOT DETECTED.  The SARS-CoV-2 RNA is generally detectable in upper respiratory specimens during the acute phase of infection. The lowest concentration of SARS-CoV-2 viral copies this assay can detect is 138 copies/mL. A negative result does not preclude SARS-Cov-2 infection and should not be used as the sole basis for treatment or other patient management decisions. A negative result may occur with  improper specimen collection/handling, submission of specimen other than nasopharyngeal swab, presence  of viral mutation(s) within the areas targeted by this assay, and inadequate number of viral copies(<138 copies/mL). A negative result must be combined with clinical observations, patient history, and epidemiological information. The expected result is Negative.  Fact Sheet for Patients:  EntrepreneurPulse.com.au  Fact Sheet for Healthcare Providers:  IncredibleEmployment.be  This test is no t yet approved or cleared by the [place] FDA and  has been authorized for detection and/or diagnosis of SARS-CoV-2 by FDA under an Emergency Use Authorization (EUA). This EUA will remain  in effect (meaning this test can be used) for the duration of the COVID-19 declaration under Section 564(b)(1) of the Act, 21 U.S.C.section 360bbb-3(b)(1), unless the authorization is terminated  or revoked  sooner.       Influenza A by PCR NEGATIVE NEGATIVE Final   Influenza B by PCR NEGATIVE NEGATIVE Final    Comment: (NOTE) The Xpert Xpress SARS-CoV-2/FLU/RSV plus assay is intended as an aid in the diagnosis of influenza from Nasopharyngeal swab specimens and should not be used as a sole basis for treatment. Nasal washings and aspirates are unacceptable for Xpert Xpress SARS-CoV-2/FLU/RSV testing.  Fact Sheet for Patients: EntrepreneurPulse.com.au  Fact Sheet for Healthcare Providers: IncredibleEmployment.be  This test is not yet approved or cleared by the [place] FDA and has been authorized for detection and/or diagnosis of SARS-CoV-2 by FDA under an Emergency Use Authorization (EUA). This EUA will remain in effect (meaning this test can be used) for the duration of the COVID-19 declaration under Section 564(b)(1) of the Act, 21 U.S.C. section 360bbb-3(b)(1), unless the authorization is terminated or revoked.  Performed at [facility], [place] [address] [place], [place] [address]   MRSA Next Gen by PCR, Nasal      Status: None   Collection Time: 02/06/21  4:14 AM   Specimen: Nasal Mucosa; Nasal Swab  Result Value Ref Range Status   MRSA by PCR Next Gen NOT DETECTED NOT DETECTED Final    Comment: (NOTE) The GeneXpert MRSA Assay (FDA approved for NASAL specimens only), is one component of a comprehensive MRSA colonization surveillance program. It is not intended to diagnose MRSA infection nor to guide or monitor treatment for MRSA infections. Test performance is not FDA approved in patients less than 65 years old. Performed at [facility], [place] [address], [place], [place] [address]           Radiology Studies: No results found.      Scheduled Meds:  sodium chloride   Intravenous Once   sodium chloride   Intravenous Once   atorvastatin  20 mg Oral Daily   chlorhexidine  15 mL Mouth Rinse BID   escitalopram  20 mg Oral Daily   fluticasone  2 spray Each Nare Daily   furosemide  40 mg Oral Daily   guaiFENesin  1,200 mg Oral BID   ipratropium-albuterol  3 mL Nebulization BID   levothyroxine  25 mcg Oral q morning   lidocaine  1 patch Transdermal Q24H   mouth rinse  15 mL Mouth Rinse q12n4p   mometasone-formoterol  2 puff Inhalation BID   pantoprazole  40 mg Oral BID   predniSONE  60 mg Oral Q breakfast   QUEtiapine  100 mg Oral QHS   romiPLOStim  2 mcg/kg Subcutaneous Weekly   rOPINIRole  0.5 mg Oral TID   Continuous Infusions:  sodium chloride     sodium chloride     sodium chloride     sodium chloride     sodium chloride       LOS: 8 days        [name], MD [facility]   To contact the attending provider between 7A-7P or the covering provider during after hours 7P-7A, please log into the web site [email or web address] and access using universal [place] password for that web site. If you do not have the password, please call the hospital operator.  02/13/2021, 1:37 PM

## 2021-02-14 DIAGNOSIS — R571 Hypovolemic shock: Secondary | ICD-10-CM | POA: Diagnosis not present

## 2021-02-14 DIAGNOSIS — D62 Acute posthemorrhagic anemia: Secondary | ICD-10-CM | POA: Diagnosis not present

## 2021-02-14 DIAGNOSIS — D693 Immune thrombocytopenic purpura: Secondary | ICD-10-CM | POA: Diagnosis not present

## 2021-02-14 LAB — CBC WITH DIFFERENTIAL/PLATELET
Abs Immature Granulocytes: 0.12 10*3/uL — ABNORMAL HIGH (ref 0.00–0.07)
Basophils Absolute: 0 10*3/uL (ref 0.0–0.1)
Basophils Relative: 0 %
Eosinophils Absolute: 0 10*3/uL (ref 0.0–0.5)
Eosinophils Relative: 0 %
HCT: 37.1 % (ref 36.0–46.0)
Hemoglobin: 12.5 g/dL (ref 12.0–15.0)
Immature Granulocytes: 1 %
Lymphocytes Relative: 20 %
Lymphs Abs: 3.3 10*3/uL (ref 0.7–4.0)
MCH: 32.8 pg (ref 26.0–34.0)
MCHC: 33.7 g/dL (ref 30.0–36.0)
MCV: 97.4 fL (ref 80.0–100.0)
Monocytes Absolute: 1.8 10*3/uL — ABNORMAL HIGH (ref 0.1–1.0)
Monocytes Relative: 11 %
Neutro Abs: 11.6 10*3/uL — ABNORMAL HIGH (ref 1.7–7.7)
Neutrophils Relative %: 68 %
Platelets: 25 10*3/uL — CL (ref 150–400)
RBC: 3.81 MIL/uL — ABNORMAL LOW (ref 3.87–5.11)
RDW: 17.4 % — ABNORMAL HIGH (ref 11.5–15.5)
WBC: 16.9 10*3/uL — ABNORMAL HIGH (ref 4.0–10.5)
nRBC: 0 % (ref 0.0–0.2)

## 2021-02-14 NOTE — Plan of Care (Signed)

## 2021-02-14 NOTE — Progress Notes (Signed)
Subjective: The patient is seen and examined today.  She is feeling fine today with no concerning complaints.  She denied having any bleeding, bruises or ecchymosis.  She has no nausea, vomiting, diarrhea or constipation.  She has no headache or visual changes.  She is tolerating her treatment with prednisone fairly well.  Objective: Vital signs in last 24 hours: Temp:  [98.2 F (36.8 C)-98.4 F (36.9 C)] 98.4 F (36.9 C) (07/29 0428) Pulse Rate:  [67-71] 70 (07/29 0755) Resp:  [16-18] 16 (07/29 0755) BP: (85-109)/(56-69) 100/62 (07/29 0500) SpO2:  [93 %-99 %] 99 % (07/29 0755) Weight:  [168 lb 9.6 oz (76.5 kg)] 168 lb 9.6 oz (76.5 kg) (07/29 0006)  Intake/Output from previous day: 07/28 0701 - 07/29 0700 In: 360 [P.O.:360] Out: 1900 [Urine:1900] Intake/Output this shift: No intake/output data recorded.  General appearance: alert, cooperative, and no distress Resp: clear to auscultation bilaterally Cardio: regular rate and rhythm, S1, S2 normal, no murmur, click, rub or gallop GI: soft, non-tender; bowel sounds normal; no masses,  no organomegaly Extremities: extremities normal, atraumatic, no cyanosis or edema  Lab Results:  Recent Labs    02/13/21 1904 02/14/21 0227  WBC 11.9* 16.9*  HGB 13.3 12.5  HCT 39.4 37.1  PLT 36* 25*   BMET Recent Labs    02/12/21 0244  NA 133*  K 3.7  CL 98  CO2 27  GLUCOSE 96  BUN 21  CREATININE 0.77  CALCIUM 8.8*    Studies/Results: No results found.  Medications: I have reviewed the patient's current medications.    Assessment/Plan: This is a very pleasant 70 years old white female a patient of Dr. Anabel Bene at [facility] in [place] and presented with severe ITP initiated with platelets less than 5000. The patient was treated with IVIG as well as Nplate and she is currently on prednisone. Platelets count today 25000. I recommended for her to continue her current treatment with prednisone with the same dose Once  her platelets count improved to 50,000 or higher, the patient can be discharged home and start tapering the prednisone. We may consider her for another dose of Nplate tomorrow. Thank you for taking good care of Ms. [place], please call if you have any questions.   LOS: 9 days    Eilleen Kempf 02/14/2021

## 2021-02-14 NOTE — Evaluation (Signed)
Physical Therapy Evaluation Patient Details Name: Jean Kramer MRN: [id number] DOB: 11/01/1950 Today's Date: 02/14/2021   History of Present Illness  70 y/o female presents to [facility] ED on 02/05/2021 with generalized weakness, black stools and SOB. Hgb of 4, platelets less than 5000 upon admission. 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  Clinical Impression  Pt presents to PT with deficits in activity tolerance, balance, gait, functional mobility, and is limited by LE cramping pain. Pt is able to transfer and ambulate without physical assistance but demonstrates balance deviations related to sharp cramping pains in BLE. Pt reports limited mobility during this admission, PT providing encouragement to ambulate at least 3 times daily to improve neuromuscular endurance. Pt will benefit from continued acute PT services and HHPT upon discharge.    Follow Up Recommendations Home health PT    Equipment Recommendations  None recommended by PT    Recommendations for Other Services       Precautions / Restrictions Precautions Precautions: Fall Precaution Comments: monitor Hgb and platelets Restrictions Weight Bearing Restrictions: No      Mobility  Bed Mobility Overal bed mobility: Modified Independent             General bed mobility comments: HOB elevated    Transfers Overall transfer level: Needs assistance Equipment used: None Transfers: Sit to/from Stand Sit to Stand: Supervision            Ambulation/Gait Ambulation/Gait assistance: Min guard Gait Distance (Feet): 80 Feet Assistive device: None Gait Pattern/deviations: Step-to pattern Gait velocity: reduced Gait velocity interpretation: 1.31 - 2.62 ft/sec, indicative of limited community ambulator General Gait Details: pt with slowed step-to gait, multiple balance deviations due to cramping in LEs but pt is able to correct all with stepping strategy. Pt able to maintain SLS for brief  period during one cramping episode when holding RLE.  Stairs            Wheelchair Mobility    Modified Rankin (Stroke Patients Only)       Balance Overall balance assessment: Needs assistance Sitting-balance support: No upper extremity supported;Feet supported Sitting balance-Leahy Scale: Good     Standing balance support: No upper extremity supported Standing balance-Leahy Scale: Good                               Pertinent Vitals/Pain Pain Assessment: Faces Faces Pain Scale: Hurts even more Pain Location: BLE Pain Descriptors / Indicators: Cramping Pain Intervention(s): Monitored during session    Home Living Family/patient expects to be discharged to:: Private residence Living Arrangements: Alone Available Help at Discharge:  (pt reports no assistance available, does mention son assisting with building ramp though) Type of Home: House Home Access: Ramped entrance     Home Layout: One level Home Equipment: Environmental consultant - 2 wheels;Cane - single point      Prior Function Level of Independence: Independent with assistive device(s)         Comments: pt ambulates with use of cane, son drives most of the time but pt reports being able to drive     Hand Dominance        Extremity/Trunk Assessment   Upper Extremity Assessment Upper Extremity Assessment: Overall WFL for tasks assessed    Lower Extremity Assessment Lower Extremity Assessment: Generalized weakness (sharp cramping pain in BLE during ambulation)    Cervical / Trunk Assessment Cervical / Trunk Assessment: Normal  Communication  Communication: No difficulties  Cognition Arousal/Alertness: Awake/alert Behavior During Therapy: WFL for tasks assessed/performed Overall Cognitive Status: Within Functional Limits for tasks assessed                                        General Comments General comments (skin integrity, edema, etc.): VSS on RA    Exercises      Assessment/Plan    PT Assessment Patient needs continued PT services  PT Problem List Decreased activity tolerance;Decreased balance;Decreased mobility;Pain       PT Treatment Interventions DME instruction;Gait training;Functional mobility training;Therapeutic activities;Therapeutic exercise;Balance training;Patient/family education    PT Goals (Current goals can be found in the Care Plan section)  Acute Rehab PT Goals Patient Stated Goal: to get platelets up and go home PT Goal Formulation: With patient Time For Goal Achievement: 02/28/21 Potential to Achieve Goals: Good Additional Goals Additional Goal #1: Pt will score >19/24 on DGI to indicate a reduced risk for falls    Frequency Min 3X/week   Barriers to discharge        Co-evaluation               AM-PAC PT "6 Clicks" Mobility  Outcome Measure Help needed turning from your back to your side while in a flat bed without using bedrails?: None Help needed moving from lying on your back to sitting on the side of a flat bed without using bedrails?: None Help needed moving to and from a bed to a chair (including a wheelchair)?: A Little Help needed standing up from a chair using your arms (e.g., wheelchair or bedside chair)?: A Little Help needed to walk in hospital room?: A Little Help needed climbing 3-5 steps with a railing? : A Little 6 Click Score: 20    End of Session   Activity Tolerance: Patient limited by pain (LE cramping) Patient left: in chair;with call bell/phone within reach Nurse Communication: Mobility status PT Visit Diagnosis: Unsteadiness on feet (R26.81);Other abnormalities of gait and mobility (R26.89);History of falling (Z91.81)    Time: [id number] PT Time Calculation (min) (ACUTE ONLY): 12 min   Charges:   PT Evaluation $PT Eval Low Complexity: 1 Low          Zenaida Niece, PT, DPT Acute Rehabilitation Pager: [phone]   Zenaida Niece 02/14/2021, 4:51 PM

## 2021-02-14 NOTE — Progress Notes (Signed)
PROGRESS NOTE    Jean Kramer  [id number] DOB: Oct 05, 1950 DOA: 02/05/2021 PCP: [name], MD    Chief Complaint  Patient presents with   Weakness    Weakness for 3 weeks.    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 She is hypotensive on presentation with severe anemia, Hgb of 5, with platelet count of 5. S/p 1 unit of platelet transfusion, and NPlate infusion.   Assessment & Plan:   Active Problems:   Acute ITP (HCC)  GI bleed in the setting of thrombocytopenia:  Was on pressors transiently, weaned off and currently stable.  Hemorrhagic shock has resolved, off pressors on 7/22, transferred to hospitalist service on 7/23 No bleeding so far.  GI on board, recommended no EGD until platelets improve.  Hemoglobin stable.    Anemia of blood loss possibly from GI bleed.  Hemoglobin improved and stable.  Transfuse to keep hemoglobin greater than 7.  Appreciate GI recommendations.   ITP:  S/P IVIG/steroid/Nplate infusion.  Platelets improved to 25,000.  Higher dose Nplate infusion ordered by heme Onc. No bleeding evident today. No nausea, vomiting or headache or hematuria. Plan for another NPLATE infusion tomorrow.  When platelets improve to 50,000 , can be discharged home with outpatient follow up.  Heme onc on board.    Acute on chronic diastolic CHF.  Improved. Pt is on RA and denies any sob.  Strict intake and output.  Daily weights.  Monitor renal parameters.     COPD;  No wheezing on exam.  Continue to monitor.  Current smoker, smoking cessation counseling provided by provider.    Hypothyroidism:  Resume home meds.   Mild hyponatremia:  Asymptomatic.  Sodium stable around 133.     Hypotension;  Of unclear etiology.  Pt is asymptomatic.   Leukocytosis:  Probably from steroids.  Pro calcitonin is negative. She remains afebrile,  CXR does  not show obvious pneumonia. She denies any sob or cough.     Pressure injury.  Pressure Injury 02/05/21 Sacrum Left;Medial Deep Tissue Pressure Injury - Purple or maroon localized area of discolored intact skin or blood-filled blister due to damage of underlying soft tissue from pressure and/or shear. (Active)  02/05/21 2000  Location: Sacrum  Location Orientation: Left;Medial  Staging: Deep Tissue Pressure Injury - Purple or maroon localized area of discolored intact skin or blood-filled blister due to damage of underlying soft tissue from pressure and/or shear.  Wound Description (Comments):   Present on Admission: Yes  Resume wound care.      DVT prophylaxis: scd's Code Status: full code.  Family Communication: family at bedside.  Disposition:   Status is: Inpatient  Remains inpatient appropriate because:Inpatient level of care appropriate due to severity of illness  Dispo: The patient is from: Home              Anticipated d/c is to: Home              Patient currently is not medically stable to d/c.   Difficult to place patient No       Consultants:  Hematology Gi.   Procedures: none.   Antimicrobials: none.    Subjective: No chest pain or sob.   Objective: Vitals:   02/14/21 0900 02/14/21 0903 02/14/21 1032 02/14/21 1138  BP: (!) 72/47 (!) 80/50 (!) 86/51 (!) 109/95  Pulse:    75  Resp:    19  Temp:    98.2 F (36.8 C)  TempSrc:    Oral  SpO2:    91%  Weight:        Intake/Output Summary (Last 24 hours) at 02/14/2021 1252 Last data filed at 02/14/2021 [id number] Gross per 24 hour  Intake 597 ml  Output 1900 ml  Net -1303 ml    Filed Weights   02/12/21 0436 02/13/21 0018 02/14/21 0006  Weight: 78.8 kg 76.8 kg 76.5 kg    Examination:  General exam: Elderly lady not in any kind of distress Respiratory system: Air entry fair bilateral no wheezing or rhonchi Cardiovascular system: Regular rate rhythm, no JVD no pedal edema Gastrointestinal  system: Abdomen is soft nontender bowel sounds normal Central nervous system: Alert and oriented, grossly nonfocal Extremities: No pedal edema Skin: No ulcers seen Psychiatry: Mood is appropriate    Data Reviewed: I have personally reviewed following labs and imaging studies  CBC: Recent Labs  Lab 02/11/21 1838 02/12/21 0244 02/13/21 0909 02/13/21 1904 02/14/21 0227  WBC 13.2* 16.0* 16.9* 11.9* 16.9*  NEUTROABS  --   --  9.8* 10.4* 11.6*  HGB 12.0 11.9* 12.3 13.3 12.5  HCT 35.3* 35.4* 36.5 39.4 37.1  MCV 97.8 97.5 96.8 97.8 97.4  PLT 21* 15* 18* 36* 25*     Basic Metabolic Panel: Recent Labs  Lab 02/08/21 0310 02/10/21 0657 02/12/21 0244  NA 134* 133* 133*  K 4.6 3.8 3.7  CL 108 99 98  CO2 '22 27 27  '$ GLUCOSE 107* 72 96  BUN '22 22 21  '$ CREATININE 0.79 0.77 0.77  CALCIUM 8.2* 8.3* 8.8*  MG  --  2.1  --      GFR: CrCl cannot be calculated (Unknown ideal weight.).  Liver Function Tests: No results for input(s): AST, ALT, ALKPHOS, BILITOT, PROT, ALBUMIN in the last 168 hours.   CBG: No results for input(s): GLUCAP in the last 168 hours.    Recent Results (from the past 240 hour(s))  Resp Panel by RT-PCR (Flu A&B, Covid) Nasopharyngeal Swab     Status: None   Collection Time: 02/05/21  1:40 PM   Specimen: Nasopharyngeal Swab; Nasopharyngeal(NP) swabs in vial transport medium  Result Value Ref Range Status   SARS Coronavirus 2 by RT PCR NEGATIVE NEGATIVE Final    Comment: (NOTE) SARS-CoV-2 target nucleic acids are NOT DETECTED.  The SARS-CoV-2 RNA is generally detectable in upper respiratory specimens during the acute phase of infection. The lowest concentration of SARS-CoV-2 viral copies this assay can detect is 138 copies/mL. A negative result does not preclude SARS-Cov-2 infection and should not be used as the sole basis for treatment or other patient management decisions. A negative result may occur with  improper specimen collection/handling,  submission of specimen other than nasopharyngeal swab, presence of viral mutation(s) within the areas targeted by this assay, and inadequate number of viral copies(<138 copies/mL). A negative result must be combined with clinical observations, patient history, and epidemiological information. The expected result is Negative.  Fact Sheet for Patients:  EntrepreneurPulse.com.au  Fact Sheet for Healthcare Providers:  IncredibleEmployment.be  This test is no t yet approved or cleared by the [place] FDA and  has been authorized for detection and/or diagnosis of SARS-CoV-2 by FDA under an Emergency Use Authorization (EUA). This EUA will remain  in effect (meaning this test can be used) for the duration of the COVID-19 declaration under Section 564(b)(1) of the Act, 21 U.S.C.section 360bbb-3(b)(1), unless  the authorization is terminated  or revoked sooner.       Influenza A by PCR NEGATIVE NEGATIVE Final   Influenza B by PCR NEGATIVE NEGATIVE Final    Comment: (NOTE) The Xpert Xpress SARS-CoV-2/FLU/RSV plus assay is intended as an aid in the diagnosis of influenza from Nasopharyngeal swab specimens and should not be used as a sole basis for treatment. Nasal washings and aspirates are unacceptable for Xpert Xpress SARS-CoV-2/FLU/RSV testing.  Fact Sheet for Patients: EntrepreneurPulse.com.au  Fact Sheet for Healthcare Providers: IncredibleEmployment.be  This test is not yet approved or cleared by the [place] FDA and has been authorized for detection and/or diagnosis of SARS-CoV-2 by FDA under an Emergency Use Authorization (EUA). This EUA will remain in effect (meaning this test can be used) for the duration of the COVID-19 declaration under Section 564(b)(1) of the Act, 21 U.S.C. section 360bbb-3(b)(1), unless the authorization is terminated or revoked.  Performed at [facility], [place] [address], [place], [place] [address]   MRSA Next Gen by PCR, Nasal     Status: None   Collection Time: 02/06/21  4:14 AM   Specimen: Nasal Mucosa; Nasal Swab  Result Value Ref Range Status   MRSA by PCR Next Gen NOT DETECTED NOT DETECTED Final    Comment: (NOTE) The GeneXpert MRSA Assay (FDA approved for NASAL specimens only), is one component of a comprehensive MRSA colonization surveillance program. It is not intended to diagnose MRSA infection nor to guide or monitor treatment for MRSA infections. Test performance is not FDA approved in patients less than 57 years old. Performed at [facility], [place] [address] [place], [place] [address]           Radiology Studies: No results found.      Scheduled Meds:  sodium chloride   Intravenous Once   sodium chloride   Intravenous Once   atorvastatin  20 mg Oral Daily   chlorhexidine  15 mL Mouth Rinse BID   escitalopram  20 mg Oral Daily   fluticasone  2 spray Each Nare Daily   furosemide  40 mg Oral Daily   guaiFENesin  1,200 mg Oral BID   ipratropium-albuterol  3 mL Nebulization BID   levothyroxine  25 mcg Oral q morning   lidocaine  1 patch Transdermal Q24H   mouth rinse  15 mL Mouth Rinse q12n4p   mometasone-formoterol  2 puff Inhalation BID   pantoprazole  40 mg Oral BID   predniSONE  60 mg Oral Q breakfast   QUEtiapine  100 mg Oral QHS   romiPLOStim  2 mcg/kg Subcutaneous Weekly   rOPINIRole  0.5 mg Oral TID   Continuous Infusions:  sodium chloride     sodium chloride     sodium chloride     sodium chloride     sodium chloride       LOS: 9 days        [name], MD [facility]   To contact the attending provider between 7A-7P or the covering provider during after hours 7P-7A, please log into the web site [email or web address] and access using universal [place] password for that web site. If you do not have the password, please call the hospital operator.  02/14/2021, 12:52 PM

## 2021-02-15 ENCOUNTER — Inpatient Hospital Stay (HOSPITAL_COMMUNITY): Payer: Medicare Other

## 2021-02-15 DIAGNOSIS — D693 Immune thrombocytopenic purpura: Secondary | ICD-10-CM | POA: Diagnosis not present

## 2021-02-15 DIAGNOSIS — D62 Acute posthemorrhagic anemia: Secondary | ICD-10-CM | POA: Diagnosis not present

## 2021-02-15 DIAGNOSIS — R571 Hypovolemic shock: Secondary | ICD-10-CM | POA: Diagnosis not present

## 2021-02-15 LAB — CBC WITH DIFFERENTIAL/PLATELET
Abs Immature Granulocytes: 0.15 10*3/uL — ABNORMAL HIGH (ref 0.00–0.07)
Basophils Absolute: 0 10*3/uL (ref 0.0–0.1)
Basophils Relative: 0 %
Eosinophils Absolute: 0 10*3/uL (ref 0.0–0.5)
Eosinophils Relative: 0 %
HCT: 38.4 % (ref 36.0–46.0)
Hemoglobin: 13.3 g/dL (ref 12.0–15.0)
Immature Granulocytes: 1 %
Lymphocytes Relative: 16 %
Lymphs Abs: 3.4 10*3/uL (ref 0.7–4.0)
MCH: 33.3 pg (ref 26.0–34.0)
MCHC: 34.6 g/dL (ref 30.0–36.0)
MCV: 96.2 fL (ref 80.0–100.0)
Monocytes Absolute: 2.5 10*3/uL — ABNORMAL HIGH (ref 0.1–1.0)
Monocytes Relative: 12 %
Neutro Abs: 15.5 10*3/uL — ABNORMAL HIGH (ref 1.7–7.7)
Neutrophils Relative %: 71 %
Platelets: 26 10*3/uL — CL (ref 150–400)
RBC: 3.99 MIL/uL (ref 3.87–5.11)
RDW: 17.5 % — ABNORMAL HIGH (ref 11.5–15.5)
WBC: 21.6 10*3/uL — ABNORMAL HIGH (ref 4.0–10.5)
nRBC: 0 % (ref 0.0–0.2)

## 2021-02-15 LAB — URINALYSIS, ROUTINE W REFLEX MICROSCOPIC
Bacteria, UA: NONE SEEN
Bilirubin Urine: NEGATIVE
Glucose, UA: NEGATIVE mg/dL
Hgb urine dipstick: NEGATIVE
Ketones, ur: NEGATIVE mg/dL
Nitrite: NEGATIVE
Protein, ur: NEGATIVE mg/dL
Specific Gravity, Urine: 1.028 (ref 1.005–1.030)
pH: 5 (ref 5.0–8.0)

## 2021-02-15 LAB — PROCALCITONIN: Procalcitonin: <0.1 ng/mL

## 2021-02-15 MED ORDER — ROMIPLOSTIM INJECTION 500 MCG
10.0000 ug/kg | SUBCUTANEOUS | Status: DC
  Administered 2021-02-15: 850 ug via SUBCUTANEOUS
  Filled 2021-02-15: qty 1.7

## 2021-02-15 NOTE — Progress Notes (Signed)
PROGRESS NOTE    Jean Kramer  [id number] DOB: Jul 10, 1951 DOA: 02/05/2021 PCP: [name], MD    Chief Complaint  Patient presents with   Weakness    Weakness for 3 weeks.    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 She is hypotensive on presentation with severe anemia, Hgb of 5, with platelet count of 5. S/p 1 unit of platelet transfusion, and NPlate infusion.   Assessment & Plan:   Active Problems:   Acute ITP (HCC)  GI bleed in the setting of thrombocytopenia:  Was on pressors transiently, weaned off and currently stable.  Hemorrhagic shock has resolved, off pressors on 7/22, transferred to hospitalist service on 7/23 No bleeding so far.  GI on board, recommended no EGD until platelets improve.  Hemoglobin stable   Anemia of blood loss possibly from GI bleed.  Hemoglobin improved and stable.  Transfuse to keep hemoglobin greater than 7.  Appreciate GI recommendations.   ITP:  S/P IVIG/steroid/Nplate infusion.  Platelets improved to 26,000 Higher dose Nplate infusion ordered by heme Onc. No bleeding evident today.  No nausea vomiting abdominal pain, hematuria or headache.  Patient is scheduled to get Nplate infusion 10 MCG per KG body weight as her dose today. When platelets improve to 50,000 , can be discharged home with outpatient follow up with tapering steroids.  Heme onc on board.    Acute on chronic diastolic CHF.  Improved. Pt is on RA and denies any sob.  Strict intake and output.  Daily weights.  Monitor renal parameters.     COPD;  No wheezing heard Continue to monitor.  Current smoker, smoking cessation counseling provided by provider.    Hypothyroidism:  Resume home meds.   Mild hyponatremia:  Asymptomatic.  Sodium stable around 133.     Hypotension;  Patient remains asymptomatic, no dizziness or weakness. Check for UA, chest  x-ray for any sources of infection. Get procalcitonin level. Get a.m. cortisol level.  Patient is currently off all blood pressure medications and Lasix.  Leukocytosis:  Probably from steroids  and Nplate injection CXR does not show obvious pneumonia. She denies any sob or cough.     Pressure injury.  Pressure Injury 02/05/21 Sacrum Left;Medial Deep Tissue Pressure Injury - Purple or maroon localized area of discolored intact skin or blood-filled blister due to damage of underlying soft tissue from pressure and/or shear. (Active)  02/05/21 2000  Location: Sacrum  Location Orientation: Left;Medial  Staging: Deep Tissue Pressure Injury - Purple or maroon localized area of discolored intact skin or blood-filled blister due to damage of underlying soft tissue from pressure and/or shear.  Wound Description (Comments):   Present on Admission: Yes  Resume wound care.      DVT prophylaxis: scd's Code Status: full code.  Family Communication: None at bedside Disposition:   Status is: Inpatient  Remains inpatient appropriate because:Inpatient level of care appropriate due to severity of illness  Dispo: The patient is from: Home              Anticipated d/c is to: Home              Patient currently is not medically stable to d/c.   Difficult to place patient No       Consultants:  Hematology Gi.   Procedures: none.   Antimicrobials: none.    Subjective: No dizziness, chest  pain or shortness of breath. Therapy evaluation recommending home health PT.  Objective: Vitals:   02/15/21 0813 02/15/21 0842 02/15/21 0846 02/15/21 1054  BP:  (!) 81/47 (!) 86/50 (!) 95/59  Pulse:  75  66  Resp:  16  18  Temp:  (!) 97.4 F (36.3 C)  97.8 F (36.6 C)  TempSrc:  Oral  Oral  SpO2: 97% 97%  96%  Weight:        Intake/Output Summary (Last 24 hours) at 02/15/2021 1250 Last data filed at 02/15/2021 1231 Gross per 24 hour  Intake 1429 ml  Output 900 ml  Net 529 ml    Filed  Weights   02/13/21 0018 02/14/21 0006 02/15/21 0442  Weight: 76.8 kg 76.5 kg 76.9 kg    Examination:  General exam: Elderly woman, ill-appearing, not in any kind of distress Respiratory system: Clear to auscultation, no wheezing or rhonchi on room air Cardiovascular system: Regular rate rhythm, no JVD no pedal edema Gastrointestinal system: Abdomen is nontender, soft nondistended bowel sounds normal Central nervous system: Alert and oriented, grossly nonfocal Extremities: No leg edema Skin: Sacral deep tissue injury found Psychiatry: Mood is appropriate    Data Reviewed: I have personally reviewed following labs and imaging studies  CBC: Recent Labs  Lab 02/12/21 0244 02/13/21 0909 02/13/21 1904 02/14/21 0227 02/15/21 0236  WBC 16.0* 16.9* 11.9* 16.9* 21.6*  NEUTROABS  --  9.8* 10.4* 11.6* 15.5*  HGB 11.9* 12.3 13.3 12.5 13.3  HCT 35.4* 36.5 39.4 37.1 38.4  MCV 97.5 96.8 97.8 97.4 96.2  PLT 15* 18* 36* 25* 26*     Basic Metabolic Panel: Recent Labs  Lab 02/10/21 0657 02/12/21 0244  NA 133* 133*  K 3.8 3.7  CL 99 98  CO2 27 27  GLUCOSE 72 96  BUN 22 21  CREATININE 0.77 0.77  CALCIUM 8.3* 8.8*  MG 2.1  --      GFR: CrCl cannot be calculated (Unknown ideal weight.).  Liver Function Tests: No results for input(s): AST, ALT, ALKPHOS, BILITOT, PROT, ALBUMIN in the last 168 hours.   CBG: No results for input(s): GLUCAP in the last 168 hours.    Recent Results (from the past 240 hour(s))  Resp Panel by RT-PCR (Flu A&B, Covid) Nasopharyngeal Swab     Status: None   Collection Time: 02/05/21  1:40 PM   Specimen: Nasopharyngeal Swab; Nasopharyngeal(NP) swabs in vial transport medium  Result Value Ref Range Status   SARS Coronavirus 2 by RT PCR NEGATIVE NEGATIVE Final    Comment: (NOTE) SARS-CoV-2 target nucleic acids are NOT DETECTED.  The SARS-CoV-2 RNA is generally detectable in upper respiratory specimens during the acute phase of infection. The  lowest concentration of SARS-CoV-2 viral copies this assay can detect is 138 copies/mL. A negative result does not preclude SARS-Cov-2 infection and should not be used as the sole basis for treatment or other patient management decisions. A negative result may occur with  improper specimen collection/handling, submission of specimen other than nasopharyngeal swab, presence of viral mutation(s) within the areas targeted by this assay, and inadequate number of viral copies(<138 copies/mL). A negative result must be combined with clinical observations, patient history, and epidemiological information. The expected result is Negative.  Fact Sheet for Patients:  EntrepreneurPulse.com.au  Fact Sheet for Healthcare Providers:  IncredibleEmployment.be  This test is no t yet approved or cleared by the [place] FDA and  has been authorized for detection and/or diagnosis of SARS-CoV-2 by FDA under  an Emergency Use Authorization (EUA). This EUA will remain  in effect (meaning this test can be used) for the duration of the COVID-19 declaration under Section 564(b)(1) of the Act, 21 U.S.C.section 360bbb-3(b)(1), unless the authorization is terminated  or revoked sooner.       Influenza A by PCR NEGATIVE NEGATIVE Final   Influenza B by PCR NEGATIVE NEGATIVE Final    Comment: (NOTE) The Xpert Xpress SARS-CoV-2/FLU/RSV plus assay is intended as an aid in the diagnosis of influenza from Nasopharyngeal swab specimens and should not be used as a sole basis for treatment. Nasal washings and aspirates are unacceptable for Xpert Xpress SARS-CoV-2/FLU/RSV testing.  Fact Sheet for Patients: EntrepreneurPulse.com.au  Fact Sheet for Healthcare Providers: IncredibleEmployment.be  This test is not yet approved or cleared by the [place] FDA and has been authorized for detection and/or diagnosis of SARS-CoV-2 by FDA under  an Emergency Use Authorization (EUA). This EUA will remain in effect (meaning this test can be used) for the duration of the COVID-19 declaration under Section 564(b)(1) of the Act, 21 U.S.C. section 360bbb-3(b)(1), unless the authorization is terminated or revoked.  Performed at [facility], [place] [address] [place], [place] [address]   MRSA Next Gen by PCR, Nasal     Status: None   Collection Time: 02/06/21  4:14 AM   Specimen: Nasal Mucosa; Nasal Swab  Result Value Ref Range Status   MRSA by PCR Next Gen NOT DETECTED NOT DETECTED Final    Comment: (NOTE) The GeneXpert MRSA Assay (FDA approved for NASAL specimens only), is one component of a comprehensive MRSA colonization surveillance program. It is not intended to diagnose MRSA infection nor to guide or monitor treatment for MRSA infections. Test performance is not FDA approved in patients less than 47 years old. Performed at [facility], [place] [address] [place], [place] [address]           Radiology Studies: No results found.      Scheduled Meds:  sodium chloride   Intravenous Once   sodium chloride   Intravenous Once   atorvastatin  20 mg Oral Daily   chlorhexidine  15 mL Mouth Rinse BID   escitalopram  20 mg Oral Daily   fluticasone  2 spray Each Nare Daily   furosemide  40 mg Oral Daily   guaiFENesin  1,200 mg Oral BID   ipratropium-albuterol  3 mL Nebulization BID   levothyroxine  25 mcg Oral q morning   lidocaine  1 patch Transdermal Q24H   mouth rinse  15 mL Mouth Rinse q12n4p   mometasone-formoterol  2 puff Inhalation BID   pantoprazole  40 mg Oral BID   predniSONE  60 mg Oral Q breakfast   QUEtiapine  100 mg Oral QHS   romiPLOStim  10 mcg/kg Subcutaneous Weekly   rOPINIRole  0.5 mg Oral TID   Continuous Infusions:  sodium chloride     sodium chloride     sodium chloride     sodium chloride     sodium chloride       LOS: 10 days        [name], MD [facility]   To contact the attending provider between 7A-7P or the covering provider during after hours 7P-7A, please log into the web site [email or web address] and access using universal [place] password for that web site. If you do not have the password, please call the hospital operator.  02/15/2021, 12:50 PM

## 2021-02-15 NOTE — Progress Notes (Signed)
0840am- Pts SBP 80's, ( see flowsheet) pt. asymptomatic. MD made aware. Will monitor accordingly.

## 2021-02-15 NOTE — Progress Notes (Signed)
Chart Note I reviewed the CBC from today and her platelets count showed no significant improvement. We will change Nplate to 10 mcg/kg for her dose today. Will continue to monitor her platelet count on daily basis and once it is improved over 50,000, will consider discharge home with start tapering of the prednisone dose.  The patient will follow-up with Dr. Anabel Bene after discharge.

## 2021-02-16 DIAGNOSIS — D62 Acute posthemorrhagic anemia: Secondary | ICD-10-CM | POA: Diagnosis not present

## 2021-02-16 DIAGNOSIS — D693 Immune thrombocytopenic purpura: Secondary | ICD-10-CM | POA: Diagnosis not present

## 2021-02-16 DIAGNOSIS — R571 Hypovolemic shock: Secondary | ICD-10-CM | POA: Diagnosis not present

## 2021-02-16 LAB — CBC WITH DIFFERENTIAL/PLATELET
Abs Immature Granulocytes: 0.2 10*3/uL — ABNORMAL HIGH (ref 0.00–0.07)
Basophils Absolute: 0 10*3/uL (ref 0.0–0.1)
Basophils Relative: 0 %
Eosinophils Absolute: 0.1 10*3/uL (ref 0.0–0.5)
Eosinophils Relative: 0 %
HCT: 36.9 % (ref 36.0–46.0)
Hemoglobin: 12.3 g/dL (ref 12.0–15.0)
Immature Granulocytes: 1 %
Lymphocytes Relative: 21 %
Lymphs Abs: 4.7 10*3/uL — ABNORMAL HIGH (ref 0.7–4.0)
MCH: 32.7 pg (ref 26.0–34.0)
MCHC: 33.3 g/dL (ref 30.0–36.0)
MCV: 98.1 fL (ref 80.0–100.0)
Monocytes Absolute: 2.2 10*3/uL — ABNORMAL HIGH (ref 0.1–1.0)
Monocytes Relative: 10 %
Neutro Abs: 15.7 10*3/uL — ABNORMAL HIGH (ref 1.7–7.7)
Neutrophils Relative %: 68 %
Platelets: 17 10*3/uL — CL (ref 150–400)
RBC: 3.76 MIL/uL — ABNORMAL LOW (ref 3.87–5.11)
RDW: 17.4 % — ABNORMAL HIGH (ref 11.5–15.5)
WBC: 22.8 10*3/uL — ABNORMAL HIGH (ref 4.0–10.5)
nRBC: 0 % (ref 0.0–0.2)

## 2021-02-16 LAB — URINE CULTURE
Culture: <10000 — AB
Special Requests: NORMAL

## 2021-02-16 LAB — BASIC METABOLIC PANEL
Anion gap: 6 (ref 5–15)
BUN: 19 mg/dL (ref 8–23)
CO2: 24 mmol/L (ref 22–32)
Calcium: 8.8 mg/dL — ABNORMAL LOW (ref 8.9–10.3)
Chloride: 101 mmol/L (ref 98–111)
Creatinine, Ser: 0.85 mg/dL (ref 0.44–1.00)
GFR, Estimated: >60 mL/min (ref >60)
Glucose, Bld: 106 mg/dL — ABNORMAL HIGH (ref 70–99)
Potassium: 3.6 mmol/L (ref 3.5–5.1)
Sodium: 131 mmol/L — ABNORMAL LOW (ref 135–145)

## 2021-02-16 LAB — CORTISOL: Cortisol, Plasma: 3 ug/dL

## 2021-02-16 MED ORDER — SODIUM CHLORIDE 0.9 % IV BOLUS
1000.0000 mL | Freq: Once | INTRAVENOUS | Status: AC
  Administered 2021-02-16: 1000 mL via INTRAVENOUS

## 2021-02-16 NOTE — Plan of Care (Signed)

## 2021-02-16 NOTE — Progress Notes (Signed)
PROGRESS NOTE    Jean Kramer  [id number] DOB: 1951/01/23 DOA: 02/05/2021 PCP: [name], MD    Chief Complaint  Patient presents with   Weakness    Weakness for 3 weeks.    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 She is hypotensive on presentation with severe anemia, Hgb of 5, with platelet count of 5. S/p 1 unit of platelet transfusion, and NPlate infusion.   Assessment & Plan:   Active Problems:   Acute ITP (HCC)  GI bleed in the setting of thrombocytopenia:  Was on pressors transiently, weaned off and currently stable.  Hemorrhagic shock has resolved, off pressors on 7/22, transferred to hospitalist service on 7/23 No bleeding so far.  GI on board, recommended no EGD until platelets improve.  Hemoglobin stable   Anemia of blood loss possibly from GI bleed.  Hemoglobin improved and stable.  Transfuse to keep hemoglobin greater than 7.  Appreciate GI recommendations.   ITP:  S/P IVIG/steroid/Nplate infusion.  Platelets dropped to 17,000.  Higher dose Nplate infusion ordered by heme Onc. No bleeding evident today.  No nausea vomiting abdominal pain, hematuria or headache.  Further recommendations as per oncology.  When platelets improve to 50,000 , can be discharged home with outpatient follow up with tapering steroids.  Heme onc on board.    Acute on chronic diastolic CHF.  Improved. Pt is on RA and denies any sob.  Strict intake and output.  Daily weights.  Renal parameters are stable.     COPD;  No wheezing heard, on RA.  Continue to monitor.  Current smoker, smoking cessation counseling provided by provider.    Hypothyroidism:  Resume home meds.   Mild hyponatremia:  Asymptomatic.     Hypotension;  Patient remains asymptomatic, no dizziness or weakness. CXR is negative for pneumonia and UA is negative for UTI.  Negative   procalcitonin level. Low cortisol level.  Pt is on 60 mg of prednisone.  Patient is currently off all blood pressure medications and Lasix.  Leukocytosis:  Probably from steroids  and Nplate injection CXR does not show obvious pneumonia. She denies any sob or cough.     Pressure injury.  Pressure Injury 02/05/21 Sacrum Left;Medial Deep Tissue Pressure Injury - Purple or maroon localized area of discolored intact skin or blood-filled blister due to damage of underlying soft tissue from pressure and/or shear. (Active)  02/05/21 2000  Location: Sacrum  Location Orientation: Left;Medial  Staging: Deep Tissue Pressure Injury - Purple or maroon localized area of discolored intact skin or blood-filled blister due to damage of underlying soft tissue from pressure and/or shear.  Wound Description (Comments):   Present on Admission: Yes  Resume wound care.      DVT prophylaxis: scd's Code Status: full code.  Family Communication: None at bedside Disposition:   Status is: Inpatient  Remains inpatient appropriate because:Inpatient level of care appropriate due to severity of illness  Dispo: The patient is from: Home              Anticipated d/c is to: Home              Patient currently is not medically stable to d/c.   Difficult to place patient No       Consultants:  Hematology Gi.   Procedures: none.   Antimicrobials: none.    Subjective: No chest pain or  sob, just disappointed with low platelet count.   Objective: Vitals:   02/15/21 1946 02/15/21 2026 02/16/21 0402 02/16/21 0736  BP:  100/60 (!) 96/58   Pulse:  78 71   Resp:  18 18   Temp:  98.1 F (36.7 C) (!) 97.5 F (36.4 C)   TempSrc:  Oral Oral   SpO2: 100% 96% 97% 95%  Weight:   77.4 kg     Intake/Output Summary (Last 24 hours) at 02/16/2021 1008 Last data filed at 02/16/2021 [id number] Gross per 24 hour  Intake 1035 ml  Output 550 ml  Net 485 ml    Filed Weights   02/14/21 0006 02/15/21 0442 02/16/21  0402  Weight: 76.5 kg 76.9 kg 77.4 kg    Examination:  General exam: Elderly woman not in any kind of distress Respiratory system: Air entry fair bilateral no wheezing or rhonchi, Cardiovascular system: Regular rate rhythm, no JVD or pedal edema Gastrointestinal system: Abdomen is nontender nondistended bowel sounds normal Central nervous system: Alert and oriented, grossly nonfocal Extremities: No pedal edema Skin: Sacral deep tissue injury found Psychiatry: Mood is appropriate    Data Reviewed: I have personally reviewed following labs and imaging studies  CBC: Recent Labs  Lab 02/13/21 0909 02/13/21 1904 02/14/21 0227 02/15/21 0236 02/16/21 0315  WBC 16.9* 11.9* 16.9* 21.6* 22.8*  NEUTROABS 9.8* 10.4* 11.6* 15.5* 15.7*  HGB 12.3 13.3 12.5 13.3 12.3  HCT 36.5 39.4 37.1 38.4 36.9  MCV 96.8 97.8 97.4 96.2 98.1  PLT 18* 36* 25* 26* 17*     Basic Metabolic Panel: Recent Labs  Lab 02/10/21 0657 02/12/21 0244 02/16/21 0315  NA 133* 133* 131*  K 3.8 3.7 3.6  CL 99 98 101  CO2 '27 27 24  '$ GLUCOSE 72 96 106*  BUN '22 21 19  '$ CREATININE 0.77 0.77 0.85  CALCIUM 8.3* 8.8* 8.8*  MG 2.1  --   --      GFR: CrCl cannot be calculated (Unknown ideal weight.).  Liver Function Tests: No results for input(s): AST, ALT, ALKPHOS, BILITOT, PROT, ALBUMIN in the last 168 hours.   CBG: No results for input(s): GLUCAP in the last 168 hours.    No results found for this or any previous visit (from the past 240 hour(s)).         Radiology Studies: DG CHEST PORT 1 VIEW  Result Date: 02/15/2021 CLINICAL DATA:  Weakness. EXAM: PORTABLE CHEST 1 VIEW COMPARISON:  Chest radiograph 02/09/2021. FINDINGS: Patient is mildly rotated. Normal cardiac and mediastinal contours. No large area of pulmonary consolidation. No pleural effusion or pneumothorax. IMPRESSION: No acute cardiopulmonary process. Electronically Signed   By: Lovey Newcomer M.D.   On: 02/15/2021 15:38         Scheduled Meds:  sodium chloride   Intravenous Once   sodium chloride   Intravenous Once   atorvastatin  20 mg Oral Daily   chlorhexidine  15 mL Mouth Rinse BID   escitalopram  20 mg Oral Daily   fluticasone  2 spray Each Nare Daily   furosemide  40 mg Oral Daily   guaiFENesin  1,200 mg Oral BID   ipratropium-albuterol  3 mL Nebulization BID   levothyroxine  25 mcg Oral q morning   lidocaine  1 patch Transdermal Q24H   mouth rinse  15 mL Mouth Rinse q12n4p   mometasone-formoterol  2 puff Inhalation BID   pantoprazole  40 mg Oral BID   predniSONE  60 mg Oral Q breakfast  QUEtiapine  100 mg Oral QHS   romiPLOStim  10 mcg/kg Subcutaneous Weekly   rOPINIRole  0.5 mg Oral TID   Continuous Infusions:  sodium chloride     sodium chloride     sodium chloride     sodium chloride     sodium chloride       LOS: 11 days        [name], MD [facility]   To contact the attending provider between 7A-7P or the covering provider during after hours 7P-7A, please log into the web site [email or web address] and access using universal [place] password for that web site. If you do not have the password, please call the hospital operator.  02/16/2021, 10:08 AM

## 2021-02-16 NOTE — Progress Notes (Signed)
Subjective: The patient is seen and examined today.  She is feeling fine today with no concerning complaints except for being upset about her platelets count today.  She denied having any nausea, vomiting, diarrhea or constipation.  She has no bleeding, bruises or ecchymosis.  She received injection of Nplate 10 mcg/KG yesterday.  She has no adverse effect from the injection.  Objective: Vital signs in last 24 hours: Temp:  [97.5 F (36.4 C)-98.1 F (36.7 C)] 97.5 F (36.4 C) (07/31 0402) Pulse Rate:  [66-78] 71 (07/31 0402) Resp:  [14-18] 18 (07/31 0402) BP: (95-100)/(58-60) 96/58 (07/31 0402) SpO2:  [95 %-100 %] 95 % (07/31 0736) FiO2 (%):  [21 %] 21 % (07/31 0736) Weight:  [170 lb 11.2 oz (77.4 kg)] 170 lb 11.2 oz (77.4 kg) (07/31 0402)  Intake/Output from previous day: 07/30 0701 - 07/31 0700 In: 792 [P.O.:792] Out: 550 [Urine:550] Intake/Output this shift: Total I/O In: 480 [P.O.:480] Out: -   General appearance: alert, cooperative, and no distress Resp: clear to auscultation bilaterally and normal percussion bilaterally Cardio: regular rate and rhythm, S1, S2 normal, no murmur, click, rub or gallop GI: soft, non-tender; bowel sounds normal; no masses,  no organomegaly Extremities: extremities normal, atraumatic, no cyanosis or edema  Lab Results:  Recent Labs    02/15/21 0236 02/16/21 0315  WBC 21.6* 22.8*  HGB 13.3 12.3  HCT 38.4 36.9  PLT 26* 17*   BMET Recent Labs    02/16/21 0315  NA 131*  K 3.6  CL 101  CO2 24  GLUCOSE 106*  BUN 19  CREATININE 0.85  CALCIUM 8.8*    Studies/Results: DG CHEST PORT 1 VIEW  Result Date: 02/15/2021 CLINICAL DATA:  Weakness. EXAM: PORTABLE CHEST 1 VIEW COMPARISON:  Chest radiograph 02/09/2021. FINDINGS: Patient is mildly rotated. Normal cardiac and mediastinal contours. No large area of pulmonary consolidation. No pleural effusion or pneumothorax. IMPRESSION: No acute cardiopulmonary process. Electronically Signed   By:  Lovey Newcomer M.D.   On: 02/15/2021 15:38    Medications: I have reviewed the patient's current medications.   Assessment/Plan: This is a very pleasant 70 years old white female with a history of ITP treated with IVIG and currently on treatment with prednisone.  The patient is also treated with weekly Nplate and the last dose was given yesterday at 10 mcg/KG and tolerated the dose well. Repeat CBC today unfortunately showed decline in her platelets count but this could be temporary before improvement on the Nplate and prednisone. If no improvement of her platelet count in the next few days, may consider The patient for treatment with Rituxan or repeat IVIG. Once the platelet count is 50,000 or higher, the patient can be discharged home on a tapered dose of prednisone and follow-up with Dr. Hinton Rao. Thank you for the good care you are providing for Jean Kramer.  We will continue to follow-up with you and assist in his management.  LOS: 11 days    Jean Kramer 02/16/2021

## 2021-02-17 DIAGNOSIS — D62 Acute posthemorrhagic anemia: Secondary | ICD-10-CM | POA: Diagnosis not present

## 2021-02-17 DIAGNOSIS — R571 Hypovolemic shock: Secondary | ICD-10-CM | POA: Diagnosis not present

## 2021-02-17 DIAGNOSIS — D693 Immune thrombocytopenic purpura: Secondary | ICD-10-CM | POA: Diagnosis not present

## 2021-02-17 LAB — CBC WITH DIFFERENTIAL/PLATELET
Abs Immature Granulocytes: 0.18 10*3/uL — ABNORMAL HIGH (ref 0.00–0.07)
Basophils Absolute: 0.1 10*3/uL (ref 0.0–0.1)
Basophils Relative: 0 %
Eosinophils Absolute: 0 10*3/uL (ref 0.0–0.5)
Eosinophils Relative: 0 %
HCT: 35.5 % — ABNORMAL LOW (ref 36.0–46.0)
Hemoglobin: 11.8 g/dL — ABNORMAL LOW (ref 12.0–15.0)
Immature Granulocytes: 1 %
Lymphocytes Relative: 16 %
Lymphs Abs: 3.7 10*3/uL (ref 0.7–4.0)
MCH: 32.3 pg (ref 26.0–34.0)
MCHC: 33.2 g/dL (ref 30.0–36.0)
MCV: 97.3 fL (ref 80.0–100.0)
Monocytes Absolute: 2.2 10*3/uL — ABNORMAL HIGH (ref 0.1–1.0)
Monocytes Relative: 10 %
Neutro Abs: 16.4 10*3/uL — ABNORMAL HIGH (ref 1.7–7.7)
Neutrophils Relative %: 73 %
Platelets: 15 10*3/uL — CL (ref 150–400)
RBC: 3.65 MIL/uL — ABNORMAL LOW (ref 3.87–5.11)
RDW: 17.1 % — ABNORMAL HIGH (ref 11.5–15.5)
WBC: 22.5 10*3/uL — ABNORMAL HIGH (ref 4.0–10.5)
nRBC: 0 % (ref 0.0–0.2)

## 2021-02-17 MED ORDER — IPRATROPIUM-ALBUTEROL 0.5-2.5 (3) MG/3ML IN SOLN: 3.0000 mL | Freq: Two times a day (BID) | RESPIRATORY_TRACT | Status: DC | PRN

## 2021-02-17 MED ORDER — MIDODRINE HCL 5 MG PO TABS
5.0000 mg | ORAL_TABLET | Freq: Three times a day (TID) | ORAL | Status: DC
  Administered 2021-02-17 – 2021-02-19 (×6): 5 mg via ORAL
  Filled 2021-02-17 (×6): qty 1

## 2021-02-17 MED ORDER — LACTATED RINGERS IV BOLUS
500.0000 mL | Freq: Once | INTRAVENOUS | Status: AC
  Administered 2021-02-17: 500 mL via INTRAVENOUS

## 2021-02-17 NOTE — Progress Notes (Signed)
Subjective: The patient is seen and examined today.  She is feeling fine with no concerning complaints.  She denied having any bleeding, bruises or ecchymosis.  She has no nausea, vomiting, diarrhea or constipation.  She still very anxious about her platelets count and want to go home as soon as her platelet count recovered.  Objective: Vital signs in last 24 hours: Temp:  [98 F (36.7 C)-98.1 F (36.7 C)] 98.1 F (36.7 C) (08/01 0500) Pulse Rate:  [71-81] 71 (08/01 0500) Resp:  [15-18] 17 (08/01 0500) BP: (84-107)/(46-62) 96/57 (08/01 0500) SpO2:  [97 %-98 %] 97 % (08/01 0500) Weight:  [170 lb 11.2 oz (77.4 kg)] 170 lb 11.2 oz (77.4 kg) (08/01 0500)  Intake/Output from previous day: 07/31 0701 - 08/01 0700 In: 1210 [P.O.:1210] Out: 1600 [Urine:1600] Intake/Output this shift: Total I/O In: 240 [P.O.:240] Out: -   General appearance: alert, cooperative, fatigued, and no distress Resp: clear to auscultation bilaterally Cardio: regular rate and rhythm, S1, S2 normal, no murmur, click, rub or gallop GI: soft, non-tender; bowel sounds normal; no masses,  no organomegaly Extremities: extremities normal, atraumatic, no cyanosis or edema  Lab Results:  Recent Labs    02/16/21 0315 02/17/21 0211  WBC 22.8* 22.5*  HGB 12.3 11.8*  HCT 36.9 35.5*  PLT 17* 15*   BMET Recent Labs    02/16/21 0315  NA 131*  K 3.6  CL 101  CO2 24  GLUCOSE 106*  BUN 19  CREATININE 0.85  CALCIUM 8.8*    Studies/Results: DG CHEST PORT 1 VIEW  Result Date: 02/15/2021 CLINICAL DATA:  Weakness. EXAM: PORTABLE CHEST 1 VIEW COMPARISON:  Chest radiograph 02/09/2021. FINDINGS: Patient is mildly rotated. Normal cardiac and mediastinal contours. No large area of pulmonary consolidation. No pleural effusion or pneumothorax. IMPRESSION: No acute cardiopulmonary process. Electronically Signed   By: Lovey Newcomer M.D.   On: 02/15/2021 15:38    Medications: I have reviewed the patient's current  medications.   Assessment/Plan: This is a very pleasant 70 years old white female with a history of ITP treated with IVIG and currently on treatment with prednisone.  The patient is also treated with weekly Nplate and the last dose was given at 10 mcg/KG on February 15, 2021 and tolerated the dose well. Repeat CBC today showed persistently low platelets count of 15,000.  I will continue to monitor it closely and if no improvement in the next few days, I may consider The patient for repeat IVIG or treatment with Rituxan. Once the platelet count is 50,000 or higher, the patient can be discharged home on a tapered dose of prednisone and follow-up with Dr. Hinton Rao.  The patient is understandably frustrated with her persistently low platelets count and she would like to go home soon once her platelets are improving. Thank you for taking good care of Jean Kramer, we will continue to follow-up the patient with you and assist in her management.  LOS: 12 days    Jean Kramer 02/17/2021

## 2021-02-17 NOTE — Plan of Care (Signed)

## 2021-02-17 NOTE — Progress Notes (Signed)
   02/17/21 1130  Mobility  Activity Contraindicated/medical hold (Hold per RN)

## 2021-02-17 NOTE — Care Management Important Message (Signed)
Important Message  Patient Details  Name: Allinson Portelli MRN: [id number] Date of Birth: 10/11/1950   Medicare Important Message Given:  Yes     Shelda Altes 02/17/2021, 9:54 AM

## 2021-02-17 NOTE — Progress Notes (Signed)
Physical Therapy Treatment Patient Details Name: Jean Kramer MRN: [id number] DOB: 24-Jun-1951 Today's Date: 02/17/2021    History of Present Illness 70 y/o female presents to [facility] ED on 02/05/2021 with generalized weakness, black stools and SOB. Hgb of 4, platelets less than 5000 upon admission. 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    PT Comments    Today's skilled session continued to focus on mobility. Limited by fatigue and BP readings. Pt continues to move well overall with no much assistance needed. Acute PT to continue during hospital stay.    Follow Up Recommendations  Home health PT     Equipment Recommendations  None recommended by PT    Recommendations for Other Services       Precautions / Restrictions Precautions Precautions: Fall Precaution Comments: monitor Hgb and platelets. BP has been running low as well Restrictions Weight Bearing Restrictions: No    Mobility  Bed Mobility Overal bed mobility: Modified Independent             General bed mobility comments: HOB elevated for supine<>sitting EOB and to scoot up in bed with rails. BP 107/48 supine in bed at start of session. At EOB 81/61 right after sitting up. After 5 minutes 104/77. Pt with reports of increased "woozinesss" after lying back down with BP 105/58. Symptoms improved with gaze stabilization on stable object with deep breathing within 2-3 minutes.    Transfers Overall transfer level: Needs assistance Equipment used: None Transfers: Sit to/from Stand Sit to Stand: Supervision         General transfer comment: no assistance needed. BP 113/68 after standing with pt reporting feeling weak, no dizziness. min guard assist for static standing balance for safety.  Ambulation/Gait             General Gait Details: deferred for safety with low BP and pt not feeling well today        Cognition Arousal/Alertness: Awake/alert Behavior During Therapy:  WFL for tasks assessed/performed Overall Cognitive Status: Within Functional Limits for tasks assessed                      Exercises General Exercises - Lower Extremity Long Arc Quad: AROM;Strengthening;Both;Seated;10 reps Heel Raises: AROM;Strengthening;Both;10 reps;Seated Mini-Sqauts: AROM;Strengthening;Both;10 reps;Seated     Pertinent Vitals/Pain Pain Assessment: 0-10     PT Goals (current goals can now be found in the care plan section) Acute Rehab PT Goals Patient Stated Goal: to get platelets up and go home PT Goal Formulation: With patient Time For Goal Achievement: 02/28/21 Potential to Achieve Goals: Good Additional Goals Additional Goal #1: Pt will score >19/24 on DGI to indicate a reduced risk for falls Progress towards PT goals: Progressing toward goals    Frequency    Min 3X/week      PT Plan Current plan remains appropriate    AM-PAC PT "6 Clicks" Mobility   Outcome Measure  Help needed turning from your back to your side while in a flat bed without using bedrails?: None Help needed moving from lying on your back to sitting on the side of a flat bed without using bedrails?: None Help needed moving to and from a bed to a chair (including a wheelchair)?: A Little Help needed standing up from a chair using your arms (e.g., wheelchair or bedside chair)?: A Little Help needed to walk in hospital room?: A Little Help needed climbing 3-5 steps with a railing? : A Little 6  Click Score: 20    End of Session   Activity Tolerance: Patient tolerated treatment well;Treatment limited secondary to medical complications (Comment);Patient limited by pain (increased back pain with standing, improved once back in bed) Patient left: in bed;with call bell/phone within reach;with family/visitor present Nurse Communication: Mobility status PT Visit Diagnosis: Unsteadiness on feet (R26.81);History of falling (Z91.81)     Time: [id number] PT Time Calculation  (min) (ACUTE ONLY): 18 min  Charges:  $Therapeutic Activity: 8-22 mins                    Willow Ora, PTA, [facility] Acute Rehab Services Office- [phone] 02/17/21, 2:01 PM   Willow Ora 02/17/2021, 2:00 PM

## 2021-02-17 NOTE — Progress Notes (Signed)
PROGRESS NOTE    Jean Kramer  [id number] DOB: 1950/09/05 DOA: 02/05/2021 PCP: [name], MD    Chief Complaint  Patient presents with   Weakness    Weakness for 3 weeks.    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 She is hypotensive on presentation with severe anemia, Hgb of 5, with platelet count of 5. S/p 1 unit of platelet transfusion, and NPlate infusion.  Platelets continue to fluctuate from 10,000 to 31,000. Currently platelets are at 15,000.   Assessment & Plan:   Active Problems:   Acute ITP (HCC)  GI bleed in the setting of thrombocytopenia:  Was on pressors transiently, weaned off and currently stable.  Hemorrhagic shock has resolved, off pressors on 7/22, transferred to hospitalist service on 7/23 No bleeding so far.  GI on board, recommended no EGD until platelets improve.  Hemoglobin stable   Anemia of blood loss possibly from GI bleed.  Hemoglobin improved and stable.  Transfuse to keep hemoglobin greater than 7.  Appreciate GI recommendations.   ITP:  S/P IVIG/steroid/Nplate infusion.  Platelets continue to drop after they peaked at 36,000, fortunately no evidence of bleeding.  Platelet count at 15000 today.  As per oncology, to monitor her few more days to see the counts improve.    No nausea vomiting abdominal pain, hematuria or headache.  Further recommendations as per oncology.  When platelets improve to 50,000 , can be discharged home with outpatient follow up with tapering steroids.  Heme onc on board.    Acute on chronic diastolic CHF.  Improved. Pt is on RA and denies any sob.  Strict intake and output.  Daily weights.  Renal parameters are stable.     COPD;  No wheezing heard, on RA.  Continue to monitor.  Current smoker, smoking cessation counseling provided by provider.    Hypothyroidism:  Resume synthroid.   Mild  hyponatremia:  Asymptomatic.    Hypotension;   Intermittent , mostly in he mornings. Improves with fluid boluses, will start her on midodrine.  Patient remains asymptomatic, no dizziness or weakness. CXR is negative for pneumonia and UA is negative for UTI.  Negative  procalcitonin level. Low cortisol level.  Pt is on 60 mg of prednisone.  Patient is currently off all blood pressure medications and Lasix.  Leukocytosis:  Probably from steroids  and Nplate injection CXR does not show obvious pneumonia. She denies any sob or cough.     Pressure injury.  Pressure Injury 02/05/21 Sacrum Left;Medial Deep Tissue Pressure Injury - Purple or maroon localized area of discolored intact skin or blood-filled blister due to damage of underlying soft tissue from pressure and/or shear. (Active)  02/05/21 2000  Location: Sacrum  Location Orientation: Left;Medial  Staging: Deep Tissue Pressure Injury - Purple or maroon localized area of discolored intact skin or blood-filled blister due to damage of underlying soft tissue from pressure and/or shear.  Wound Description (Comments):   Present on Admission: Yes  Resume wound care.      DVT prophylaxis: scd's Code Status: full code.  Family Communication: None at bedside Disposition:   Status is: Inpatient  Remains inpatient appropriate because:Inpatient level of care appropriate due to severity of illness  Dispo: The patient is from: Home              Anticipated d/c is to: Home  Patient currently is not medically stable to d/c.   Difficult to place patient No       Consultants:  Hematology Gi.   Procedures: none.   Antimicrobials: none.    Subjective: Appears to be in good spirits. Wants to know when she can go home.   Objective: Vitals:   02/17/21 0500 02/17/21 0847 02/17/21 0851 02/17/21 1116  BP: (!) 96/57   (!) 100/43  Pulse: 71   65  Resp: 17     Temp: 98.1 F (36.7 C)   97.8 F (36.6 C)  TempSrc:  Oral   Oral  SpO2: 97% 99% 100% 99%  Weight: 77.4 kg       Intake/Output Summary (Last 24 hours) at 02/17/2021 1237 Last data filed at 02/17/2021 [id number] Gross per 24 hour  Intake 970 ml  Output 1600 ml  Net -630 ml    Filed Weights   02/15/21 0442 02/16/21 0402 02/17/21 0500  Weight: 76.9 kg 77.4 kg 77.4 kg    Examination:  General exam: Elderly woman no distress noted.  Respiratory system: clear to auscultation, no wheezing heard.  Cardiovascular system: RRR, no JVD, no pedal edema.  Gastrointestinal system: Abdomen is soft, NT ND BS+ Central nervous system: Alert and oriented, non focal.  Extremities: No pedal edema.  Skin: Sacral deep tissue injury found Psychiatry: mood is appropriate.     Data Reviewed: I have personally reviewed following labs and imaging studies  CBC: Recent Labs  Lab 02/13/21 1904 02/14/21 0227 02/15/21 0236 02/16/21 0315 02/17/21 0211  WBC 11.9* 16.9* 21.6* 22.8* 22.5*  NEUTROABS 10.4* 11.6* 15.5* 15.7* 16.4*  HGB 13.3 12.5 13.3 12.3 11.8*  HCT 39.4 37.1 38.4 36.9 35.5*  MCV 97.8 97.4 96.2 98.1 97.3  PLT 36* 25* 26* 17* 15*     Basic Metabolic Panel: Recent Labs  Lab 02/12/21 0244 02/16/21 0315  NA 133* 131*  K 3.7 3.6  CL 98 101  CO2 27 24  GLUCOSE 96 106*  BUN 21 19  CREATININE 0.77 0.85  CALCIUM 8.8* 8.8*     GFR: CrCl cannot be calculated (Unknown ideal weight.).  Liver Function Tests: No results for input(s): AST, ALT, ALKPHOS, BILITOT, PROT, ALBUMIN in the last 168 hours.   CBG: No results for input(s): GLUCAP in the last 168 hours.    Recent Results (from the past 240 hour(s))  Urine Culture     Status: Abnormal   Collection Time: 02/15/21  2:34 PM   Specimen: Urine, Clean Catch  Result Value Ref Range Status   Specimen Description URINE, CLEAN CATCH  Final   Special Requests Normal  Final   Culture (A)  Final    <10,000 COLONIES/mL INSIGNIFICANT GROWTH Performed at [facility], [address] [place], [place] [address]    Report Status 02/16/2021 FINAL  Final           Radiology Studies: DG CHEST PORT 1 VIEW  Result Date: 02/15/2021 CLINICAL DATA:  Weakness. EXAM: PORTABLE CHEST 1 VIEW COMPARISON:  Chest radiograph 02/09/2021. FINDINGS: Patient is mildly rotated. Normal cardiac and mediastinal contours. No large area of pulmonary consolidation. No pleural effusion or pneumothorax. IMPRESSION: No acute cardiopulmonary process. Electronically Signed   By: Lovey Newcomer M.D.   On: 02/15/2021 15:38        Scheduled Meds:  sodium chloride   Intravenous Once   sodium chloride   Intravenous Once   atorvastatin  20 mg Oral Daily   chlorhexidine  15 mL Mouth Rinse BID   escitalopram  20 mg Oral Daily   fluticasone  2 spray Each Nare Daily   guaiFENesin  1,200 mg Oral BID   levothyroxine  25 mcg Oral q morning   lidocaine  1 patch Transdermal Q24H   mouth rinse  15 mL Mouth Rinse q12n4p   midodrine  5 mg Oral TID WC   mometasone-formoterol  2 puff Inhalation BID   pantoprazole  40 mg Oral BID   predniSONE  60 mg Oral Q breakfast   QUEtiapine  100 mg Oral QHS   romiPLOStim  10 mcg/kg Subcutaneous Weekly   rOPINIRole  0.5 mg Oral TID   Continuous Infusions:  sodium chloride     sodium chloride     sodium chloride     sodium chloride     sodium chloride       LOS: 12 days        [name], MD [facility]   To contact the attending provider between 7A-7P or the covering provider during after hours 7P-7A, please log into the web site [email or web address] and access using universal  password for that web site. If you do not have the password, please call the hospital operator.  02/17/2021, 12:37 PM

## 2021-02-18 DIAGNOSIS — R571 Hypovolemic shock: Secondary | ICD-10-CM | POA: Diagnosis not present

## 2021-02-18 DIAGNOSIS — D62 Acute posthemorrhagic anemia: Secondary | ICD-10-CM | POA: Diagnosis not present

## 2021-02-18 DIAGNOSIS — D693 Immune thrombocytopenic purpura: Secondary | ICD-10-CM | POA: Diagnosis not present

## 2021-02-18 LAB — CBC WITH DIFFERENTIAL/PLATELET
Abs Immature Granulocytes: 0.22 10*3/uL — ABNORMAL HIGH (ref 0.00–0.07)
Basophils Absolute: 0.1 10*3/uL (ref 0.0–0.1)
Basophils Relative: 0 %
Eosinophils Absolute: 0 10*3/uL (ref 0.0–0.5)
Eosinophils Relative: 0 %
HCT: 36.4 % (ref 36.0–46.0)
Hemoglobin: 11.9 g/dL — ABNORMAL LOW (ref 12.0–15.0)
Immature Granulocytes: 1 %
Lymphocytes Relative: 18 %
Lymphs Abs: 4 10*3/uL (ref 0.7–4.0)
MCH: 32.7 pg (ref 26.0–34.0)
MCHC: 32.7 g/dL (ref 30.0–36.0)
MCV: 100 fL (ref 80.0–100.0)
Monocytes Absolute: 2.2 10*3/uL — ABNORMAL HIGH (ref 0.1–1.0)
Monocytes Relative: 10 %
Neutro Abs: 15.9 10*3/uL — ABNORMAL HIGH (ref 1.7–7.7)
Neutrophils Relative %: 71 %
Platelets: 33 10*3/uL — ABNORMAL LOW (ref 150–400)
RBC: 3.64 MIL/uL — ABNORMAL LOW (ref 3.87–5.11)
RDW: 17.2 % — ABNORMAL HIGH (ref 11.5–15.5)
WBC: 22.4 10*3/uL — ABNORMAL HIGH (ref 4.0–10.5)
nRBC: 0 % (ref 0.0–0.2)

## 2021-02-18 NOTE — Plan of Care (Signed)

## 2021-02-18 NOTE — Progress Notes (Signed)
PROGRESS NOTE    Jean Kramer  [id number] DOB: [birth year] DOA: 02/05/2021 PCP: [name], MD    Chief Complaint  Patient presents with   Weakness    Weakness for 3 weeks.    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 She is hypotensive on presentation with severe anemia, Hgb of 5, with platelet count of 5. S/p 1 unit of platelet transfusion, and NPlate infusion.  Platelets continue to fluctuate from 10,000 to 31,000. PLATELETS have improved to 36,000.   Assessment & Plan:   Active Problems:   Acute ITP (HCC)  GI bleed in the setting of thrombocytopenia:  Was on pressors transiently, weaned off and currently stable.  Hemorrhagic shock has resolved, off pressors on 7/22, transferred to hospitalist service on 7/23 No bleeding so far.  GI on board, recommended no EGD until platelets improve.  Hemoglobin stable   Anemia of blood loss possibly from GI bleed.  Hemoglobin improved and stable.  Transfuse to keep hemoglobin greater than 7.  Appreciate GI recommendations.   ITP:  S/P IVIG/steroid/Nplate infusion.  Platelets continue to drop after they peaked at 36,000, fortunately no evidence of bleeding.  Platelet count at 36000 today.  As per oncology, to monitor her few more days to see the counts improve.    No nausea vomiting abdominal pain, hematuria or headache.  Further recommendations as per oncology.  When platelets improve to 50,000 , can be discharged home with outpatient follow up with tapering steroids.  Heme onc on board.    Acute on chronic diastolic CHF.  Improved. Pt is on RA and denies any sob.  Strict intake and output.  Daily weights.  Renal parameters are stable.     COPD;  No wheezing heard, on RA.  Continue to monitor.  Current smoker, smoking cessation counseling provided by provider.    Hypothyroidism:  Resume synthroid.   Mild  hyponatremia:  Asymptomatic.    Hypotension;   Intermittent , mostly in he mornings. Improves with fluid boluses, will start her on midodrine.  Pts bp improved after starting her on midodrine. Continue the same dose.  Patient remains asymptomatic, no dizziness or weakness. CXR is negative for pneumonia and UA is negative for UTI.  Negative  procalcitonin level. Low cortisol level.  Pt is on 60 mg of prednisone.  Patient is currently off all blood pressure medications and Lasix.  Leukocytosis:  Probably from steroids  and Nplate injection CXR does not show obvious pneumonia. She denies any sob or cough.  Urine cultures show less than 10,000 bacteria.     Pressure injury.  Pressure Injury 02/05/21 Sacrum Left;Medial Deep Tissue Pressure Injury - Purple or maroon localized area of discolored intact skin or blood-filled blister due to damage of underlying soft tissue from pressure and/or shear. (Active)  02/05/21 2000  Location: Sacrum  Location Orientation: Left;Medial  Staging: Deep Tissue Pressure Injury - Purple or maroon localized area of discolored intact skin or blood-filled blister due to damage of underlying soft tissue from pressure and/or shear.  Wound Description (Comments):   Present on Admission: Yes  Resume wound care.      DVT prophylaxis: scd's Code Status: full code.  Family Communication: None at bedside Disposition:   Status is: Inpatient  Remains inpatient appropriate because:Inpatient level of care appropriate due to severity of illness  Dispo: The patient is from: Home  Anticipated d/c is to: Home              Patient currently is not medically stable to d/c.   Difficult to place patient No       Consultants:  Hematology Gi.   Procedures: none.   Antimicrobials: none.    Subjective: No new complaints this am.   Objective: Vitals:   02/17/21 1954 02/18/21 0406 02/18/21 0600 02/18/21 0800  BP:  108/69    Pulse: 80 70  74   Resp: '18 20  20  '$ Temp:  98.2 F (36.8 C)    TempSrc:  Oral    SpO2: 95% 98%    Weight:   77.9 kg     Intake/Output Summary (Last 24 hours) at 02/18/2021 [id number] Last data filed at 02/18/2021 0400 Gross per 24 hour  Intake 220 ml  Output 700 ml  Net -480 ml    Filed Weights   02/16/21 0402 02/17/21 0500 02/18/21 0600  Weight: 77.4 kg 77.4 kg 77.9 kg    Examination:  General exam: Elderly woman appears comfortable not in any distress Respiratory system: Clear to auscultation bilaterally, no wheezing or rhonchi Cardiovascular system: Regular rate rhythm, no JVD no pedal edema Gastrointestinal system: Abdomen is soft nontender nondistended bowel sounds normal alert Central nervous system: And oriented, grossly nonfocal Extremities: No pedal edema or cyanosis mood is Skin: Sacral deep tissue injury found Psychiatry: Mood is appropriate    Data Reviewed: I have personally reviewed following labs and imaging studies  CBC: Recent Labs  Lab 02/14/21 0227 02/15/21 0236 02/16/21 0315 02/17/21 0211 02/18/21 0411  WBC 16.9* 21.6* 22.8* 22.5* 22.4*  NEUTROABS 11.6* 15.5* 15.7* 16.4* 15.9*  HGB 12.5 13.3 12.3 11.8* 11.9*  HCT 37.1 38.4 36.9 35.5* 36.4  MCV 97.4 96.2 98.1 97.3 100.0  PLT 25* 26* 17* 15* 33*     Basic Metabolic Panel: Recent Labs  Lab 02/12/21 0244 02/16/21 0315  NA 133* 131*  K 3.7 3.6  CL 98 101  CO2 27 24  GLUCOSE 96 106*  BUN 21 19  CREATININE 0.77 0.85  CALCIUM 8.8* 8.8*     GFR: CrCl cannot be calculated (Unknown ideal weight.).  Liver Function Tests: No results for input(s): AST, ALT, ALKPHOS, BILITOT, PROT, ALBUMIN in the last 168 hours.   CBG: No results for input(s): GLUCAP in the last 168 hours.    Recent Results (from the past 240 hour(s))  Urine Culture     Status: Abnormal   Collection Time: 02/15/21  2:34 PM   Specimen: Urine, Clean Catch  Result Value Ref Range Status   Specimen Description URINE, CLEAN CATCH  Final    Special Requests Normal  Final   Culture (A)  Final    <10,000 COLONIES/mL INSIGNIFICANT GROWTH Performed at [facility], [address], [place], [place] [address]    Report Status 02/16/2021 FINAL  Final           Radiology Studies: No results found.      Scheduled Meds:  sodium chloride   Intravenous Once   sodium chloride   Intravenous Once   atorvastatin  20 mg Oral Daily   chlorhexidine  15 mL Mouth Rinse BID   escitalopram  20 mg Oral Daily   fluticasone  2 spray Each Nare Daily   guaiFENesin  1,200 mg Oral BID   levothyroxine  25 mcg Oral q morning   lidocaine  1 patch Transdermal Q24H   mouth rinse  15 mL Mouth Rinse q12n4p   midodrine  5 mg Oral TID WC   mometasone-formoterol  2 puff Inhalation BID   pantoprazole  40 mg Oral BID   predniSONE  60 mg Oral Q breakfast   QUEtiapine  100 mg Oral QHS   romiPLOStim  10 mcg/kg Subcutaneous Weekly   rOPINIRole  0.5 mg Oral TID   Continuous Infusions:  sodium chloride     sodium chloride     sodium chloride     sodium chloride     sodium chloride       LOS: 13 days        [name], MD [facility]   To contact the attending provider between 7A-7P or the covering provider during after hours 7P-7A, please log into the web site [email or web address] and access using universal [place] password for that web site. If you do not have the password, please call the hospital operator.  02/18/2021, 8:38 AM

## 2021-02-18 NOTE — TOC Progression Note (Signed)
Transition of Care [facility]) - Progression Note    Patient Details  Name: Jean Kramer MRN: [id number] Date of Birth: 11/18/1950  Transition of Care [facility]) CM/SW Contact  Jean Mayo, RN Phone Number: 02/18/2021, 9:33 AM  Clinical Narrative:    NCM spoke with patient at bedside, offered choice, she does not have a preference,  NCM made referral to [facility] with [place] for [place].  She is able to take referral.  Soc willl begin 24 to 48hrs post dc.  Patient has walker and a cane at home. He son , Jean Kramer takes her to [facility] and he picks up her medications for her, she [facility] use [facility] in [place] and also [facility] for mailing prescriptions.  She states her son will transport her home at discharge.        Expected Discharge Plan and Services                                                 Social Determinants of Health (SDOH) Interventions    Readmission Risk Interventions No flowsheet data found.

## 2021-02-18 NOTE — TOC Progression Note (Signed)
Transition of Care [facility]) - Progression Note    Patient Details  Name: Jean Kramer MRN: [id number] Date of Birth: [birth year]  Transition of Care [facility]) CM/SW Contact  [name], RN Phone Number: 02/18/2021, 10:38 AM  Clinical Narrative:    NCM spoke with patient at bedside, offered choice, she does not have a preference,  NCM made referral to [facility] with [place] for [place].  She is able to take referral.  Soc willl begin 24 to 48hrs post dc.  Patient has walker and a cane at home. He son , Elta [place] takes her to [facility] and he picks up her medications for her, she [facility] [place] and also [facility] for mailing prescriptions.  She states her son will transport her home at discharge.     Expected Discharge Plan: [place] Barriers to Discharge: Continued Medical Work up  Expected Discharge Plan and Services Expected Discharge Plan: [place]   Discharge Planning Services: CM Consult Post Acute Care Choice: [place] arrangements for the past 2 months: Single Family Home                   DME Agency: NA       HH Arranged: PT HH Agency: [place] Date [place]: 02/18/21 Time [place]: 1036 Representative spoke with at [place]: [place] ([place]) Interventions    Readmission Risk Interventions No flowsheet data found.

## 2021-02-19 DIAGNOSIS — D62 Acute posthemorrhagic anemia: Secondary | ICD-10-CM | POA: Diagnosis not present

## 2021-02-19 DIAGNOSIS — D693 Immune thrombocytopenic purpura: Secondary | ICD-10-CM | POA: Diagnosis not present

## 2021-02-19 DIAGNOSIS — R571 Hypovolemic shock: Secondary | ICD-10-CM | POA: Diagnosis not present

## 2021-02-19 LAB — CBC
HCT: 38 % (ref 36.0–46.0)
Hemoglobin: 12.3 g/dL (ref 12.0–15.0)
MCH: 32.6 pg (ref 26.0–34.0)
MCHC: 32.4 g/dL (ref 30.0–36.0)
MCV: 100.8 fL — ABNORMAL HIGH (ref 80.0–100.0)
Platelets: 36 10*3/uL — ABNORMAL LOW (ref 150–400)
RBC: 3.77 MIL/uL — ABNORMAL LOW (ref 3.87–5.11)
RDW: 17.3 % — ABNORMAL HIGH (ref 11.5–15.5)
WBC: 24.2 10*3/uL — ABNORMAL HIGH (ref 4.0–10.5)
nRBC: 0 % (ref 0.0–0.2)

## 2021-02-19 MED ORDER — SODIUM CHLORIDE 0.9 % IV BOLUS
500.0000 mL | Freq: Once | INTRAVENOUS | Status: AC
  Administered 2021-02-19: 500 mL via INTRAVENOUS

## 2021-02-19 MED ORDER — MIDODRINE HCL 5 MG PO TABS
10.0000 mg | ORAL_TABLET | Freq: Three times a day (TID) | ORAL | Status: DC
  Administered 2021-02-19 – 2021-02-20 (×4): 10 mg via ORAL
  Filled 2021-02-19 (×4): qty 2

## 2021-02-19 NOTE — Progress Notes (Signed)
[facility]  [address] [place],  [place]  [address] [phone]  Clinic Day:  02/26/2021  Referring physician: [name], MD  This document serves as a record of services personally performed by [name], MD. It was created on their behalf by Curry,[name], a trained medical scribe. The creation of this record is based on the scribe's personal observations and the provider's statements to them.  CHIEF COMPLAINT:  CC: History of ITP  Current Treatment:  Surveillance   HISTORY OF PRESENT ILLNESS:  Jean Kramer is a 70 y.o. 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 She responded to that but over time has been on and off corticosteroids for relapsing ITP, and was treated with danazol for a time.  Her ITP has currently been in remission for many years but she does have a chronic leukocytosis.  She started smoking at age 4 and so she has been smoking approximately 1 pack per day for 49 years and continues to smoke despite numerous discussions about smoking cessation.  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  We recommended an ultrasound-guided biopsy and this revealed fibroadenomatoid and fibrocystic changes including cystic and micro papillary apocrine metaplasia, apocrine adenosis, stromal fibrosis, and foci of pseudo angiomatous stromal hyperplasia and focal sclerosis.  It was recommended that she have excision of this area and that was performed in July by Dr. Melynda Ripple.  The final pathology revealed atrophic breast tissue with fibrocystic changes and foci of usual ductal hyperplasia but no intraductal papilloma, fibroadenoma,  atypia or malignancy.  There was another lumpectomy sample which revealed micro papillary ductal hyperplasia, focal ectatic ducts, and patchy peri ductal chronic inflammation.  The other area of lumpectomy had morphologic changes similar to the original biopsy.    INTERVAL HISTORY:  Jean Kramer is here after not being seen since May 2019.  She was just released from the hospital due to developing an upper GI bleed from taking BC powders.  I had advised her to discontinue these previously.  She is currently taking Tylenol for her pain.  While in the hospital her platelet count dropped into the teens, and she was given steroids.   She has also noticed that she was covered in bruises. She was in the hospital for a total of 15 days and her platelet count had gone up to 63,000 at the time of discharge.  She was given a prescription for prednisone at the time of discharge but I advised her not to take this.  She continues to have occasional falls, and states that she would benefit more from a walker.  I will write her a prescription for a walker today.  She notes arthralgias and back pain.  She continues routine blood work every 3 months with Dr. Jannette Fogo.  Blood counts and chemistries are unremarkable except for a chronic leukocytosis with a white count of 14.0, and mildly elevated liver transaminases, secondary to fatty liver.  Her  appetite is good, and she has been eating well.  She denies fever, chills or other signs of infection.  She denies nausea, vomiting, bowel issues, or abdominal  pain.  She denies sore throat, cough, dyspnea, or chest pain.  REVIEW OF SYSTEMS:  Review of Systems  Constitutional: Negative.  Negative for appetite change, chills, fatigue, fever and unexpected weight change.  HENT:  Negative.    Eyes: Negative.   Respiratory: Negative.  Negative for chest tightness, cough, hemoptysis, shortness of breath and wheezing.   Cardiovascular: Negative.  Negative for chest pain, leg swelling and  palpitations.  Gastrointestinal: Negative.  Negative for abdominal distention, abdominal pain, blood in stool, constipation, diarrhea, nausea and vomiting.  Endocrine: Negative.   Genitourinary: Negative.  Negative for difficulty urinating, dysuria, frequency and hematuria.   Musculoskeletal:  Positive for arthralgias (of the knees), back pain and gait problem (she uses a cane to ambulate). Negative for flank pain and myalgias.  Skin: Negative.   Neurological:  Positive for gait problem (she uses a cane to ambulate). Negative for dizziness, extremity weakness, headaches, light-headedness, numbness, seizures and speech difficulty.  Hematological: Negative.   Psychiatric/Behavioral: Negative.  Negative for depression and sleep disturbance. The patient is not nervous/anxious.     VITALS:  Blood pressure (!) 125/52, pulse 89, temperature 98.2 F (36.8 C), temperature source Oral, resp. rate 20, height '5\' 2"'$  (1.575 m), weight 174 lb 6.4 oz (79.1 kg), SpO2 93 %.  Wt Readings from Last 3 Encounters:  02/26/21 174 lb 6.4 oz (79.1 kg)  02/20/21 174 lb 6.4 oz (79.1 kg)    Body mass index is 31.9 kg/m.  Performance status (ECOG): 1 - Symptomatic but completely ambulatory  PHYSICAL EXAM:  Physical Exam Constitutional:      General: She is not in acute distress.    Appearance: Normal appearance. She is normal weight.  HENT:     Head: Normocephalic and atraumatic.  Eyes:     General: No scleral icterus.    Extraocular Movements: Extraocular movements intact.     Conjunctiva/sclera: Conjunctivae normal.     Pupils: Pupils are equal, round, and reactive to light.  Cardiovascular:     Rate and Rhythm: Normal rate and regular rhythm.     Pulses: Normal pulses.     Heart sounds: Normal heart sounds. No murmur heard.   No friction rub. No gallop.  Pulmonary:     Effort: Pulmonary effort is normal. No respiratory distress.     Breath sounds: Normal breath sounds.  Chest:     Comments: She has a  well healed scar at 8 o'clock in the right breast adjacent to the areolar complex. Abdominal:     General: Bowel sounds are normal. There is no distension.     Palpations: Abdomen is soft. There is no hepatomegaly, splenomegaly or mass.     Tenderness: There is no abdominal tenderness.     Comments: Large well healed scar in the left upper quadrant.  Musculoskeletal:        General: Normal range of motion.     Cervical back: Normal range of motion and neck supple.     Right lower leg: No edema.     Left lower leg: No edema.  Lymphadenopathy:     Cervical: No cervical adenopathy.  Skin:    General: Skin is warm and dry.  Neurological:     General: No focal deficit present.     Mental Status: She is alert and oriented to person, place, and time. Mental status is at baseline.  Psychiatric:        Mood and Affect: Mood normal.  Behavior: Behavior normal.        Thought Content: Thought content normal.        Judgment: Judgment normal.    LABS:   CBC Latest Ref Rng & Units 02/26/2021 02/20/2021 02/19/2021  WBC - 14.0 26.9(H) 24.2(H)  Hemoglobin 12.0 - 16.0 13.7 12.6 12.3  Hematocrit 36 - 46 43 39.3 38.0  Platelets 150 - 399 168 63(L) 36(L)   CMP Latest Ref Rng & Units 02/26/2021 02/20/2021 02/16/2021  Glucose 70 - 99 mg/dL - 112(H) 106(H)  BUN 4 - '21 19 18 19  '$ Creatinine 0.5 - 1.1 0.9 0.81 0.85  Sodium 137 - 147 137 132(L) 131(L)  Potassium 3.4 - 5.3 4.0 4.7 3.6  Chloride 99 - 108 102 96(L) 101  CO2 13 - 22 27(A) 28 24  Calcium 8.7 - 10.7 9.4 9.2 8.8(L)  Total Protein 6.5 - 8.1 g/dL - - -  Total Bilirubin 0.3 - 1.2 mg/dL - - -  Alkaline Phos 25 - 125 132(A) - -  AST 13 - 35 52(A) - -  ALT 7 - 35 58(A) - -    Lab Results  Component Value Date   TIBC 330 02/08/2021   FERRITIN 137 02/08/2021   IRONPCTSAT 11 02/08/2021   No results found for: LDH   STUDIES:  CT Head Wo Contrast  Result Date: 02/05/2021 CLINICAL DATA:  Headache, suspected intracranial hemorrhage EXAM:  CT HEAD WITHOUT CONTRAST TECHNIQUE: Contiguous axial images were obtained from the base of the skull through the vertex without intravenous contrast. COMPARISON:  03/31/2006 FINDINGS: Brain: No evidence of acute infarction, hemorrhage, hydrocephalus, extra-axial collection or mass lesion/mass effect. Mild atrophy. Vascular: Atherosclerotic and physiologic intracranial calcifications. Skull: Normal. Negative for fracture or focal lesion. Sinuses/Orbits: Dense opacification of the left maxillary sinus and partial opacification of left ethmoid air cells. Orbits unremarkable. Other: 2cm parietal scalp lesion near the midline and1 cm right parietooccipital scalp lesion, possibly sebaceous cyst but nonspecific. IMPRESSION: 1. No acute intracranial process. 2. Left maxillary and ethmoid sinus disease Electronically Signed   By: Lucrezia Europe M.D.   On: 02/05/2021 14:51   DG CHEST PORT 1 VIEW  Result Date: 02/15/2021 CLINICAL DATA:  Weakness. EXAM: PORTABLE CHEST 1 VIEW COMPARISON:  Chest radiograph 02/09/2021. FINDINGS: Patient is mildly rotated. Normal cardiac and mediastinal contours. No large area of pulmonary consolidation. No pleural effusion or pneumothorax. IMPRESSION: No acute cardiopulmonary process. Electronically Signed   By: Lovey Newcomer M.D.   On: 02/15/2021 15:38   DG CHEST PORT 1 VIEW  Result Date: 02/09/2021 CLINICAL DATA:  Wheezing.  History of COPD. EXAM: PORTABLE CHEST 1 VIEW COMPARISON:  02/08/2021 FINDINGS: Cardiac silhouette is normal in size. No mediastinal or hilar masses. There is hazy opacity at the right lung base partly silhouetting the lateral right hemidiaphragm. Remainder of the lungs is clear. Possible right pleural effusion. No convincing left pleural effusion. No pneumothorax. Skeletal structures are grossly intact. IMPRESSION: 1. No definite change from the previous day's study allowing for differences in patient positioning and technique. 2. Opacity at the right lung base is likely a  combination of a small effusion and atelectasis. Pneumonia should be considered if there are consistent clinical findings. 3. No evidence of pulmonary edema. Electronically Signed   By: Lajean Manes M.D.   On: 02/09/2021 16:00   DG CHEST PORT 1 VIEW  Result Date: 02/08/2021 CLINICAL DATA:  Cough, weakness. EXAM: PORTABLE CHEST 1 VIEW COMPARISON:  Chest radiograph dated 09/20/2007. FINDINGS: The heart  size is normal. Vascular calcifications are seen in the aortic arch. There are mild bilateral lower lung predominant interstitial and airspace opacities. A small right pleural effusion may contribute. There is no left pleural effusion. There is no pneumothorax on either side. No acute osseous injury. IMPRESSION: Mild bilateral lower lung predominant interstitial and airspace opacities may represent pulmonary edema and/or pneumonia. A small right pleural effusion may contribute. Electronically Signed   By: Zerita Boers M.D.   On: 02/08/2021 17:34     EXAM: 07/03/2019 DIGITAL SCREENING BILATERAL MAMMOGRAM WITH TOMO AND CAD  COMPARISON:  Previous exam(s).  ACR Breast Density Category a: The breast tissue is almost entirely fatty.  FINDINGS: There are no findings suspicious for malignancy.  RIGHT breast surgical changes again noted.  Images were processed with CAD.  IMPRESSION: No mammographic evidence of malignancy. A result letter of this screening mammogram will be mailed directly to the patient.   EXAM: 07/03/2019 DUAL X-RAY ABSORPTIOMETRY (DXA) FOR BONE MINERAL DENSITY  IMPRESSION: Jean Kramer completed a BMD test on 07/03/2019 using the [place] (analysis version: 13.60) manufactured by [employer]. The following summarizes the results of our evaluation.  PATIENT BIOGRAPHICAL: Name: Jean Kramer, Jean Kramer Patient ID: [id number] [facility] Birth Date: 05/02/1951 Height: 63.0 in. Gender: Female Exam Date: 07/03/2019 Weight: 163.0 lbs. Indications: Tobacco User (Current Smoker),  History of Fracture (Adult) Fractures: Treatments:  ASSESSMENT: The BMD measured at Femur Total Right is 0.755 g/cm2 with a T-score of -2.0. This patient is considered osteopenic according to [facility]) criteria.  The scan quality is good. Patient does not meet criteria for FRAX assessment due to bone building therapy medication.  Site Region Measured Measured WHO Young Adult BMD Date       Age      Classification T-score AP Spine L1-L4 07/03/2019 68.6 Normal -0.6 1.103 g/cm2 AP Spine L1-L4 10/31/2012 62.0 Osteopenia -1.5 0.996 g/cm2  DualFemur Total Right 07/03/2019 68.6 Osteopenia -2.0 0.755 g/cm2 DualFemur Total Right 10/31/2012 62.0 Osteopenia -2.0 0.758 g/cm2  DualFemur Total Mean 07/03/2019 68.6 Osteopenia -2.0 0.757 g/cm2 DualFemur Total Mean 10/31/2012 62.0 Osteopenia -1.8 0.785 g/cm2   Allergies: No Known Allergies  Current Medications: Current Outpatient Medications  Medication Sig Dispense Refill   acetaminophen (TYLENOL) 500 MG tablet Take 1,000 mg by mouth every 6 (six) hours as needed for mild pain.     alendronate (FOSAMAX) 70 MG tablet Take 70 mg by mouth once a week. Sunday     ALPRAZolam (XANAX) 1 MG tablet Take 0.5-1 mg by mouth daily as needed for anxiety. Only gets 10 pills a month     [START ON 03/06/2021] aspirin 81 MG EC tablet Take 1 tablet (81 mg total) by mouth daily. 30 tablet 12   atorvastatin (LIPITOR) 20 MG tablet Take 1 tablet (20 mg total) by mouth daily. 30 tablet 1   budesonide-formoterol (SYMBICORT) 160-4.5 MCG/ACT inhaler Inhale 2 puffs into the lungs 2 (two) times daily as needed (shortness of breath).     busPIRone (BUSPAR) 15 MG tablet Take 15 mg by mouth 2 (two) times daily.     calcium-vitamin D (OSCAL WITH D) 500-200 MG-UNIT TABS tablet Take 1 tablet by mouth 2 (two) times daily with a meal.     escitalopram (LEXAPRO) 20 MG tablet Take 20 mg by mouth daily.     furosemide (LASIX) 20 MG tablet Take 20 mg by mouth daily.      ipratropium-albuterol (DUONEB) 0.5-2.5 (3) MG/3ML SOLN Inhale 3 mLs into  the lungs every 6 (six) hours as needed for shortness of breath.     levothyroxine (SYNTHROID) 25 MCG tablet Take 25 mcg by mouth every morning.     LINZESS 290 MCG CAPS capsule Take 290 mcg by mouth daily as needed (constipation).     midodrine (PROAMATINE) 10 MG tablet Take 1 tablet (10 mg total) by mouth 3 (three) times daily with meals. 90 tablet 0   Oxycodone HCl 20 MG TABS Take 1 tablet by mouth every 8 (eight) hours as needed for severe pain.     pantoprazole (PROTONIX) 40 MG tablet Take 1 tablet (40 mg total) by mouth 2 (two) times daily. 60 tablet 2   pramipexole (MIRAPEX) 0.5 MG tablet Take 0.5 mg by mouth at bedtime.     QUEtiapine (SEROQUEL) 200 MG tablet Take 200 mg by mouth at bedtime.     rOPINIRole (REQUIP) 0.5 MG tablet Take 0.5 mg by mouth 3 (three) times daily.     predniSONE (DELTASONE) 20 MG tablet Prednisone 60 mg daily for 1 week followed by  Prednisone 50 mg daily for 1 week followed by  Prednisone 40 mg daily for 1 week followed by  Prednisone 30 mg daily for 1 week followed by  Prednisone 20 mg daily for 1 week followed by  Prednisone 10 mg daily for 1 weeks followed by  Prednisone 5 mg daily for 1 weeks. (Patient not taking: Reported on 02/26/2021) 65 tablet 0   No current facility-administered medications for this visit.     ASSESSMENT & PLAN:   Assessment:   1. History of ITP with multiple relapses.  She has a relapse again while hospitalized for a serious GI bleed.  I think that the excessive blood loss may have triggered this and her platelet count was up to 63,000 by the time of discharge.    2. History of splenectomy, twice.  3. Chronic leukocytosis.  4. Tobacco abuse.  She is not motivated to quit.  5. History of lumpectomy in July 2018 which was benign.    6.  Osteopenia, stable to improved.  She will be due for repeat examination in December 2022, which will be scheduled  through Dr. Jannette Fogo.    Plan: She has returned to baseline since discharge from the hospital and is doing fairly well.  He platelets have improved and are now normal at 168,000 so I advised her not to take prednisone.  I wrote her a prescription for a walker today as she has had multiple falls while using her cane.  She is past due for annual mammography, and so I will schedule this for her.  I will see her back in 6 weeks with CBC and comprehensive metabolic profile.  If all is well, we can see her every 6-12 months.  She understands and agrees with this plan of care.     I provided 20 minutes of face-to-face time during this this encounter and > 50% was spent counseling as documented under my assessment and plan.    [name], MD [facility] [address] [place] [address] Dept: [phone] Dept Fax: [phone]   I, Rita Ohara, am acting as scribe for [name], MD  I have reviewed this report as typed by the medical scribe, and it is complete and accurate.

## 2021-02-19 NOTE — Progress Notes (Signed)
PROGRESS NOTE    Jean Kramer  [id number] DOB: Apr 21, 1951 DOA: 02/05/2021 PCP: [name], MD    Chief Complaint  Patient presents with   Weakness    Weakness for 3 weeks.    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 She is hypotensive on presentation with severe anemia, Hgb of 5, with platelet count of 5. S/p 1 unit of platelet transfusion, and NPlate infusion.  Platelets continue to fluctuate from 10,000 to 31,000. platelets have improved to 36,000.   Assessment & Plan:   Active Problems:   Acute ITP (HCC)  GI bleed in the setting of thrombocytopenia:  Was on pressors transiently, weaned off and currently stable.  Hemorrhagic shock has resolved, off pressors on 7/22, transferred to hospitalist service on 7/23 No bleeding so far.  GI on board, recommended no EGD until platelets improve.  Hemoglobin stable around 12.    Anemia of blood loss possibly from GI bleed.  Hemoglobin improved and stable.  Transfuse to keep hemoglobin greater than 7.  Appreciate GI recommendations.   ITP:  S/P IVIG/steroid/Nplate infusion.  Platelet count at 36,000 today.  As per oncology, to monitor her few more days to see the counts improve.  No nausea, vomiting or abdominal pain.   Further recommendations as per oncology.  When platelets improve to 50,000 , can be discharged home with outpatient follow up with tapering steroids.  Heme onc on board.    Acute on chronic diastolic CHF.  Improved. Pt is on RA and denies any sob.  Strict intake and output.  Daily weights.  Recheck renal parameters in am.     COPD;  No wheezing heard, on RA.  Continue to monitor.  Current smoker, smoking cessation counseling provided by provider.    Hypothyroidism:  Resume synthroid.   Mild hyponatremia:  Asymptomatic. Recheck sodium in am.    Hypotension;   Intermittent , mostly in  the mornings. Started her on midodrine and increased the dose to 10 mg TID.  Patient remains asymptomatic, no dizziness or weakness. CXR is negative for pneumonia and UA is negative for UTI.  Negative  procalcitonin level. Low cortisol level.  Pt is on 60 mg of prednisone.  Patient is currently off all blood pressure medications and Lasix.  Leukocytosis:  Probably from steroids  and Nplate injection CXR does not show obvious pneumonia. She denies any sob or cough.  Urine cultures show less than 10,000 bacteria.     Pressure injury. Present on admission.  Pressure Injury 02/05/21 Sacrum Left;Medial Deep Tissue Pressure Injury - Purple or maroon localized area of discolored intact skin or blood-filled blister due to damage of underlying soft tissue from pressure and/or shear. (Active)  02/05/21 2000  Location: Sacrum  Location Orientation: Left;Medial  Staging: Deep Tissue Pressure Injury - Purple or maroon localized area of discolored intact skin or blood-filled blister due to damage of underlying soft tissue from pressure and/or shear.  Wound Description (Comments):   Present on Admission: Yes  Resume wound care.      DVT prophylaxis: scd's Code Status: full code.  Family Communication: None at bedside Disposition:   Status is: Inpatient  Remains inpatient appropriate because:Inpatient level of care appropriate due to severity of illness  Dispo: The patient is from: Home              Anticipated d/c is to: Home  Patient currently is not medically stable to d/c.   Difficult to place patient No       Consultants:  Hematology Gi.   Procedures: none.   Antimicrobials: none.    Subjective: Reports feeling weak.   Objective: Vitals:   02/19/21 0825 02/19/21 0830 02/19/21 0910 02/19/21 1022  BP: (!) 88/59 (!) 83/69 (!) 96/52 (!) 92/45  Pulse: (!) 208 73  67  Resp:    17  Temp:    97.6 F (36.4 C)  TempSrc:    Oral  SpO2: 98%   98%  Weight:         Intake/Output Summary (Last 24 hours) at 02/19/2021 1157 Last data filed at 02/19/2021 [id number] Gross per 24 hour  Intake 724.22 ml  Output 750 ml  Net -25.78 ml    Filed Weights   02/18/21 0600 02/19/21 0000 02/19/21 0259  Weight: 77.9 kg 78.4 kg 78.4 kg    Examination:  General exam: Elderly woman , not in distress.on RA.  Respiratory system: AIR ENTRY FAIR, bilateral, no wheezing heard, on RA.  Cardiovascular system: RRR no JVD no pedal edema.  Gastrointestinal system: Abdomen is soft NT ND BS+ Central nervous system: Alert and oriented, non focal.  Extremities: No pedal edema, or cyanosis.  Skin: Sacral deep tissue injury found Psychiatry: Mood is appropriate    Data Reviewed: I have personally reviewed following labs and imaging studies  CBC: Recent Labs  Lab 02/14/21 0227 02/15/21 0236 02/16/21 0315 02/17/21 0211 02/18/21 0411 02/19/21 0441  WBC 16.9* 21.6* 22.8* 22.5* 22.4* 24.2*  NEUTROABS 11.6* 15.5* 15.7* 16.4* 15.9*  --   HGB 12.5 13.3 12.3 11.8* 11.9* 12.3  HCT 37.1 38.4 36.9 35.5* 36.4 38.0  MCV 97.4 96.2 98.1 97.3 100.0 100.8*  PLT 25* 26* 17* 15* 33* 36*     Basic Metabolic Panel: Recent Labs  Lab 02/16/21 0315  NA 131*  K 3.6  CL 101  CO2 24  GLUCOSE 106*  BUN 19  CREATININE 0.85  CALCIUM 8.8*     GFR: CrCl cannot be calculated (Unknown ideal weight.).  Liver Function Tests: No results for input(s): AST, ALT, ALKPHOS, BILITOT, PROT, ALBUMIN in the last 168 hours.   CBG: No results for input(s): GLUCAP in the last 168 hours.    Recent Results (from the past 240 hour(s))  Urine Culture     Status: Abnormal   Collection Time: 02/15/21  2:34 PM   Specimen: Urine, Clean Catch  Result Value Ref Range Status   Specimen Description URINE, CLEAN CATCH  Final   Special Requests Normal  Final   Culture (A)  Final    <10,000 COLONIES/mL INSIGNIFICANT GROWTH Performed at [facility], [address], [place], [place] [address]     Report Status 02/16/2021 FINAL  Final           Radiology Studies: No results found.      Scheduled Meds:  sodium chloride   Intravenous Once   sodium chloride   Intravenous Once   atorvastatin  20 mg Oral Daily   chlorhexidine  15 mL Mouth Rinse BID   escitalopram  20 mg Oral Daily   fluticasone  2 spray Each Nare Daily   guaiFENesin  1,200 mg Oral BID   levothyroxine  25 mcg Oral q morning   lidocaine  1 patch Transdermal Q24H   mouth rinse  15 mL Mouth Rinse q12n4p   midodrine  10 mg Oral TID WC  mometasone-formoterol  2 puff Inhalation BID   pantoprazole  40 mg Oral BID   predniSONE  60 mg Oral Q breakfast   QUEtiapine  100 mg Oral QHS   romiPLOStim  10 mcg/kg Subcutaneous Weekly   rOPINIRole  0.5 mg Oral TID   Continuous Infusions:  sodium chloride     sodium chloride     sodium chloride     sodium chloride     sodium chloride       LOS: 14 days        [name], MD [facility]   To contact the attending provider between 7A-7P or the covering provider during after hours 7P-7A, please log into the web site [email or web address] and access using universal [place] password for that web site. If you do not have the password, please call the hospital operator.  02/19/2021, 11:57 AM

## 2021-02-19 NOTE — Progress Notes (Signed)
RN started frequent Q30 vitals on patient after initial shift SBP was low 80s prior to given scheduled '5mg'$  midodrine.  Patient SBP has been trending low to mid 80s since then. Patient is asymptomatic. Paged and Shelia Media MD. MD placed new orders for additional '5mg'$  of midodrine to be given. Will continue to monitor patient for any changes.

## 2021-02-19 NOTE — Progress Notes (Signed)
Physical Therapy Treatment Patient Details Name: Jean Kramer MRN: [id number] DOB: Mar 02, 1951 Today's Date: 02/19/2021    History of Present Illness 70 y/o female presents to [facility] ED on 02/05/2021 with generalized weakness, black stools and SOB. Hgb of 4, platelets less than 5000 upon admission. 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    PT Comments    Pt reports feeling fatigued and weak, but agreeable to OOB mobility. Pt ambulatory in room with use of RW today due to feeling unsteady, overall pt requiring close guard only and no physical assist from PT required. Pt tolerated repeated sit<>stands well, eventually limited by dyspnea with sats 96% on RA and fatigue. No orthostatic drop in BP during mobility, see below. PT to continue to follow acutely.   BP supine: 97/59 (71) BP sitting: 92/60 (71) BP sitting post-gait and sit<>stands: 103/86 (93)     Follow Up Recommendations  Home health PT     Equipment Recommendations  None recommended by PT    Recommendations for Other Services       Precautions / Restrictions Precautions Precautions: Fall Precaution Comments: monitor Hgb, platelets, BP Restrictions Weight Bearing Restrictions: No    Mobility  Bed Mobility Overal bed mobility: Needs Assistance Bed Mobility: Supine to Sit;Sit to Supine     Supine to sit: Supervision;HOB elevated Sit to supine: Supervision;HOB elevated   General bed mobility comments: for safety, HOB elevated and use of bedrails    Transfers Overall transfer level: Needs assistance Equipment used: Rolling walker (2 wheeled) Transfers: Sit to/from Stand Sit to Stand: Min guard         General transfer comment: for safety, slow to rise and steady.  Ambulation/Gait Ambulation/Gait assistance: Supervision Gait Distance (Feet): 60 Feet Assistive device: Rolling walker (2 wheeled) Gait Pattern/deviations: Step-through pattern;Decreased stride length;Trunk  flexed Gait velocity: decr   General Gait Details: close guard for safety, slowed and labored gait with use of RW today due to pt fatigue and unsteadiness   Stairs             Wheelchair Mobility    Modified Rankin (Stroke Patients Only)       Balance Overall balance assessment: Needs assistance Sitting-balance support: No upper extremity supported;Feet supported Sitting balance-Leahy Scale: Good     Standing balance support: Bilateral upper extremity supported Standing balance-Leahy Scale: Fair Standing balance comment: able to stand statically without AD, use of RW for gait                            Cognition Arousal/Alertness: Awake/alert Behavior During Therapy: WFL for tasks assessed/performed Overall Cognitive Status: Within Functional Limits for tasks assessed                                        Exercises Other Exercises Other Exercises: sit<>stands x10, without AD, pt with use of hands to boost self off of bed    General Comments General comments (skin integrity, edema, etc.): BP stable or increasing with activity (see clinical impression), DOE 2/4 with secretion-laden breath sounds, nonproductive coughs during session. SPO2 96% on RA      Pertinent Vitals/Pain Pain Assessment: Faces Faces Pain Scale: No hurt Pain Intervention(s): Monitored during session    Home Living  Prior Function            PT Goals (current goals can now be found in the care plan section) Acute Rehab PT Goals Patient Stated Goal: home PT Goal Formulation: With patient Time For Goal Achievement: 02/28/21 Potential to Achieve Goals: Good Additional Goals Additional Goal #1: Pt will score >19/24 on DGI to indicate a reduced risk for falls Progress towards PT goals: Progressing toward goals    Frequency    Min 3X/week      PT Plan Current plan remains appropriate    Co-evaluation               AM-PAC PT "6 Clicks" Mobility   Outcome Measure  Help needed turning from your back to your side while in a flat bed without using bedrails?: None Help needed moving from lying on your back to sitting on the side of a flat bed without using bedrails?: A Little Help needed moving to and from a bed to a chair (including a wheelchair)?: A Little Help needed standing up from a chair using your arms (e.g., wheelchair or bedside chair)?: A Little Help needed to walk in hospital room?: A Little Help needed climbing 3-5 steps with a railing? : A Little 6 Click Score: 19    End of Session   Activity Tolerance: Patient tolerated treatment well Patient left: in bed;with call bell/phone within reach;with bed alarm set Nurse Communication: Mobility status PT Visit Diagnosis: Unsteadiness on feet (R26.81);History of falling (Z91.81)     Time: [id number] PT Time Calculation (min) (ACUTE ONLY): 15 min  Charges:  $Gait Training: 8-22 mins                    Stacie Glaze, PT DPT Acute Rehabilitation Services Pager [phone]  Office [phone]    [place] 02/19/2021, 12:06 PM

## 2021-02-19 NOTE — Plan of Care (Signed)
  Problem: Health Behavior/Discharge Planning: Goal: Ability to manage health-related needs will improve Outcome: Progressing   Problem: Clinical Measurements: Goal: Ability to maintain clinical measurements within normal limits will improve Outcome: Progressing Goal: Cardiovascular complication will be avoided Outcome: Progressing   

## 2021-02-20 LAB — BASIC METABOLIC PANEL
Anion gap: 8 (ref 5–15)
BUN: 18 mg/dL (ref 8–23)
CO2: 28 mmol/L (ref 22–32)
Calcium: 9.2 mg/dL (ref 8.9–10.3)
Chloride: 96 mmol/L — ABNORMAL LOW (ref 98–111)
Creatinine, Ser: 0.81 mg/dL (ref 0.44–1.00)
GFR, Estimated: >60 mL/min (ref >60)
Glucose, Bld: 112 mg/dL — ABNORMAL HIGH (ref 70–99)
Potassium: 4.7 mmol/L (ref 3.5–5.1)
Sodium: 132 mmol/L — ABNORMAL LOW (ref 135–145)

## 2021-02-20 LAB — CBC
HCT: 39.3 % (ref 36.0–46.0)
Hemoglobin: 12.6 g/dL (ref 12.0–15.0)
MCH: 32.5 pg (ref 26.0–34.0)
MCHC: 32.1 g/dL (ref 30.0–36.0)
MCV: 101.3 fL — ABNORMAL HIGH (ref 80.0–100.0)
Platelets: 63 10*3/uL — ABNORMAL LOW (ref 150–400)
RBC: 3.88 MIL/uL (ref 3.87–5.11)
RDW: 17.2 % — ABNORMAL HIGH (ref 11.5–15.5)
WBC: 26.9 10*3/uL — ABNORMAL HIGH (ref 4.0–10.5)
nRBC: 0 % (ref 0.0–0.2)

## 2021-02-20 MED ORDER — ASPIRIN 81 MG PO TBEC
81.0000 mg | DELAYED_RELEASE_TABLET | Freq: Every day | ORAL | 12 refills | Status: DC
Start: 2021-03-06 — End: 2021-03-09

## 2021-02-20 MED ORDER — PREDNISONE 20 MG PO TABS: ORAL_TABLET | ORAL | 0 refills | Status: DC

## 2021-02-20 MED ORDER — PANTOPRAZOLE SODIUM 40 MG PO TBEC: 40.0000 mg | DELAYED_RELEASE_TABLET | Freq: Two times a day (BID) | ORAL | 2 refills | Status: DC

## 2021-02-20 MED ORDER — MIDODRINE HCL 10 MG PO TABS: 10.0000 mg | ORAL_TABLET | Freq: Three times a day (TID) | ORAL | 0 refills | Status: AC

## 2021-02-20 MED ORDER — ATORVASTATIN CALCIUM 20 MG PO TABS: 20.0000 mg | ORAL_TABLET | Freq: Every day | ORAL | 1 refills | Status: DC

## 2021-02-20 NOTE — Care Management Important Message (Signed)
Important Message  Patient Details  Name: Jean Kramer MRN: [id number] Date of Birth: January 31, 1951   Medicare Important Message Given:  Yes     Shelda Altes 02/20/2021, 11:14 AM

## 2021-02-20 NOTE — Progress Notes (Signed)
   02/20/21 1055  Mobility  Activity Refused mobility (Declined for unspecified reasons. Was asleep upon entry)

## 2021-02-20 NOTE — TOC Transition Note (Signed)
Transition of Care [facility]) - CM/SW Discharge Note   Patient Details  Name: Jean Kramer MRN: [id number] Date of Birth: 1950-10-09  [facility] of [facility]) CM/SW Contact:  [name], RN Phone Number: 02/20/2021, 11:39 AM   Clinical Narrative:    Patient is for dc today, NCM notified Stacie with [facility].     Final next level of care: [place] Barriers to Discharge: No Barriers Identified   Patient Goals and CMS Choice Patient states their goals for this hospitalization and ongoing recovery are:: return home with [facility] CMS Medicare.gov Compare Post Acute Care list provided to:: Patient Choice offered to / list presented to : Patient  Discharge Placement                       Discharge Plan and Services   Discharge Planning Services: CM Consult Post Acute Care Choice: Home Health            DME Agency: NA       HH Arranged: PT HH Agency: [place] Date [place]: 02/18/21 Time [place]: 1036 Representative spoke with at [place]: [place] ([place]) Interventions     Readmission Risk Interventions No flowsheet data found.

## 2021-02-23 NOTE — Discharge Summary (Signed)
Physician Discharge Summary  Jean Kramer [id number] DOB: 17-Nov-1950 DOA: 02/05/2021  PCP: [name], MD  Admit date: 02/05/2021 Discharge date: 02/20/2021  Admitted From: Home.  Disposition:  Home.   Recommendations for Outpatient Follow-up:  Follow up with PCP in 1-2 weeks Please obtain BMP/CBC in one week Please follow up with hematology and oncology In one week as recommended   Home Health:yes   Discharge Condition: stable.  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 She is hypotensive on presentation with severe anemia, Hgb of 5, with platelet count of 5. S/p 1 unit of platelet transfusion, and NPlate infusion.  Discharge Diagnoses:  Active Problems:   Acute ITP (HCC)    GI bleed in the setting of thrombocytopenia: Was on pressors transiently, weaned off and currently stable. Hemorrhagic shock has resolved, off pressors on 7/22, transferred to hospitalist service on 7/23 No bleeding so far. GI on board, recommended no EGD until platelets improve.  Hemoglobin stable around 12.      Anemia of blood loss possibly from GI bleed. Hemoglobin improved and stable. Transfuse to keep hemoglobin greater than 7. Appreciate GI recommendations.   ITP: S/P IVIG/steroid/Nplate infusion. Platelet count at 63,000 As per oncology, to monitor her few more days to see the counts improve.  No nausea, vomiting or abdominal pain.   Further recommendations as per oncology. When platelets improve to 50,000 , can be discharged home with outpatient follow up with tapering steroids. Recommend outpatient follow up with hematology and oncology in one week as recommended.      Acute on chronic diastolic CHF. Improved. Pt is on RA and denies any sob. Strict intake and output. Daily weights.         COPD; No wheezing heard,  on RA. Continue to monitor. Current smoker, smoking cessation counseling provided by provider.     Hypothyroidism: Resume synthroid.   Mild hyponatremia: Asymptomatic.      Hypotension;  Intermittent , mostly in the mornings. Started her on midodrine and increased the dose to 10 mg TID.  Patient remains asymptomatic, no dizziness or weakness. CXR is negative for pneumonia and UA is negative for UTI.  Negative  procalcitonin level. Low cortisol level, recommend checking cortisol level as outpatient.   Pt is on 60 mg of prednisone.    Leukocytosis: Probably from steroids  and Nplate injection CXR does not show obvious pneumonia. She denies any sob or cough. Urine cultures show less than 10,000 bacteria.       Pressure injury. Present on admission.  Pressure Injury 02/05/21 Sacrum Left;Medial Deep Tissue Pressure Injury - Purple or maroon localized area of discolored intact skin or blood-filled blister due to damage of underlying soft tissue from pressure and/or shear. (Active)  02/05/21 2000  Location: Sacrum  Location Orientation: Left;Medial  Staging: Deep Tissue Pressure Injury - Purple or maroon localized area of discolored intact skin or blood-filled blister due to damage of underlying soft tissue from pressure and/or shear.  Wound Description (Comments):  Present on Admission: Yes  Resume wound care.      Discharge Instructions  Discharge Instructions     Diet - low sodium heart healthy   Complete by: As directed    Discharge wound care:   Complete by: As directed    APPLY SILICONE FOAM DRESSING   Increase activity slowly   Complete  by: As directed       Allergies as of 02/20/2021   No Known Allergies      Medication List     STOP taking these medications    simvastatin 40 MG tablet Commonly known as: ZOCOR       TAKE these medications    acetaminophen 500 MG tablet Commonly known as: TYLENOL Take 1,000 mg by mouth every 6 (six) hours as  needed for mild pain.   alendronate 70 MG tablet Commonly known as: FOSAMAX Take 70 mg by mouth once a week. Sunday   ALPRAZolam 1 MG tablet Commonly known as: XANAX Take 0.5-1 mg by mouth daily as needed for anxiety.   aspirin 81 MG EC tablet Take 1 tablet (81 mg total) by mouth daily. Start taking on: March 06, 2021 What changed: These instructions start on March 06, 2021. If you are unsure what to do until then, ask your doctor or other care provider.   atorvastatin 20 MG tablet Commonly known as: LIPITOR Take 1 tablet (20 mg total) by mouth daily.   budesonide-formoterol 160-4.5 MCG/ACT inhaler Commonly known as: SYMBICORT Inhale 2 puffs into the lungs 2 (two) times daily as needed (shortness of breath).   busPIRone 15 MG tablet Commonly known as: BUSPAR Take 15 mg by mouth 2 (two) times daily.   calcium-vitamin D 500-200 MG-UNIT Tabs tablet Commonly known as: OSCAL WITH D Take 1 tablet by mouth 2 (two) times daily with a meal.   escitalopram 20 MG tablet Commonly known as: LEXAPRO Take 20 mg by mouth daily.   furosemide 20 MG tablet Commonly known as: LASIX Take 20 mg by mouth daily.   ipratropium-albuterol 0.5-2.5 (3) MG/3ML Soln Commonly known as: DUONEB Inhale 3 mLs into the lungs every 6 (six) hours as needed for shortness of breath.   levothyroxine 25 MCG tablet Commonly known as: SYNTHROID Take 25 mcg by mouth every morning.   Linzess 290 MCG Caps capsule Generic drug: linaclotide Take 290 mcg by mouth daily as needed (constipation).   midodrine 10 MG tablet Commonly known as: PROAMATINE Take 1 tablet (10 mg total) by mouth 3 (three) times daily with meals.   Oxycodone HCl 20 MG Tabs Take 1 tablet by mouth every 8 (eight) hours as needed for severe pain.   pantoprazole 40 MG tablet Commonly known as: PROTONIX Take 1 tablet (40 mg total) by mouth 2 (two) times daily.   pramipexole 0.5 MG tablet Commonly known as: MIRAPEX Take 0.5 mg by mouth  at bedtime.   predniSONE 20 MG tablet Commonly known as: DELTASONE Prednisone 60 mg daily for 1 week followed by  Prednisone 50 mg daily for 1 week followed by  Prednisone 40 mg daily for 1 week followed by  Prednisone 30 mg daily for 1 week followed by  Prednisone 20 mg daily for 1 week followed by  Prednisone 10 mg daily for 1 weeks followed by  Prednisone 5 mg daily for 1 weeks.   QUEtiapine 200 MG tablet Commonly known as: SEROQUEL Take 200 mg by mouth at bedtime.   rOPINIRole 0.5 MG tablet Commonly known as: REQUIP Take 0.5 mg by mouth 3 (three) times daily.               Discharge Care Instructions  (From admission, onward)           Start     Ordered   02/20/21 0000  Discharge wound care:       Comments: APPLY  SILICONE FOAM DRESSING   02/20/21 1039            Follow-up Information     [facility] Follow up.   Specialty: Home Health Services Why: HHPT Contact information: [address] [place] [address] [phone]         [name], MD. Schedule an appointment as soon as possible for a visit in 1 week(s).   Specialty: Oncology Why: Get repeat labs to check platelet count. Contact information: [place]. [place] [address] [phone]         [name], MD. Daphane Shepherd on 02/28/2021.   Specialty: Internal Medicine Why: '@11'$ :[place] information: [address] [place] [address] [phone]                No Known Allergies  Consultations: Oncology. Hematology.    Procedures/Studies: CT Head Wo Contrast  Result Date: 02/05/2021 CLINICAL DATA:  Headache, suspected intracranial hemorrhage EXAM: CT HEAD WITHOUT CONTRAST TECHNIQUE: Contiguous axial images were obtained from the base of the skull through the vertex without intravenous contrast. COMPARISON:  03/31/2006 FINDINGS: Brain: No evidence of acute infarction, hemorrhage, hydrocephalus, extra-axial  collection or mass lesion/mass effect. Mild atrophy. Vascular: Atherosclerotic and physiologic intracranial calcifications. Skull: Normal. Negative for fracture or focal lesion. Sinuses/Orbits: Dense opacification of the left maxillary sinus and partial opacification of left ethmoid air cells. Orbits unremarkable. Other: 2cm parietal scalp lesion near the midline and1 cm right parietooccipital scalp lesion, possibly sebaceous cyst but nonspecific. IMPRESSION: 1. No acute intracranial process. 2. Left maxillary and ethmoid sinus disease Electronically Signed   By: Lucrezia Europe M.D.   On: 02/05/2021 14:51   DG CHEST PORT 1 VIEW  Result Date: 02/15/2021 CLINICAL DATA:  Weakness. EXAM: PORTABLE CHEST 1 VIEW COMPARISON:  Chest radiograph 02/09/2021. FINDINGS: Patient is mildly rotated. Normal cardiac and mediastinal contours. No large area of pulmonary consolidation. No pleural effusion or pneumothorax. IMPRESSION: No acute cardiopulmonary process. Electronically Signed   By: Lovey Newcomer M.D.   On: 02/15/2021 15:38   DG CHEST PORT 1 VIEW  Result Date: 02/09/2021 CLINICAL DATA:  Wheezing.  History of COPD. EXAM: PORTABLE CHEST 1 VIEW COMPARISON:  02/08/2021 FINDINGS: Cardiac silhouette is normal in size. No mediastinal or hilar masses. There is hazy opacity at the right lung base partly silhouetting the lateral right hemidiaphragm. Remainder of the lungs is clear. Possible right pleural effusion. No convincing left pleural effusion. No pneumothorax. Skeletal structures are grossly intact. IMPRESSION: 1. No definite change from the previous day's study allowing for differences in patient positioning and technique. 2. Opacity at the right lung base is likely a combination of a small effusion and atelectasis. Pneumonia should be considered if there are consistent clinical findings. 3. No evidence of pulmonary edema. Electronically Signed   By: Lajean Manes M.D.   On: 02/09/2021 16:00   DG CHEST PORT 1 VIEW  Result  Date: 02/08/2021 CLINICAL DATA:  Cough, weakness. EXAM: PORTABLE CHEST 1 VIEW COMPARISON:  Chest radiograph dated 09/20/2007. FINDINGS: The heart size is normal. Vascular calcifications are seen in the aortic arch. There are mild bilateral lower lung predominant interstitial and airspace opacities. A small right pleural effusion may contribute. There is no left pleural effusion. There is no pneumothorax on either side. No acute osseous injury. IMPRESSION: Mild bilateral lower lung predominant interstitial and airspace opacities may represent pulmonary edema and/or pneumonia. A small right pleural effusion may contribute. Electronically Signed  By: Zerita Boers M.D.   On: 02/08/2021 17:34      Subjective: No new complaints.   Discharge Exam: Vitals:   02/20/21 0800 02/20/21 0850  BP: 92/66   Pulse: 65   Resp:    Temp:    SpO2: 98% 97%   Vitals:   02/19/21 2200 02/20/21 0344 02/20/21 0800 02/20/21 0850  BP: 114/69 (!) 114/59 92/66   Pulse: 80 80 65   Resp:  20    Temp:  98 F (36.7 C)    TempSrc:  Oral    SpO2:  95% 98% 97%  Weight:  79.1 kg      General: Pt is alert, awake, not in acute distress Cardiovascular: RRR, S1/S2 +, no rubs, no gallops Respiratory: CTA bilaterally, no wheezing, no rhonchi Abdominal: Soft, NT, ND, bowel sounds + Extremities: no edema, no cyanosis    The results of significant diagnostics from this hospitalization (including imaging, microbiology, ancillary and laboratory) are listed below for reference.     Microbiology: Recent Results (from the past 240 hour(s))  Urine Culture     Status: Abnormal   Collection Time: 02/15/21  2:34 PM   Specimen: Urine, Clean Catch  Result Value Ref Range Status   Specimen Description URINE, CLEAN CATCH  Final   Special Requests Normal  Final   Culture (A)  Final    <10,000 COLONIES/mL INSIGNIFICANT GROWTH Performed at [facility], [address], [place], [place] [address]    Report Status  02/16/2021 FINAL  Final     Labs: BNP (last 3 results) No results for input(s): BNP in the last 8760 hours. Basic Metabolic Panel: Recent Labs  Lab 02/20/21 0339  NA 132*  K 4.7  CL 96*  CO2 28  GLUCOSE 112*  BUN 18  CREATININE 0.81  CALCIUM 9.2   Liver Function Tests: No results for input(s): AST, ALT, ALKPHOS, BILITOT, PROT, ALBUMIN in the last 168 hours. No results for input(s): LIPASE, AMYLASE in the last 168 hours. No results for input(s): AMMONIA in the last 168 hours. CBC: Recent Labs  Lab 02/17/21 0211 02/18/21 0411 02/19/21 0441 02/20/21 0339  WBC 22.5* 22.4* 24.2* 26.9*  NEUTROABS 16.4* 15.9*  --   --   HGB 11.8* 11.9* 12.3 12.6  HCT 35.5* 36.4 38.0 39.3  MCV 97.3 100.0 100.8* 101.3*  PLT 15* 33* 36* 63*   Cardiac Enzymes: No results for input(s): CKTOTAL, CKMB, CKMBINDEX, TROPONINI in the last 168 hours. BNP: Invalid input(s): POCBNP CBG: No results for input(s): GLUCAP in the last 168 hours. D-Dimer No results for input(s): DDIMER in the last 72 hours. Hgb A1c No results for input(s): HGBA1C in the last 72 hours. Lipid Profile No results for input(s): CHOL, HDL, LDLCALC, TRIG, CHOLHDL, LDLDIRECT in the last 72 hours. Thyroid function studies No results for input(s): TSH, T4TOTAL, T3FREE, THYROIDAB in the last 72 hours.  Invalid input(s): FREET3 Anemia work up No results for input(s): VITAMINB12, FOLATE, FERRITIN, TIBC, IRON, RETICCTPCT in the last 72 hours. Urinalysis    Component Value Date/Time   COLORURINE YELLOW 02/15/2021 1434   APPEARANCEUR HAZY (A) 02/15/2021 1434   LABSPEC 1.028 02/15/2021 1434   PHURINE 5.0 02/15/2021 1434   GLUCOSEU NEGATIVE 02/15/2021 1434   HGBUR NEGATIVE 02/15/2021 [place] 02/15/2021 1434   KETONESUR NEGATIVE 02/15/2021 1434   PROTEINUR NEGATIVE 02/15/2021 1434   NITRITE NEGATIVE 02/15/2021 1434   LEUKOCYTESUR LARGE (A) 02/15/2021 1434   Sepsis Labs Invalid  input(s): PROCALCITONIN,   WBC,  LACTICIDVEN Microbiology Recent Results (from the past 240 hour(s))  Urine Culture     Status: Abnormal   Collection Time: 02/15/21  2:34 PM   Specimen: Urine, Clean Catch  Result Value Ref Range Status   Specimen Description URINE, CLEAN CATCH  Final   Special Requests Normal  Final   Culture (A)  Final    <10,000 COLONIES/mL INSIGNIFICANT GROWTH Performed at [facility], [address], [place], [place] [address]    Report Status 02/16/2021 FINAL  Final     Time coordinating discharge: 38 minutes.   SIGNED:   [name], MD  [facility]

## 2021-02-26 ENCOUNTER — Other Ambulatory Visit: Payer: Self-pay | Admitting: Oncology

## 2021-02-26 ENCOUNTER — Encounter: Payer: Self-pay | Admitting: Oncology

## 2021-02-26 ENCOUNTER — Telehealth: Payer: Self-pay | Admitting: Oncology

## 2021-02-26 ENCOUNTER — Inpatient Hospital Stay: Payer: Medicare Other | Attending: Oncology | Admitting: Oncology

## 2021-02-26 ENCOUNTER — Other Ambulatory Visit: Payer: Self-pay

## 2021-02-26 ENCOUNTER — Inpatient Hospital Stay: Payer: Medicare Other

## 2021-02-26 DIAGNOSIS — D72829 Elevated white blood cell count, unspecified: Secondary | ICD-10-CM | POA: Diagnosis not present

## 2021-02-26 DIAGNOSIS — D693 Immune thrombocytopenic purpura: Secondary | ICD-10-CM

## 2021-02-26 DIAGNOSIS — Z9081 Acquired absence of spleen: Secondary | ICD-10-CM

## 2021-02-26 DIAGNOSIS — Z1231 Encounter for screening mammogram for malignant neoplasm of breast: Secondary | ICD-10-CM

## 2021-02-26 DIAGNOSIS — Z1239 Encounter for other screening for malignant neoplasm of breast: Secondary | ICD-10-CM

## 2021-02-26 LAB — CBC AND DIFFERENTIAL
HCT: 43 (ref 36–46)
Hemoglobin: 13.7 (ref 12.0–16.0)
Neutrophils Absolute: 10.08
Platelets: 168 (ref 150–399)
WBC: 14

## 2021-02-26 LAB — BASIC METABOLIC PANEL
BUN: 19 (ref 4–21)
CO2: 27 — AB (ref 13–22)
Chloride: 102 (ref 99–108)
Creatinine: 0.9 (ref 0.5–1.1)
Glucose: 120
Potassium: 4 (ref 3.4–5.3)
Sodium: 137 (ref 137–147)

## 2021-02-26 LAB — COMPREHENSIVE METABOLIC PANEL
Albumin: 3.6 (ref 3.5–5.0)
Calcium: 9.4 (ref 8.7–10.7)

## 2021-02-26 LAB — HEPATIC FUNCTION PANEL
ALT: 58 — AB (ref 7–35)
AST: 52 — AB (ref 13–35)
Alkaline Phosphatase: 132 — AB (ref 25–125)
Bilirubin, Total: 0.3

## 2021-02-26 LAB — CBC: RBC: 4.27 (ref 3.87–5.11)

## 2021-02-26 NOTE — Telephone Encounter (Signed)
Per 8/10 LOS, patient scheduled for Sept Appt's.  Gave patient Appt Summary

## 2021-03-05 ENCOUNTER — Encounter (HOSPITAL_COMMUNITY): Payer: Self-pay

## 2021-03-05 ENCOUNTER — Inpatient Hospital Stay (HOSPITAL_COMMUNITY)
Admission: EM | Admit: 2021-03-05 | Discharge: 2021-03-09 | DRG: 813 | Disposition: A | Discharge status: Home / Self Care | Payer: Medicare Other | Attending: Internal Medicine | Admitting: Internal Medicine

## 2021-03-05 DIAGNOSIS — Z79890 Hormone replacement therapy (postmenopausal): Secondary | ICD-10-CM

## 2021-03-05 DIAGNOSIS — E669 Obesity, unspecified: Secondary | ICD-10-CM | POA: Diagnosis present

## 2021-03-05 DIAGNOSIS — Z9081 Acquired absence of spleen: Secondary | ICD-10-CM | POA: Diagnosis not present

## 2021-03-05 DIAGNOSIS — Z20822 Contact with and (suspected) exposure to covid-19: Secondary | ICD-10-CM | POA: Diagnosis present

## 2021-03-05 DIAGNOSIS — Z6834 Body mass index (BMI) 34.0-34.9, adult: Secondary | ICD-10-CM

## 2021-03-05 DIAGNOSIS — F17210 Nicotine dependence, cigarettes, uncomplicated: Secondary | ICD-10-CM | POA: Diagnosis present

## 2021-03-05 DIAGNOSIS — D696 Thrombocytopenia, unspecified: Secondary | ICD-10-CM

## 2021-03-05 DIAGNOSIS — J449 Chronic obstructive pulmonary disease, unspecified: Secondary | ICD-10-CM | POA: Diagnosis present

## 2021-03-05 DIAGNOSIS — Z8673 Personal history of transient ischemic attack (TIA), and cerebral infarction without residual deficits: Secondary | ICD-10-CM | POA: Diagnosis not present

## 2021-03-05 DIAGNOSIS — E039 Hypothyroidism, unspecified: Secondary | ICD-10-CM | POA: Diagnosis present

## 2021-03-05 DIAGNOSIS — Z79899 Other long term (current) drug therapy: Secondary | ICD-10-CM

## 2021-03-05 DIAGNOSIS — D693 Immune thrombocytopenic purpura: Secondary | ICD-10-CM | POA: Diagnosis present

## 2021-03-05 DIAGNOSIS — Z7982 Long term (current) use of aspirin: Secondary | ICD-10-CM

## 2021-03-05 DIAGNOSIS — G8929 Other chronic pain: Secondary | ICD-10-CM | POA: Diagnosis present

## 2021-03-05 DIAGNOSIS — Z7983 Long term (current) use of bisphosphonates: Secondary | ICD-10-CM

## 2021-03-05 DIAGNOSIS — K921 Melena: Secondary | ICD-10-CM | POA: Diagnosis not present

## 2021-03-05 DIAGNOSIS — K922 Gastrointestinal hemorrhage, unspecified: Secondary | ICD-10-CM

## 2021-03-05 DIAGNOSIS — Z7951 Long term (current) use of inhaled steroids: Secondary | ICD-10-CM | POA: Diagnosis not present

## 2021-03-05 DIAGNOSIS — I5032 Chronic diastolic (congestive) heart failure: Secondary | ICD-10-CM | POA: Diagnosis present

## 2021-03-05 DIAGNOSIS — I509 Heart failure, unspecified: Secondary | ICD-10-CM | POA: Diagnosis present

## 2021-03-05 LAB — CBC WITH DIFFERENTIAL/PLATELET
Abs Immature Granulocytes: 0.08 10*3/uL — ABNORMAL HIGH (ref 0.00–0.07)
Basophils Absolute: 0.1 10*3/uL (ref 0.0–0.1)
Basophils Relative: 1 %
Eosinophils Absolute: 0.2 10*3/uL (ref 0.0–0.5)
Eosinophils Relative: 2 %
HCT: 39.8 % (ref 36.0–46.0)
Hemoglobin: 13 g/dL (ref 12.0–15.0)
Immature Granulocytes: 1 %
Lymphocytes Relative: 29 %
Lymphs Abs: 2.8 10*3/uL (ref 0.7–4.0)
MCH: 32.6 pg (ref 26.0–34.0)
MCHC: 32.7 g/dL (ref 30.0–36.0)
MCV: 99.7 fL (ref 80.0–100.0)
Monocytes Absolute: 0.8 10*3/uL (ref 0.1–1.0)
Monocytes Relative: 9 %
Neutro Abs: 5.5 10*3/uL (ref 1.7–7.7)
Neutrophils Relative %: 58 %
Platelets: <5 10*3/uL — CL (ref 150–400)
RBC: 3.99 MIL/uL (ref 3.87–5.11)
RDW: 16 % — ABNORMAL HIGH (ref 11.5–15.5)
WBC: 9.5 10*3/uL (ref 4.0–10.5)
nRBC: 0 % (ref 0.0–0.2)

## 2021-03-05 LAB — BASIC METABOLIC PANEL
Anion gap: 9 (ref 5–15)
BUN: 15 mg/dL (ref 8–23)
CO2: 22 mmol/L (ref 22–32)
Calcium: 9.7 mg/dL (ref 8.9–10.3)
Chloride: 104 mmol/L (ref 98–111)
Creatinine, Ser: 0.81 mg/dL (ref 0.44–1.00)
GFR, Estimated: >60 mL/min (ref >60)
Glucose, Bld: 103 mg/dL — ABNORMAL HIGH (ref 70–99)
Potassium: 4.5 mmol/L (ref 3.5–5.1)
Sodium: 135 mmol/L (ref 135–145)

## 2021-03-05 LAB — CBC
HCT: 39.3 % (ref 36.0–46.0)
Hemoglobin: 12.6 g/dL (ref 12.0–15.0)
MCH: 32.6 pg (ref 26.0–34.0)
MCHC: 32.1 g/dL (ref 30.0–36.0)
MCV: 101.6 fL — ABNORMAL HIGH (ref 80.0–100.0)
Platelets: <5 10*3/uL — CL (ref 150–400)
RBC: 3.87 MIL/uL (ref 3.87–5.11)
RDW: 16.2 % — ABNORMAL HIGH (ref 11.5–15.5)
WBC: 19.9 10*3/uL — ABNORMAL HIGH (ref 4.0–10.5)
nRBC: 0 % (ref 0.0–0.2)

## 2021-03-05 LAB — HEPATIC FUNCTION PANEL
ALT: 63 U/L — ABNORMAL HIGH (ref 0–44)
AST: 82 U/L — ABNORMAL HIGH (ref 15–41)
Albumin: 3.1 g/dL — ABNORMAL LOW (ref 3.5–5.0)
Alkaline Phosphatase: 76 U/L (ref 38–126)
Bilirubin, Direct: 0.1 mg/dL (ref 0.0–0.2)
Indirect Bilirubin: 0.5 mg/dL (ref 0.3–0.9)
Total Bilirubin: 0.6 mg/dL (ref 0.3–1.2)
Total Protein: 7.4 g/dL (ref 6.5–8.1)

## 2021-03-05 LAB — TYPE AND SCREEN
ABO/RH(D): O POS
Antibody Screen: NEGATIVE

## 2021-03-05 LAB — RESP PANEL BY RT-PCR (FLU A&B, COVID) ARPGX2
Influenza A by PCR: NEGATIVE
Influenza B by PCR: NEGATIVE
SARS Coronavirus 2 by RT PCR: NEGATIVE

## 2021-03-05 MED ORDER — ROMIPLOSTIM INJECTION 500 MCG
10.0000 ug/kg | Freq: Once | SUBCUTANEOUS | Status: AC
  Administered 2021-03-05: 790 ug via SUBCUTANEOUS
  Filled 2021-03-05: qty 1.58

## 2021-03-05 MED ORDER — ALPRAZOLAM 0.25 MG PO TABS
0.5000 mg | ORAL_TABLET | Freq: Every day | ORAL | Status: DC | PRN
  Administered 2021-03-06: 0.5 mg via ORAL
  Filled 2021-03-05: qty 2

## 2021-03-05 MED ORDER — PRAMIPEXOLE DIHYDROCHLORIDE 0.25 MG PO TABS
0.5000 mg | ORAL_TABLET | Freq: Every day | ORAL | Status: DC
  Administered 2021-03-06 – 2021-03-08 (×4): 0.5 mg via ORAL
  Filled 2021-03-05 (×5): qty 2

## 2021-03-05 MED ORDER — FUROSEMIDE 20 MG PO TABS
20.0000 mg | ORAL_TABLET | Freq: Every day | ORAL | Status: DC
  Administered 2021-03-06 – 2021-03-09 (×4): 20 mg via ORAL
  Filled 2021-03-05 (×4): qty 1

## 2021-03-05 MED ORDER — MORPHINE SULFATE (PF) 2 MG/ML IV SOLN
2.0000 mg | INTRAVENOUS | Status: DC | PRN
Start: 2021-03-05 — End: 2021-03-06
  Administered 2021-03-05 – 2021-03-06 (×3): 2 mg via INTRAVENOUS
  Filled 2021-03-05 (×3): qty 1

## 2021-03-05 MED ORDER — ONDANSETRON HCL 4 MG PO TABS: 4.0000 mg | ORAL_TABLET | Freq: Four times a day (QID) | ORAL | Status: DC | PRN

## 2021-03-05 MED ORDER — ACETAMINOPHEN 500 MG PO TABS
1000.0000 mg | ORAL_TABLET | Freq: Four times a day (QID) | ORAL | Status: DC | PRN
  Administered 2021-03-05 – 2021-03-09 (×5): 1000 mg via ORAL
  Filled 2021-03-05 (×5): qty 2

## 2021-03-05 MED ORDER — ROPINIROLE HCL 1 MG PO TABS
0.5000 mg | ORAL_TABLET | Freq: Three times a day (TID) | ORAL | Status: DC
  Administered 2021-03-06 – 2021-03-09 (×11): 0.5 mg via ORAL
  Filled 2021-03-05 (×12): qty 1

## 2021-03-05 MED ORDER — ONDANSETRON HCL 4 MG/2ML IJ SOLN: 4.0000 mg | Freq: Four times a day (QID) | INTRAMUSCULAR | Status: DC | PRN

## 2021-03-05 MED ORDER — LEVOTHYROXINE SODIUM 25 MCG PO TABS
25.0000 ug | ORAL_TABLET | Freq: Every day | ORAL | Status: DC
  Administered 2021-03-06 – 2021-03-09 (×4): 25 ug via ORAL
  Filled 2021-03-05 (×4): qty 1

## 2021-03-05 MED ORDER — SODIUM CHLORIDE 0.9 % IV SOLN
10.0000 mL/h | Freq: Once | INTRAVENOUS | Status: AC
  Administered 2021-03-05: 10 mL/h via INTRAVENOUS

## 2021-03-05 MED ORDER — MOMETASONE FURO-FORMOTEROL FUM 200-5 MCG/ACT IN AERO
2.0000 | INHALATION_SPRAY | Freq: Two times a day (BID) | RESPIRATORY_TRACT | Status: DC
  Administered 2021-03-06 – 2021-03-09 (×7): 2 via RESPIRATORY_TRACT
  Filled 2021-03-05: qty 8.8

## 2021-03-05 MED ORDER — IPRATROPIUM-ALBUTEROL 0.5-2.5 (3) MG/3ML IN SOLN: 3.0000 mL | Freq: Four times a day (QID) | RESPIRATORY_TRACT | Status: DC | PRN

## 2021-03-05 MED ORDER — QUETIAPINE FUMARATE 100 MG PO TABS
200.0000 mg | ORAL_TABLET | Freq: Every day | ORAL | Status: DC
  Administered 2021-03-06 – 2021-03-08 (×4): 200 mg via ORAL
  Filled 2021-03-05: qty 2
  Filled 2021-03-05: qty 1
  Filled 2021-03-05 (×2): qty 2
  Filled 2021-03-05: qty 1

## 2021-03-05 MED ORDER — METHYLPREDNISOLONE SODIUM SUCC 125 MG IJ SOLR
125.0000 mg | Freq: Once | INTRAMUSCULAR | Status: AC
  Administered 2021-03-05: 125 mg via INTRAVENOUS
  Filled 2021-03-05: qty 2

## 2021-03-05 MED ORDER — SODIUM CHLORIDE 0.9 % IV SOLN: 250.0000 mL | INTRAVENOUS | Status: DC | PRN

## 2021-03-05 MED ORDER — SODIUM CHLORIDE 0.9% FLUSH: 3.0000 mL | INTRAVENOUS | Status: DC | PRN

## 2021-03-05 MED ORDER — SODIUM CHLORIDE 0.9% FLUSH
3.0000 mL | Freq: Two times a day (BID) | INTRAVENOUS | Status: DC
  Administered 2021-03-06 – 2021-03-09 (×8): 3 mL via INTRAVENOUS

## 2021-03-05 MED ORDER — DOCUSATE SODIUM 100 MG PO CAPS
100.0000 mg | ORAL_CAPSULE | Freq: Two times a day (BID) | ORAL | Status: DC
  Administered 2021-03-06 – 2021-03-09 (×8): 100 mg via ORAL
  Filled 2021-03-05 (×8): qty 1

## 2021-03-05 MED ORDER — PANTOPRAZOLE SODIUM 40 MG PO TBEC
40.0000 mg | DELAYED_RELEASE_TABLET | Freq: Two times a day (BID) | ORAL | Status: DC
  Administered 2021-03-06 – 2021-03-09 (×8): 40 mg via ORAL
  Filled 2021-03-05 (×8): qty 1

## 2021-03-05 MED ORDER — LINACLOTIDE 145 MCG PO CAPS
290.0000 ug | ORAL_CAPSULE | Freq: Every day | ORAL | Status: DC | PRN
  Administered 2021-03-09: 290 ug via ORAL
  Filled 2021-03-05: qty 2

## 2021-03-05 MED ORDER — MIDODRINE HCL 5 MG PO TABS
10.0000 mg | ORAL_TABLET | Freq: Three times a day (TID) | ORAL | Status: DC
  Administered 2021-03-06 – 2021-03-09 (×10): 10 mg via ORAL
  Filled 2021-03-05 (×10): qty 2

## 2021-03-05 MED ORDER — BUSPIRONE HCL 5 MG PO TABS
15.0000 mg | ORAL_TABLET | Freq: Two times a day (BID) | ORAL | Status: DC
  Administered 2021-03-06 – 2021-03-09 (×8): 15 mg via ORAL
  Filled 2021-03-05: qty 3
  Filled 2021-03-05 (×2): qty 2
  Filled 2021-03-05 (×5): qty 3

## 2021-03-05 NOTE — ED Provider Notes (Signed)
[facility] EMERGENCY DEPARTMENT Provider Note   CSN: [id number] Arrival date & time: 03/05/21  1349     History Chief Complaint  Patient presents with   Fall   Melena    Jean Kramer is a 70 y.o. female who presents the emergency department with uncontrolled bleeding.  The patient was recently admitted to the hospital with GI bleed and thrombocytopenia.  She had hemorrhagic shock that required multiple units of blood, platelets and plasma.  The patient is status post splenectomy.  She states that she has been feeling worse over the past couple days.  Today she had a black, tarry stool.  She had a scab on her leg which was almost off that she picked and has had uncontrolled bleeding since that time.  She denies any abdominal pain.  She did not fall.  She states that she saw her primary care doctor and another specialist 1 week ago and 6 days ago and both did her blood work and said that her thrombocytes were within normal range.  Since she has been to the emergency department she developed a very large hematoma of her right upper extremity after getting her blood pressure cuff placed and has developed petechiae up and down both of her extremities within the last hour.  Is also noticed bleeding from her mouth.   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 No pertinent surgical history.   OB History   No obstetric history on file.     No family history on file.  Social  History   Tobacco Use   Smoking status: Every Day    Types: Cigarettes   Smokeless tobacco: Never    Home Medications Prior to Admission medications   Medication Sig Start Date End Date Taking? Authorizing Provider  acetaminophen (TYLENOL) 500 MG tablet Take 1,000 mg by mouth every 6 (six) hours as needed for mild pain.    [provider]  alendronate (FOSAMAX) 70 MG tablet Take 70 mg by mouth once a week. Sunday 12/06/20   [provider]  ALPRAZolam Duanne Moron) 1 MG tablet Take 0.5-1 mg by mouth daily as needed for anxiety. Only gets 10 pills a month 01/16/21   [provider]  aspirin 81 MG EC tablet Take 1 tablet (81 mg total) by mouth daily. 03/06/21   [name], MD  atorvastatin (LIPITOR) 20 MG tablet Take 1 tablet (20 mg total) by mouth daily. 02/21/21   [name], MD  budesonide-formoterol (SYMBICORT) 160-4.5 MCG/ACT inhaler Inhale 2 puffs into the lungs 2 (two) times daily as needed (shortness of breath).    [provider]  busPIRone (BUSPAR) 15 MG tablet Take 15 mg by mouth 2 (two) times daily. 12/03/20   [provider]  calcium-vitamin D (OSCAL WITH D) 500-200 MG-UNIT TABS tablet Take 1 tablet by mouth 2 (two) times daily with a meal.  [provider]  escitalopram (LEXAPRO) 20 MG tablet Take 20 mg by mouth daily. 11/04/20   [provider]  furosemide (LASIX) 20 MG tablet Take 20 mg by mouth daily. 01/16/21   [provider]  ipratropium-albuterol (DUONEB) 0.5-2.5 (3) MG/3ML SOLN Inhale 3 mLs into the lungs every 6 (six) hours as needed for shortness of breath.    [provider]  levothyroxine (SYNTHROID) 25 MCG tablet Take 25 mcg by mouth every morning. 11/20/20   [provider]  LINZESS 290 MCG CAPS capsule Take 290 mcg by mouth daily as needed (constipation). 11/15/20   [provider]  midodrine (PROAMATINE) 10 MG tablet Take 1 tablet (10 mg total) by mouth 3 (three) times daily  with meals. 02/20/21 03/22/21  [name], MD  Oxycodone HCl 20 MG TABS 1 tablet as needed 02/07/21   [provider]  pantoprazole (PROTONIX) 40 MG tablet Take 1 tablet (40 mg total) by mouth 2 (two) times daily. 02/20/21   [name], MD  pramipexole (MIRAPEX) 0.5 MG tablet Take 0.5 mg by mouth at bedtime. 12/06/20   [provider]  predniSONE (DELTASONE) 20 MG tablet Prednisone 60 mg daily for 1 week followed by  Prednisone 50 mg daily for 1 week followed by  Prednisone 40 mg daily for 1 week followed by  Prednisone 30 mg daily for 1 week followed by  Prednisone 20 mg daily for 1 week followed by  Prednisone 10 mg daily for 1 weeks followed by  Prednisone 5 mg daily for 1 weeks. Patient not taking: Reported on 02/26/2021 02/20/21   [name], MD  QUEtiapine (SEROQUEL) 200 MG tablet Take 200 mg by mouth at bedtime. 02/01/21   [provider]  rOPINIRole (REQUIP) 0.5 MG tablet Take 0.5 mg by mouth 3 (three) times daily.    [provider]    Allergies    Patient has no known allergies.  Review of Systems   Review of Systems  Hematological:  Bruises/bleeds easily.  Ten systems reviewed and are negative for acute change, except as noted in the HPI.   Physical Exam Updated Vital Signs BP 114/75 (BP Location: Left Arm)   Pulse 93   Temp 98.3 F (36.8 C)   Resp 20   SpO2 94%   Physical Exam Vitals and nursing note reviewed.  Constitutional:      General: She is not in acute distress.    Appearance: She is well-developed. She is not diaphoretic.  HENT:     Head: Normocephalic and atraumatic.     Right Ear: External ear normal.     Left Ear: External ear normal.     Nose: Nose normal.     Mouth/Throat:     Mouth: Mucous membranes are moist.  Eyes:     General: No scleral icterus.    Conjunctiva/sclera: Conjunctivae normal.  Cardiovascular:     Rate and Rhythm: Normal rate and regular rhythm.     Heart sounds: Normal heart sounds. No  murmur heard.   No friction rub. No gallop.  Pulmonary:     Effort: Pulmonary effort is normal. No respiratory distress.     Breath sounds: Normal breath sounds.  Abdominal:     General: Bowel sounds are normal. There is no distension.     Palpations: Abdomen is soft. There is no mass.     Tenderness: There is no abdominal tenderness. There is no guarding.  Musculoskeletal:     Cervical back: Normal range  of motion.  Skin:    General: Skin is warm and dry.     Findings: Bruising, ecchymosis and petechiae present.     Comments: Petechiae of the mouth and throat, extremities Large Hematoma R upper Arm 1 cm open wound on R knee, actively bleeding  Neurological:     Mental Status: She is alert and oriented to person, place, and time.  Psychiatric:        Behavior: Behavior normal.    ED Results / Procedures / Treatments   Labs (all labs ordered are listed, but only abnormal results are displayed) Labs Reviewed  BASIC METABOLIC PANEL - Abnormal; Notable for the following components:      Result Value   Glucose, Bld 103 (*)    All other components within normal limits  CBC WITH DIFFERENTIAL/PLATELET  POC OCCULT BLOOD, ED  TYPE AND SCREEN    EKG None  Radiology No results found.  Procedures .Critical Care  Date/Time: 03/05/2021 5:23 PM Performed by: Margarita Mail, PA-C Authorized by: Margarita Mail, PA-C   Critical care provider statement:    Critical care time (minutes):  50   Critical care time was exclusive of:  Separately billable procedures and treating other patients   Critical care was necessary to treat or prevent imminent or life-threatening deterioration of the following conditions:  Circulatory failure   Critical care was time spent personally by me on the following activities:  Discussions with consultants, evaluation of patient's response to treatment, examination of patient, ordering and performing treatments and interventions, ordering and review of  laboratory studies, ordering and review of radiographic studies, pulse oximetry, re-evaluation of patient's condition, obtaining history from patient or surrogate and review of old charts   Medications Ordered in ED Medications - No data to display  ED Course  I have reviewed the triage vital signs and the nursing notes.  Pertinent labs & imaging results that were available during my care of the patient were reviewed by me and considered in my medical decision making (see chart for details).    MDM Rules/Calculators/A&P                          Patient here with Known ITP, recurrent or refractory. She saw a specialist on 8/10 and her Platelets were normal, she was told she did not need to take her steroids. She presents with dark stool, petechiae, gum bleeding and bleeding form a wound on her knee. The patient now has Platelet level of <5. I have deferred rectal exam for melena and nasal swab for COVID due to her risk of bleeding.  We were able to establish an IV and patient will receive 125 mg of Solu-Medrol and 4 units of pheresis platelets.  I discussed the case with Dr. Alen Blew of hematology who will consult on the patient.  I have reviewed the patient's labs.  Her hemoglobin is 13,000.  Platelets are less than 5.  Her BMP is without significant abnormality.  She has a pending hepatic function panel.  Patient is critically ill with risk for significant bleeding and low platelets.  She is currently stable.  Her blood pressure is soft which may indicate GI bleeding that is of more significance that she will need close monitoring.  Patient will be admitted to the hospital by Dr. Louanne Belton. Final Clinical Impression(s) / ED Diagnoses Final diagnoses:  None    Rx / DC Orders ED Discharge Orders     None  Margarita Mail, PA-C 03/05/21 [place], [place], DO 03/05/21 [phone]

## 2021-03-05 NOTE — ED Notes (Signed)
Admitting advised to perfom Covid test based on no previous hx of nose bleeds and having had one at last admission for the same problem.

## 2021-03-05 NOTE — ED Notes (Signed)
Pt complaining of HA that originated prior to start of transfusion

## 2021-03-05 NOTE — ED Notes (Addendum)
Per discussion with EDP and admitting doctors we are striving to hold off on Covid test until platelets are better to avoid nosebleed d/t platelet count of less than 5.Jean Kramer

## 2021-03-05 NOTE — Progress Notes (Signed)
HEMATOLOGY-ONCOLOGY PROGRESS NOTE  SUBJECTIVE: Jean Kramer known to me with history of refractory ITP with recent hospitalization for GI bleeding and presenting with a platelet count of less than 5000.  She was discharged on February 20, 2021 after her platelets improved up to 63,000.  He was evaluated by Dr. Hinton Rao at [facility] with a platelet count at that time was 168.  She presents today with excessive bleeding, petechiae and laboratory data in the emergency department showed a platelet count of less than 5.  Her hemoglobin is 13.0.  She did report some discoloration of her stool and become more melanotic but no hematochezia or hemoptysis.    PHYSICAL EXAMINATION:  Vitals:   03/05/21 1617 03/05/21 1630  BP: 93/71 98/81  Pulse: 91 87  Resp: 19 19  Temp:    SpO2: 96% 96%   There were no vitals filed for this visit.      General appearance: Comfortable appearing without any discomfort Head: Normocephalic without any trauma Oropharynx: Mucous membranes are moist and pink without any thrush or ulcers. Eyes: Pupils are equal and round reactive to light. Lymph nodes: No cervical, supraclavicular, inguinal or axillary lymphadenopathy.   Heart:regular rate and rhythm.  S1 and S2 without leg edema. Lung: Clear without any rhonchi or wheezes.  No dullness to percussion. Abdomin: Soft, nontender, nondistended with good bowel sounds.  No hepatosplenomegaly. Musculoskeletal: No joint deformity or effusion.  Full range of motion noted. Neurological: No deficits noted on motor, sensory and deep tendon reflex exam. Skin: Petechial rash noted on upper and lower extremities.  Ecchymosis noted on her upper extremities.    LABORATORY DATA:  I have reviewed the data as listed CMP Latest Ref Rng & Units 03/05/2021 02/26/2021 02/20/2021  Glucose 70 - 99 mg/dL 103(H) - 112(H)  BUN 8 - 23 mg/dL '15 19 18  '$ Creatinine 0.44 - 1.00 mg/dL 0.81 0.9 0.81  Sodium 135 - 145 mmol/L 135 137 132(L)   Potassium 3.5 - 5.1 mmol/L 4.5 4.0 4.7  Chloride 98 - 111 mmol/L 104 102 96(L)  CO2 22 - 32 mmol/L 22 27(A) 28  Calcium 8.9 - 10.3 mg/dL 9.7 9.4 9.2  Total Protein 6.5 - 8.1 g/dL 7.4 - -  Total Bilirubin 0.3 - 1.2 mg/dL 0.6 - -  Alkaline Phos 38 - 126 U/L 76 132(A) -  AST 15 - 41 U/L 82(H) 52(A) -  ALT 0 - 44 U/L 63(H) 58(A) -    Lab Results  Component Value Date   WBC 9.5 03/05/2021   HGB 13.0 03/05/2021   HCT 39.8 03/05/2021   MCV 99.7 03/05/2021   PLT <5 (LL) 03/05/2021   NEUTROABS 5.5 03/05/2021     ASSESSMENT AND PLAN:  70 year old woman with:  1.  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  She is status post recent hospitalization where she required Nplate after being refractory to steroids and IVIG.  From a management standpoint, I recommended transfusion given her platelet count of less than 5 with a goal to keep her platelet after transfusion of above 10,000.  I recommend continuing Solu-Medrol after 125 mg and switching to 80 mg daily.  I will restart Nplate at 10 mcg/kg on a weekly basis with the first dose to be given today.  This dose can be repeated in 48 to 72 hours if no response in her platelet is noted.   2.  Leukocytosis: Resolved at this time after being reactive.   3.  GI  bleeding: Endoscopy was deferred without any clear-cut active bleeding at this time.  4.  Disposition: We will continue to follow during her hospitalization.   35  minutes were spent on this encounter.  50% of the time was face-to-face and spent on reviewing her disease status, treatment choices, addressing complications related to her condition.   LOS: 0 days   Zola Button, MD 03/05/21

## 2021-03-05 NOTE — H&P (Addendum)
[facility] History and Physical  Jean Kramer [id number] DOB: Jul 23, 1950 DOA: 03/05/2021  Referring physician: ED  PCP: [name], MD   Patient is coming from: Home  Chief Complaint: Acute thrombocytopenia with bleeding.  HPI: Jean Kramer is a 70 y.o. 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  She was unable to stop it,  so decided to come to the hospital for further management.  Patient also complained of a spontaneous bruising of her extremities including petechiae over her lower extremities.  She denies nasal bleeding but has been having black tarry stools since the time of her discharge.  Complains of mild headache but is not new for her.  She does have chronic abdominal pain as well.  Denies any shortness of breath, cough, fever, chills or rigor.  Denies any dizziness, lightheadedness or syncope.  Denies any chest pain or palpitations.  Denies any contacts or recent travel.  Denies any urinary urgency, frequency or dysuria.  Of note patient was recently seen on 02/26/2021 for hematology follow-up in the clinic and at that time her platelet count was 160 or more.  On the last admission patient was supposed to be on a tapering dose of prednisone after discharge but the patient had not r taken her home but fortunately her platelet count had improved to >160K on 02/26/2021.  ED Course: In the ED, patient was noted to have significant thrombocytopenia with platelet count of less than 5000.  Hemoglobin was around 13.0..  She was noted to have oozing from her knee and ongoing melena.  Compression bandage was placed.  Oncology was consulted from the ED and recommended platelet transfusion and Solu-Medrol 125 mg IV.  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 She has never used smokeless tobacco. No history on file for alcohol use and drug use.  No Known Allergies  History reviewed. No pertinent family history.   Prior to Admission medications   Medication Sig Start Date End Date Taking? Authorizing Provider  acetaminophen (TYLENOL) 500 MG tablet Take 1,000 mg by mouth every 6 (six) hours as needed for mild pain.   Yes [provider]  alendronate (FOSAMAX) 70 MG tablet Take 70 mg by mouth once a week. Friday 12/06/20  Yes [provider]  ALPRAZolam Duanne Moron) 1 MG tablet Take 0.5-1 mg by mouth daily as needed for anxiety. Only gets 10 pills a month 01/16/21  Yes [provider]  aspirin 81 MG EC tablet Take 1 tablet (81 mg total) by mouth daily. 03/06/21  Yes [name], MD  atorvastatin (LIPITOR) 20 MG tablet Take 1 tablet (20 mg total) by mouth daily. 02/21/21  Yes [name], MD  budesonide-formoterol (SYMBICORT) 160-4.5 MCG/ACT inhaler Inhale 2 puffs into the lungs 2 (two) times daily as needed (shortness of breath).   Yes [provider]  busPIRone (BUSPAR) 15  MG tablet Take 15 mg by mouth 2 (two) times daily. 12/03/20  Yes [provider]  calcium-vitamin D (OSCAL WITH D) 500-200 MG-UNIT TABS tablet Take 1 tablet by mouth 2 (two) times daily with a meal.   Yes [provider]  furosemide (LASIX) 20 MG tablet Take 20 mg by mouth daily. 01/16/21  Yes [provider]  ipratropium-albuterol (DUONEB) 0.5-2.5 (3) MG/3ML SOLN Inhale 3 mLs into the lungs every 6 (six) hours as needed for  shortness of breath.   Yes [provider]  levothyroxine (SYNTHROID) 25 MCG tablet Take 25 mcg by mouth every morning. 11/20/20  Yes [provider]  LINZESS 290 MCG CAPS capsule Take 290 mcg by mouth daily as needed (constipation). 11/15/20  Yes [provider]  midodrine (PROAMATINE) 10 MG tablet Take 1 tablet (10 mg total) by mouth 3 (three) times daily with meals. 02/20/21 03/22/21 Yes [name], MD  Omega-3 1000 MG CAPS Take 2 capsules by mouth daily.   Yes [provider]  Oxycodone HCl 20 MG TABS 1 tablet as needed 02/07/21  Yes [provider]  pantoprazole (PROTONIX) 40 MG tablet Take 1 tablet (40 mg total) by mouth 2 (two) times daily. 02/20/21  Yes [name], MD  pramipexole (MIRAPEX) 0.5 MG tablet Take 0.5 mg by mouth at bedtime. 12/06/20  Yes [provider]  QUEtiapine (SEROQUEL) 200 MG tablet Take 200 mg by mouth at bedtime. 02/01/21  Yes [provider]  rOPINIRole (REQUIP) 0.5 MG tablet Take 0.5 mg by mouth 3 (three) times daily.   Yes [provider]  predniSONE (DELTASONE) 20 MG tablet Prednisone 60 mg daily for 1 week followed by  Prednisone 50 mg daily for 1 week followed by  Prednisone 40 mg daily for 1 week followed by  Prednisone 30 mg daily for 1 week followed by  Prednisone 20 mg daily for 1 week followed by  Prednisone 10 mg daily for 1 weeks followed by  Prednisone 5 mg daily for 1 weeks. Patient not taking: No sig reported 02/20/21   [name], MD    Physical Exam: Vitals:   03/05/21 1354 03/05/21 1356 03/05/21 1617 03/05/21 1630  BP: 114/75  93/71 98/81  Pulse: 93  91 87  Resp: '20  19 19  '$ Temp:  98.3 F (36.8 C)    SpO2: 94%  96% 96%   Wt Readings from Last 3 Encounters:  02/26/21 79.1 kg  02/20/21 79.1 kg   There is no height or weight on file to calculate BMI.  General:  Average built, not in obvious distress HENT: Normocephalic, pupils equally reacting to light and  accommodation.  No scleral pallor or icterus noted. Oral mucosa is moist.  Chest:  Clear breath sounds.  Diminished breath sounds bilaterally. No crackles or wheezes.  CVS: S1 &S2 heard. No murmur.  Regular rate and rhythm. Abdomen: Soft, nontender, nondistended.  Bowel sounds are heard.  Liver is not palpable, no abdominal mass palpated Extremities: No cyanosis, clubbing or edema.  Peripheral pulses are palpable.  Right knee on a compression bandage. Psych: Alert, awake and oriented, normal mood CNS:  No cranial nerve deficits.  Power equal in all extremities.   Skin: Warm and dry.  Bilateral lower extremity and upper extremity with petechial hemorrhages and bruises over the upper extremities.  Labs on Admission:   CBC: Recent Labs  Lab 03/05/21 1441  WBC 9.5  NEUTROABS 5.5  HGB 13.0  HCT 39.8  MCV 99.7  PLT <5*    Basic Metabolic Panel: Recent Labs  Lab 03/05/21 1441  NA 135  K 4.5  CL 104  CO2 22  GLUCOSE 103*  BUN 15  CREATININE 0.81  CALCIUM 9.7    Liver Function Tests: No results for input(s): AST, ALT, ALKPHOS, BILITOT, PROT, ALBUMIN in the last 168 hours. No results for input(s): LIPASE, AMYLASE in the last 168 hours. No results for input(s): AMMONIA in the last 168 hours.  Cardiac Enzymes: No results for input(s): CKTOTAL, CKMB, CKMBINDEX, TROPONINI in the last 168 hours.  BNP (last 3 results) No results for input(s): BNP in the last 8760 hours.  ProBNP (last 3 results) No results for input(s): PROBNP in the last 8760 hours.  CBG: No results for input(s): GLUCAP in the last 168 hours.  Lipase  No results found for: LIPASE   Urinalysis    Component Value Date/Time   COLORURINE YELLOW 02/15/2021 1434   APPEARANCEUR HAZY (A) 02/15/2021 1434   LABSPEC 1.028 02/15/2021 1434   PHURINE 5.0 02/15/2021 1434   [place] 02/15/2021 1434   HGBUR NEGATIVE 02/15/2021 1434   BILIRUBINUR NEGATIVE 02/15/2021 1434   KETONESUR NEGATIVE 02/15/2021 1434    PROTEINUR NEGATIVE 02/15/2021 1434   NITRITE NEGATIVE 02/15/2021 1434   LEUKOCYTESUR LARGE (A) 02/15/2021 1434     Drugs of Abuse  No results found for: LABOPIA, [place], [place], [place], THCU, LABBARB    Radiological Exams on Admission: No results found.  EKG: None at this time  Assessment/Plan Active Problems:   Acute ITP (HCC)   Melena   History of splenectomy   Chronic ITP (idiopathic thrombocytopenia) (HCC)   Severe thrombocytopenia (HCC)  Ongoing bleeding from the knee after scab formation with bruises and melena.  Patient was just lateral hospital for melena.  Patient does have acute thrombocytopenia at this time.  Oncology was consulted from the ED who recommended Solu-Medrol x1 and platelet transfusion.  Patient will be started on Nplate as well.  Trend CBC.  If required will transfuse PRBC for hemoglobin less than 7.  Will transfuse 1 unit of platelet now.  We will check CBC every 8 hourly and transfuse platelets to keep platelet more than 10K.  Oncology will follow the patient while in hospital.  Acute on chronic thrombocytopenia.  Status post a splenectomy in the past.  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  Will need oncology involvement in this patient with ongoing bleeding and high risk of spontaneous bleed.  History of recent GI bleed.  Melena.  Patient recently had upper GI endoscopy done.  Thought to be secondary to thrombocytopenia and use of NSAIDs.  We will continue with platelet transfusion and improving platelet counts.  Protonix twice daily.  Very high risk for any endoscopic intervention at this time.  We will continue to monitor closely.  History of COPD.  Currently compensated.  Continues to smoke.  We will continue with inhalers.  Add nicotine patch.  History of stroke.  We will hold aspirin for now due to severe thrombocytopenia and bleeding  chronic CHF currently compensated.  We will closely monitor while in the hospital.  On midodrine  outpatient due to hypotension intermittently mostly in the morning.  History of hypothyroidism.  On Synthroid at home.  We will continue  Nicotine use disorder.  Continues to smoke.  Patient was counseled about it.  Does not wish to be on a nicotine patch    DVT Prophylaxis: None due to active bleeding.  High risk of  bleeding from SCDs.  Patient has been spontaneously bleeding   Consultant: Heme and oncology  Code Status: Full code  Microbiology none  Antibiotics: None  Family Communication:  Patients' condition and plan of care including tests being ordered have been discussed with the patient and the patient's son at bedside who indicate understanding and agree with the plan.   Status is: Inpatient   Dispo: The patient is from: Home              Anticipated d/c is to: Home           Severity of Illness: The appropriate patient status for this patient is INPATIENT. Inpatient status is judged to be reasonable and necessary in order to provide the required intensity of service to ensure the patient's safety. The patient's presenting symptoms, physical exam findings, and initial radiographic and laboratory data in the context of their chronic comorbidities is felt to place them at high risk for further clinical deterioration. Furthermore, it is not anticipated that the patient will be medically stable for discharge from the hospital within 2 midnights of admission.  I certify that at the point of admission it is my clinical judgment that the patient will require inpatient hospital care spanning beyond 2 midnights from the point of admission due to high intensity of service, high risk for further deterioration and high frequency of surveillance required.  Signed, Flora Lipps, MD [facility] 03/05/2021

## 2021-03-05 NOTE — ED Provider Notes (Signed)
Emergency Medicine Provider Triage Evaluation Note  Jean Kramer , a 70 y.o. female  was evaluated in triage.  Pt complains of bleeding from her right knee.  Had a fall 2 weeks ago this morning took off the dressing from an abrasion has not stopped bleeding.  She has a history of ITP, last platelet count 1 week ago was in the 100s.  Reports having some bruising in her upper extremities.  States that she feels generally weak.  Reports dark and tarry stools this morning.  Denies chest pain.  Review of Systems  Positive: Bleeding, dark stools, generalized weakness Negative: Chest pain  Physical Exam  BP 114/75 (BP Location: Left Arm)   Pulse 93   Temp 98.3 F (36.8 C)   Resp 20   SpO2 94%  Gen:   Awake, no distress   Resp:  Normal effort  MSK:   Moves extremities without difficulty  Other:  Wound to right knee, 2+ DP pulse palpated  Medical Decision Making  Medically screening exam initiated at 2:34 PM.  Appropriate orders placed.  Jean Kramer was informed that the remainder of the evaluation will be completed by another provider, this initial triage assessment does not replace that evaluation, and the importance of remaining in the ED until their evaluation is complete.  Lab work ordered, room soon   Delia Heady, Hershal Coria 03/05/21 [place], [place], MD 03/08/21 [id number]

## 2021-03-05 NOTE — ED Provider Notes (Signed)
I personally evaluated the patient during the encounter and completed a history, physical, procedures, medical decision making to contribute to the overall care of the patient and decision making for the patient briefly, the patient is a 70 y.o. female with history of ITP who presents to the ED with bleeding from her knee.  Platelets found to be less than 5.  She had a scab on her right knee that started to bleed.  Has been wrapped with quick clot and with Kerlix wrap.  Bleeding is stopped.  She is has some dark stools today.  Some bleeding of her gums.  Vital signs are normal.  Hemoglobin is 13.  Recent admission for the same.  We will give IV Solu-Medrol and transfuse with platelets.  Hematology has been consulted and is well aware of this patient.  She did have some bleeding and melena from this at last hospital stay about a month ago.  Overall we will not do COVID test or rectal exam due to concern for causing more bleeding.  Hemodynamically she stable.  Hemoglobin is stable.  Will be admitted to hospitalist with hematology following.  Admitted for suspected refractory ITP.  This chart was dictated using voice recognition software.  Despite best efforts to proofread,  errors can occur which can change the documentation meaning.    EKG Interpretation None            Lennice Sites, DO 03/05/21 1719

## 2021-03-05 NOTE — ED Triage Notes (Signed)
Pt had a fall 2 weeks ago, had a scab to her right knee that she pulled off this morning, since then her knee has not stopped bleeding. Pt also reports dark tarry stools this morning. Pt has hx of low platelets.

## 2021-03-06 DIAGNOSIS — D693 Immune thrombocytopenic purpura: Secondary | ICD-10-CM | POA: Diagnosis not present

## 2021-03-06 DIAGNOSIS — D696 Thrombocytopenia, unspecified: Secondary | ICD-10-CM | POA: Diagnosis not present

## 2021-03-06 DIAGNOSIS — Z9081 Acquired absence of spleen: Secondary | ICD-10-CM | POA: Diagnosis not present

## 2021-03-06 DIAGNOSIS — K921 Melena: Secondary | ICD-10-CM | POA: Diagnosis not present

## 2021-03-06 LAB — CBC
HCT: 35.6 % — ABNORMAL LOW (ref 36.0–46.0)
HCT: 34.1 % — ABNORMAL LOW (ref 36.0–46.0)
HCT: 32.5 % — ABNORMAL LOW (ref 36.0–46.0)
Hemoglobin: 11.6 g/dL — ABNORMAL LOW (ref 12.0–15.0)
Hemoglobin: 11.1 g/dL — ABNORMAL LOW (ref 12.0–15.0)
Hemoglobin: 10.7 g/dL — ABNORMAL LOW (ref 12.0–15.0)
MCH: 32.1 pg (ref 26.0–34.0)
MCH: 31.9 pg (ref 26.0–34.0)
MCH: 32.5 pg (ref 26.0–34.0)
MCHC: 32.6 g/dL (ref 30.0–36.0)
MCHC: 32.6 g/dL (ref 30.0–36.0)
MCHC: 32.9 g/dL (ref 30.0–36.0)
MCV: 98.6 fL (ref 80.0–100.0)
MCV: 98 fL (ref 80.0–100.0)
MCV: 98.8 fL (ref 80.0–100.0)
Platelets: <5 10*3/uL — CL (ref 150–400)
Platelets: <5 10*3/uL — CL (ref 150–400)
Platelets: <5 10*3/uL — CL (ref 150–400)
RBC: 3.61 MIL/uL — ABNORMAL LOW (ref 3.87–5.11)
RBC: 3.48 MIL/uL — ABNORMAL LOW (ref 3.87–5.11)
RBC: 3.29 MIL/uL — ABNORMAL LOW (ref 3.87–5.11)
RDW: 15.7 % — ABNORMAL HIGH (ref 11.5–15.5)
RDW: 15.8 % — ABNORMAL HIGH (ref 11.5–15.5)
RDW: 15.9 % — ABNORMAL HIGH (ref 11.5–15.5)
WBC: 13.5 10*3/uL — ABNORMAL HIGH (ref 4.0–10.5)
WBC: 29.2 10*3/uL — ABNORMAL HIGH (ref 4.0–10.5)
WBC: 28.3 10*3/uL — ABNORMAL HIGH (ref 4.0–10.5)
nRBC: 0 % (ref 0.0–0.2)
nRBC: 0.1 % (ref 0.0–0.2)
nRBC: 0.1 % (ref 0.0–0.2)

## 2021-03-06 LAB — COMPREHENSIVE METABOLIC PANEL
ALT: 56 U/L — ABNORMAL HIGH (ref 0–44)
AST: 65 U/L — ABNORMAL HIGH (ref 15–41)
Albumin: 2.9 g/dL — ABNORMAL LOW (ref 3.5–5.0)
Alkaline Phosphatase: 63 U/L (ref 38–126)
Anion gap: 13 (ref 5–15)
BUN: 17 mg/dL (ref 8–23)
CO2: 22 mmol/L (ref 22–32)
Calcium: 9.5 mg/dL (ref 8.9–10.3)
Chloride: 100 mmol/L (ref 98–111)
Creatinine, Ser: 0.95 mg/dL (ref 0.44–1.00)
GFR, Estimated: >60 mL/min (ref >60)
Glucose, Bld: 203 mg/dL — ABNORMAL HIGH (ref 70–99)
Potassium: 3.9 mmol/L (ref 3.5–5.1)
Sodium: 135 mmol/L (ref 135–145)
Total Bilirubin: 0.6 mg/dL (ref 0.3–1.2)
Total Protein: 6.8 g/dL (ref 6.5–8.1)

## 2021-03-06 MED ORDER — OXYCODONE HCL 5 MG PO TABS: 20.0000 mg | ORAL_TABLET | Freq: Three times a day (TID) | ORAL | Status: DC | PRN

## 2021-03-06 MED ORDER — OXYCODONE HCL 5 MG PO TABS
10.0000 mg | ORAL_TABLET | ORAL | Status: DC | PRN
Start: 2021-03-06 — End: 2021-03-09
  Administered 2021-03-06 – 2021-03-09 (×11): 10 mg via ORAL
  Filled 2021-03-06 (×11): qty 2

## 2021-03-06 MED ORDER — SODIUM CHLORIDE 0.9% IV SOLUTION: Freq: Once | INTRAVENOUS | Status: AC

## 2021-03-06 NOTE — ED Notes (Addendum)
Clarified platelet order c Dr Louanne Belton as there is new order today and order from yesterday; not sure if pt was supposed to receive platelets yesterday and did not, or if order was postponed. Clarifying if MD wants 1 or 2 units platelets. Awaiting MD response.  Addendum [id number] MD responds just 1 unit platelets at this time

## 2021-03-06 NOTE — ED Notes (Signed)
Pt up to recliner at bedside, denies needs, no distress noted

## 2021-03-06 NOTE — Plan of Care (Signed)

## 2021-03-06 NOTE — ED Notes (Signed)
Recliner given to family that has been in a straight back chair since yesterday

## 2021-03-06 NOTE — Progress Notes (Signed)
Events noted overnight.  Laboratory data reviewed without any dramatic changes noted on her counts.  Her platelet count continues to be less than 5000 which is consistent with refractory ITP.  From a management standpoint, I recommend repeating platelet transfusion if bleeding is noted today.  I anticipate response from her Nplate injection in the next 24 to 48 hours.  If none noted, will repeat that injection at that time.  I recommend continued supportive management otherwise.  Will continue to follow.

## 2021-03-06 NOTE — Progress Notes (Signed)
PROGRESS NOTE  Jean Kramer [id number] DOB: 1950/09/08 DOA: 03/05/2021 PCP: [name], MD   LOS: 1 day   Brief narrative: Jean Kramer is a 70 y.o. 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  Patient also continued to have melanotic stools.   In the ED, patient was noted to have significant thrombocytopenia with platelet count of less than 5000.  Hemoglobin was around 13.0..  She was noted to have oozing from her knee and ongoing melena.  Compression bandage was placed.  Oncology was consulted from the ED and patient was admitted hospital for further evaluation and treatment.  Assessment/Plan:  Ongoing bleeding from the knee after scab removal.  On compression bandage.  Continue to transfuse to keep platelet more than 10K if possible.  Oncology on board.  Patient received 1 dose of Solu-Medrol.  Has received Nplate injection  Hemoglobin has slightly trended down.  No need for PRBC transfusion so far.  Follow oncology recommendations.     Acute on chronic thrombocytopenia.  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  Oncology following.   History of recent GI bleed.  Melena.  Thought to be secondary to thrombocytopenia and use of NSAIDs.  Transfused platelets yesterday.  Will transfuse 1 unit again today. Continue Protonix twice daily.  Very high risk for any endoscopic intervention at this time.  We will continue to monitor closely.   History of COPD.  Currently compensated.  Patient continues to smoke.  We will continue with inhalers.     History of stroke.  We will hold aspirin for now due to severe thrombocytopenia and bleeding   chronic CHF currently compensated.  Monitor intake output charting and  daily weights in the hospital.    History of hypothyroidism.  Continue Synthroid  Nicotine use disorder.  Does not wish nicotine patch  DVT prophylaxis:   None including SCD due to risk for bruising and spontaneous bleed.  Code Status: Full code  Family Communication: Spoke with the patient's son at bedside.  Status is: Inpatient  Remains inpatient appropriate because:IV treatments appropriate due to intensity of illness or inability to take PO and Inpatient level of care appropriate due to severity of illness  Dispo: The patient is from: Home              Anticipated d/c is to: Home              Patient currently is not medically stable to d/c.   Difficult to place patient No  Consultants: oncology  Procedures: Platelet transfusion  Anti-infectives:  None  Anti-infectives (From admission, onward)    None       Subjective: Today, patient was seen and examined at bedside.  Feels overwhelmed by her diagnosis.  Feels anxious.  Has melena and one bright red stool today, no bleeding from the nose.  Objective: Vitals:   03/06/21 0900 03/06/21 1017  BP: 104/75 97/73  Pulse: 94 95  Resp: 16 19  Temp:  98 F (36.7 C)  SpO2: 94%     Intake/Output Summary (Last 24 hours) at 03/06/2021 1104 Last data filed at 03/05/2021 2009 Gross per 24 hour  Intake 945 ml  Output --  Net 945 ml   There were no vitals filed  for this visit. There is no height or weight on file to calculate BMI.   Physical Exam: GENERAL: Patient is alert awake and oriented. Not in obvious distress.  Anxious and slightly tearful HENT: No scleral pallor or icterus. Pupils equally reactive to light. Oral mucosa is moist NECK: is supple, no gross swelling noted. CHEST: Clear to auscultation. No crackles or wheezes.  Diminished breath sounds bilaterally. CVS: S1 and S2 heard, no murmur. Regular rate and rhythm.  ABDOMEN: Soft, non-tender, bowel sounds are present. EXTREMITIES: Bilateral lower  extremity with petechial hemorrhage.  Right knee on a bandage.  Bruises over the upper extremities. CNS: Cranial nerves are intact. No focal motor deficits. SKIN: warm and dry, Petechia over the lower extremity, bruises  Data Review: I have personally reviewed the following laboratory data and studies,  CBC: Recent Labs  Lab 03/05/21 1441 03/05/21 2000 03/06/21 0353  WBC 9.5 19.9* 13.5*  NEUTROABS 5.5  --   --   HGB 13.0 12.6 11.6*  HCT 39.8 39.3 35.6*  MCV 99.7 101.6* 98.6  PLT <5* <5* <5*   Basic Metabolic Panel: Recent Labs  Lab 03/05/21 1441 03/06/21 0350  NA 135 135  K 4.5 3.9  CL 104 100  CO2 22 22  GLUCOSE 103* 203*  BUN 15 17  CREATININE 0.81 0.95  CALCIUM 9.7 9.5   Liver Function Tests: Recent Labs  Lab 03/05/21 1441 03/06/21 0350  AST 82* 65*  ALT 63* 56*  ALKPHOS 76 63  BILITOT 0.6 0.6  PROT 7.4 6.8  ALBUMIN 3.1* 2.9*   No results for input(s): LIPASE, AMYLASE in the last 168 hours. No results for input(s): AMMONIA in the last 168 hours. Cardiac Enzymes: No results for input(s): CKTOTAL, CKMB, CKMBINDEX, TROPONINI in the last 168 hours. BNP (last 3 results) No results for input(s): BNP in the last 8760 hours.  ProBNP (last 3 results) No results for input(s): PROBNP in the last 8760 hours.  CBG: No results for input(s): GLUCAP in the last 168 hours. Recent Results (from the past 240 hour(s))  Resp Panel by RT-PCR (Flu A&B, Covid) Nasopharyngeal Swab     Status: None   Collection Time: 03/05/21  6:45 PM   Specimen: Nasopharyngeal Swab; Nasopharyngeal(NP) swabs in vial transport medium  Result Value Ref Range Status   SARS Coronavirus 2 by RT PCR NEGATIVE NEGATIVE Final    Comment: (NOTE) SARS-CoV-2 target nucleic acids are NOT DETECTED.  The SARS-CoV-2 RNA is generally detectable in upper respiratory specimens during the acute phase of infection. The lowest concentration of SARS-CoV-2 viral copies this assay can detect is 138 copies/mL. A  negative result does not preclude SARS-Cov-2 infection and should not be used as the sole basis for treatment or other patient management decisions. A negative result may occur with  improper specimen collection/handling, submission of specimen other than nasopharyngeal swab, presence of viral mutation(s) within the areas targeted by this assay, and inadequate number of viral copies(<138 copies/mL). A negative result must be combined with clinical observations, patient history, and epidemiological information. The expected result is Negative.  Fact Sheet for Patients:  EntrepreneurPulse.com.au  Fact Sheet for Healthcare Providers:  IncredibleEmployment.be  This test is no t yet approved or cleared by the [place] FDA and  has been authorized for detection and/or diagnosis of SARS-CoV-2 by FDA under an Emergency Use Authorization (EUA). This EUA will remain  in effect (meaning this test can be used) for the duration of the COVID-19 declaration under Section 564(b)(1)  of the Act, 21 U.S.C.section 360bbb-3(b)(1), unless the authorization is terminated  or revoked sooner.       Influenza A by PCR NEGATIVE NEGATIVE Final   Influenza B by PCR NEGATIVE NEGATIVE Final    Comment: (NOTE) The Xpert Xpress SARS-CoV-2/FLU/RSV plus assay is intended as an aid in the diagnosis of influenza from Nasopharyngeal swab specimens and should not be used as a sole basis for treatment. Nasal washings and aspirates are unacceptable for Xpert Xpress SARS-CoV-2/FLU/RSV testing.  Fact Sheet for Patients: EntrepreneurPulse.com.au  Fact Sheet for Healthcare Providers: IncredibleEmployment.be  This test is not yet approved or cleared by the [place] FDA and has been authorized for detection and/or diagnosis of SARS-CoV-2 by FDA under an Emergency Use Authorization (EUA). This EUA will remain in effect (meaning this test can  be used) for the duration of the COVID-19 declaration under Section 564(b)(1) of the Act, 21 U.S.C. section 360bbb-3(b)(1), unless the authorization is terminated or revoked.  Performed at [facility], [place] [address] [place], [place] [address]      Studies: No results found.    Flora Lipps, MD  [facility] 03/06/2021  If 7PM-7AM, please contact night-coverage

## 2021-03-07 DIAGNOSIS — D693 Immune thrombocytopenic purpura: Secondary | ICD-10-CM | POA: Diagnosis not present

## 2021-03-07 DIAGNOSIS — Z9081 Acquired absence of spleen: Secondary | ICD-10-CM | POA: Diagnosis not present

## 2021-03-07 DIAGNOSIS — D696 Thrombocytopenia, unspecified: Secondary | ICD-10-CM | POA: Diagnosis not present

## 2021-03-07 DIAGNOSIS — K921 Melena: Secondary | ICD-10-CM | POA: Diagnosis not present

## 2021-03-07 LAB — BPAM PLATELET PHERESIS
Blood Product Expiration Date: 202208192359
Blood Product Expiration Date: 202208192359
ISSUE DATE / TIME: 202208171800
ISSUE DATE / TIME: 202208181011
Unit Type and Rh: 6200
Unit Type and Rh: 6200

## 2021-03-07 LAB — PREPARE PLATELET PHERESIS
Unit division: 0
Unit division: 0

## 2021-03-07 LAB — CBC
HCT: 31.5 % — ABNORMAL LOW (ref 36.0–46.0)
Hemoglobin: 10.4 g/dL — ABNORMAL LOW (ref 12.0–15.0)
MCH: 32.8 pg (ref 26.0–34.0)
MCHC: 33 g/dL (ref 30.0–36.0)
MCV: 99.4 fL (ref 80.0–100.0)
Platelets: <5 10*3/uL — CL (ref 150–400)
RBC: 3.17 MIL/uL — ABNORMAL LOW (ref 3.87–5.11)
RDW: 16.1 % — ABNORMAL HIGH (ref 11.5–15.5)
WBC: 24.4 10*3/uL — ABNORMAL HIGH (ref 4.0–10.5)
nRBC: 0.1 % (ref 0.0–0.2)

## 2021-03-07 MED ORDER — ROMIPLOSTIM INJECTION 500 MCG
800.0000 ug | Freq: Once | SUBCUTANEOUS | Status: AC
  Administered 2021-03-08: 800 ug via SUBCUTANEOUS
  Filled 2021-03-07: qty 1.6
  Filled 2021-03-07: qty 1
  Filled 2021-03-07: qty 1.6

## 2021-03-07 MED ORDER — SODIUM CHLORIDE 0.9% IV SOLUTION: Freq: Once | INTRAVENOUS | Status: AC

## 2021-03-07 NOTE — Progress Notes (Addendum)
PROGRESS NOTE  Jean Kramer [id number] DOB: [birth year] DOA: 03/05/2021 PCP: [name], MD   LOS: 2 days   Brief narrative: Jean Kramer is a 70 y.o. 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  Patient also continued to have melanotic stools.   In the ED, patient was noted to have significant thrombocytopenia with platelet count of less than 5000.  Hemoglobin was around 13.0..  She was noted to have oozing from her knee and ongoing melena.  Compression bandage was placed.  Oncology was consulted from the ED and patient was admitted hospital for further evaluation and treatment.  During hospitalization, patient's platelet have been persistently less than 5K.  Oncology has seen the patient who recommended Nplate injection and has received 1 dose.  Has received platelet transfusion x2 for bleeding from the knee.  Otherwise has remained stable.  Will need to follow-up platelet counts closely and oncology recommendations  Assessment/Plan:  Bleeding from the knee after scab removal.  Improved after compression.  Continue to monitor closely.     Acute on chronic thrombocytopenia.  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  Oncology following.  Received Nplate injection.  Transfuse platelets if bleeding  History of recent GI bleed.  Melena.  Thought to be secondary to thrombocytopenia and use of NSAIDs.  Will transfuse 1 unit of platelets today.  High risk for any endoscopic intervention.  Continue Protonix for   history of COPD.  Counseling done on cessation of smoking.  Continue inhalers   History of stroke.  We will hold aspirin for now due to severe thrombocytopenia and bleeding   chronic CHF currently  compensated.  Monitor intake output charting and daily weights in the hospital.    History of hypothyroidism.  Continue Synthroid  Nicotine use disorder.  Does not wish nicotine patch  DVT prophylaxis:   None including SCD due to risk for bruising and spontaneous bleed.  Obesity.  Will benefit from weight loss as outpatient.  Code Status: Full code  Family Communication:  None today.  Spoke with the patient's son at bedside yesterday.  Status is: Inpatient  Remains inpatient appropriate because:IV treatments appropriate due to intensity of illness or inability to take PO and Inpatient level of care appropriate due to severity of illness  Dispo: The patient is from: Home              Anticipated d/c is to: Home              Patient currently is not medically stable to d/c.   Difficult to place patient No  Consultants: oncology  Procedures: Platelet transfusion  Anti-infectives:  None  Anti-infectives (From admission, onward)    None       Subjective: Today, patient was seen and examined at bedside.  Denies any further bleeding but still has melanotic stool.  Feels overwhelmed about her diagnosis.  Objective: Vitals:   03/07/21 0855 03/07/21 1000  BP: (!) 72/58 (!) 105/54  Pulse: 75 (!) 105  Resp: 18   Temp: 97.8 F (36.6 C) 98.3 F (36.8 C)  SpO2: 98% 98%    Intake/Output Summary (Last 24 hours) at 03/07/2021 1057 Last data filed at 03/06/2021 1305 Gross per 24 hour  Intake 610.75 ml  Output --  Net 610.75 ml    Filed Weights   03/07/21 0426  Weight: 83.6 kg   Body mass index is 33.69 kg/m.   Physical Exam:  GENERAL: Patient is alert awake and oriented. Not in obvious distress.  Mildly anxious and tearful at times. HENT: No scleral pallor or icterus. Pupils equally reactive to light. Oral mucosa is moist NECK: is supple, no gross swelling noted. CHEST: Clear to auscultation. No crackles or wheezes.  Diminished breath sounds bilaterally. CVS:  S1 and S2 heard, no murmur. Regular rate and rhythm.  ABDOMEN: Soft, non-tender, bowel sounds are present. EXTREMITIES: Bilateral lower extremity with petechial hemorrhage.  Right knee without any hemorrhage.  Bruises over the upper extremities CNS: Cranial nerves are intact. No focal motor deficits. SKIN: warm and dry, Petechia over the lower extremity, bruises  Data Review: I have personally reviewed the following laboratory data and studies,  CBC: Recent Labs  Lab 03/05/21 1441 03/05/21 2000 03/06/21 0353 03/06/21 1659 03/06/21 1958 03/07/21 0412  WBC 9.5 19.9* 13.5* 29.2* 28.3* 24.4*  NEUTROABS 5.5  --   --   --   --   --   HGB 13.0 12.6 11.6* 11.1* 10.7* 10.4*  HCT 39.8 39.3 35.6* 34.1* 32.5* 31.5*  MCV 99.7 101.6* 98.6 98.0 98.8 99.4  PLT <5* <5* <5* <5* <5* <5*    Basic Metabolic Panel: Recent Labs  Lab 03/05/21 1441 03/06/21 0350  NA 135 135  K 4.5 3.9  CL 104 100  CO2 22 22  GLUCOSE 103* 203*  BUN 15 17  CREATININE 0.81 0.95  CALCIUM 9.7 9.5    Liver Function Tests: Recent Labs  Lab 03/05/21 1441 03/06/21 0350  AST 82* 65*  ALT 63* 56*  ALKPHOS 76 63  BILITOT 0.6 0.6  PROT 7.4 6.8  ALBUMIN 3.1* 2.9*    No results for input(s): LIPASE, AMYLASE in the last 168 hours. No results for input(s): AMMONIA in the last 168 hours. Cardiac Enzymes: No results for input(s): CKTOTAL, CKMB, CKMBINDEX, TROPONINI in the last 168 hours. BNP (last 3 results) No results for input(s): BNP in the last 8760 hours.  ProBNP (last 3 results) No results for input(s): PROBNP in the last 8760 hours.  CBG: No results for input(s): GLUCAP in the last 168 hours. Recent Results (from the past 240 hour(s))  Resp Panel by RT-PCR (Flu A&B, Covid) Nasopharyngeal Swab     Status: None   Collection Time: 03/05/21  6:45 PM   Specimen: Nasopharyngeal Swab; Nasopharyngeal(NP) swabs in vial transport medium  Result Value Ref Range Status   SARS Coronavirus 2 by RT PCR NEGATIVE  NEGATIVE Final    Comment: (NOTE) SARS-CoV-2 target nucleic acids are NOT DETECTED.  The SARS-CoV-2 RNA is generally detectable in upper respiratory specimens during the acute phase of infection. The lowest concentration of SARS-CoV-2 viral copies this assay can detect is 138 copies/mL. A negative result does not preclude SARS-Cov-2 infection and should not be used as the sole basis for treatment or other patient management decisions. A negative result may occur with  improper specimen collection/handling, submission of specimen other than nasopharyngeal swab, presence of viral mutation(s) within the areas targeted by this assay, and inadequate number of viral copies(<138 copies/mL). A negative result must be combined with clinical observations, patient history, and epidemiological information. The expected result is Negative.  Fact Sheet for Patients:  EntrepreneurPulse.com.au  Fact Sheet for Healthcare Providers:  IncredibleEmployment.be  This test is no t yet approved  or cleared by the [place] and  has been authorized for detection and/or diagnosis of SARS-CoV-2 by FDA under an Emergency Use Authorization (EUA). This EUA will remain  in effect (meaning this test can be used) for the duration of the COVID-19 declaration under Section 564(b)(1) of the Act, 21 U.S.C.section 360bbb-3(b)(1), unless the authorization is terminated  or revoked sooner.       Influenza A by PCR NEGATIVE NEGATIVE Final   Influenza B by PCR NEGATIVE NEGATIVE Final    Comment: (NOTE) The Xpert Xpress SARS-CoV-2/FLU/RSV plus assay is intended as an aid in the diagnosis of influenza from Nasopharyngeal swab specimens and should not be used as a sole basis for treatment. Nasal washings and aspirates are unacceptable for Xpert Xpress SARS-CoV-2/FLU/RSV testing.  Fact Sheet for Patients: EntrepreneurPulse.com.au  Fact Sheet for Healthcare  Providers: IncredibleEmployment.be  This test is not yet approved or cleared by the [place] FDA and has been authorized for detection and/or diagnosis of SARS-CoV-2 by FDA under an Emergency Use Authorization (EUA). This EUA will remain in effect (meaning this test can be used) for the duration of the COVID-19 declaration under Section 564(b)(1) of the Act, 21 U.S.C. section 360bbb-3(b)(1), unless the authorization is terminated or revoked.  Performed at [facility], [place] [address] [place], [place] [address]       Studies: No results found.    Flora Lipps, MD  [facility] 03/07/2021  If 7PM-7AM, please contact night-coverage

## 2021-03-07 NOTE — Care Management Important Message (Signed)
Important Message  Patient Details  Name: Jean Kramer MRN: [id number] Date of Birth: July 03, 1951   Medicare Important Message Given:  Yes     [name] Mont[address] Circle 03/07/2021, 3:23 PM

## 2021-03-07 NOTE — Progress Notes (Addendum)
HEMATOLOGY-ONCOLOGY PROGRESS NOTE  SUBJECTIVE: Jean Kramer just  completed a unit of platelets.  She currently denies epistaxis, hemoptysis, hematemesis, hematuria, melena, hematochezia.  PHYSICAL EXAMINATION:  Vitals:   03/07/21 1000 03/07/21 1142  BP: (!) 105/54 94/63  Pulse: (!) 105 85  Resp:  18  Temp: 98.3 F (36.8 C) 97.8 F (36.6 C)  SpO2: 98%    Filed Weights   03/07/21 0426  Weight: 83.6 kg     General appearance: Comfortable appearing without any discomfort Head: Normocephalic without any trauma Oropharynx: Mucous membranes are moist and pink without any thrush or ulcers. Eyes: Pupils are equal and round reactive to light. Lymph nodes: No cervical, supraclavicular, inguinal or axillary lymphadenopathy.   Heart:regular rate and rhythm.  S1 and S2 without leg edema. Lung: Clear without any rhonchi or wheezes.  No dullness to percussion. Abdomen: Soft, nontender, nondistended with good bowel sounds.  No hepatosplenomegaly. Musculoskeletal: No joint deformity or effusion.  Full range of motion noted. Neurological: No deficits noted on motor, sensory and deep tendon reflex exam. Skin: Petechial rash noted on upper and lower extremities.  Ecchymosis noted on her upper extremities.  LABORATORY DATA:  I have reviewed the data as listed CMP Latest Ref Rng & Units 03/06/2021 03/05/2021 02/26/2021  Glucose 70 - 99 mg/dL 203(H) 103(H) -  BUN 8 - 23 mg/dL '17 15 19  '$ Creatinine 0.44 - 1.00 mg/dL 0.95 0.81 0.9  Sodium 135 - 145 mmol/L 135 135 137  Potassium 3.5 - 5.1 mmol/L 3.9 4.5 4.0  Chloride 98 - 111 mmol/L 100 104 102  CO2 22 - 32 mmol/L 22 22 27(A)  Calcium 8.9 - 10.3 mg/dL 9.5 9.7 9.4  Total Protein 6.5 - 8.1 g/dL 6.8 7.4 -  Total Bilirubin 0.3 - 1.2 mg/dL 0.6 0.6 -  Alkaline Phos 38 - 126 U/L 63 76 132(A)  AST 15 - 41 U/L 65(H) 82(H) 52(A)  ALT 0 - 44 U/L 56(H) 63(H) 58(A)    Lab Results  Component Value Date   WBC 24.4 (H) 03/07/2021   HGB 10.4 (L) 03/07/2021    HCT 31.5 (L) 03/07/2021   MCV 99.4 03/07/2021   PLT <5 (LL) 03/07/2021   NEUTROABS 5.5 03/05/2021     ASSESSMENT AND PLAN:  70 year old woman with:  1.  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  She is status post recent hospitalization where she required Nplate after being refractory to steroids and IVIG.  From a management standpoint, transfuse platelets for active bleeding only.  She received a dose of Nplate 10 mcg/kg on 624THL.  We will consider repeating on 03/08/2021  2.  Leukocytosis: Currently stable.  Monitor.  3.  History of recent GI bleed.  She remains on Protonix twice a day.  No bleeding reported at this time.   4.  Disposition: We will continue to follow during her hospitalization.   LOS: 2 days   Mikey Bussing 03/07/21  Events in the last 24 hours noted.  Patient remains clinically stable without any active bleeding.  Her platelet count continues to be low consistent with refractory ITP.  From a management standpoint, I recommended no further steroid use at this time given her poor response to it.  Platelet transfusion will be only recommended if active bleeding is noted.  I will repeat Nplate dosing on March 08, 2021.  We will continue to monitor labs daily.  If her platelet count starts to rise and no active bleeding is noted, she can  be discharged at that point.

## 2021-03-08 DIAGNOSIS — D693 Immune thrombocytopenic purpura: Secondary | ICD-10-CM | POA: Diagnosis not present

## 2021-03-08 DIAGNOSIS — Z9081 Acquired absence of spleen: Secondary | ICD-10-CM | POA: Diagnosis not present

## 2021-03-08 DIAGNOSIS — K922 Gastrointestinal hemorrhage, unspecified: Secondary | ICD-10-CM | POA: Diagnosis not present

## 2021-03-08 DIAGNOSIS — D696 Thrombocytopenia, unspecified: Secondary | ICD-10-CM | POA: Diagnosis not present

## 2021-03-08 DIAGNOSIS — Z79899 Other long term (current) drug therapy: Secondary | ICD-10-CM | POA: Diagnosis not present

## 2021-03-08 DIAGNOSIS — K921 Melena: Secondary | ICD-10-CM | POA: Diagnosis not present

## 2021-03-08 LAB — CBC
HCT: 32.6 % — ABNORMAL LOW (ref 36.0–46.0)
Hemoglobin: 10.7 g/dL — ABNORMAL LOW (ref 12.0–15.0)
MCH: 32.1 pg (ref 26.0–34.0)
MCHC: 32.8 g/dL (ref 30.0–36.0)
MCV: 97.9 fL (ref 80.0–100.0)
Platelets: 9 10*3/uL — CL (ref 150–400)
RBC: 3.33 MIL/uL — ABNORMAL LOW (ref 3.87–5.11)
RDW: 16.5 % — ABNORMAL HIGH (ref 11.5–15.5)
WBC: 14 10*3/uL — ABNORMAL HIGH (ref 4.0–10.5)
nRBC: 0.2 % (ref 0.0–0.2)

## 2021-03-08 LAB — PREPARE PLATELET PHERESIS: Unit division: 0

## 2021-03-08 LAB — BPAM PLATELET PHERESIS
Blood Product Expiration Date: 202208192359
ISSUE DATE / TIME: 202208190825
Unit Type and Rh: 7300

## 2021-03-08 NOTE — Progress Notes (Signed)
IP PROGRESS NOTE  Subjective:   No bleeding.  Objective: Vital signs in last 24 hours: Blood pressure 93/60, pulse 85, temperature 98.1 F (36.7 C), temperature source Oral, resp. rate 17, weight 186 lb 4.6 oz (84.5 kg), SpO2 96 %.  Intake/Output from previous day: 08/19 0701 - 08/20 0700 In: 978 [I.V.:150; Blood:828] Out: -   Physical Exam:  HEENT: Single petechiae at the right buccal mucosa Lungs: Clear bilaterally Cardiac: Regular rate and rhythm Abdomen: No hepatomegaly Extremities: No leg edema Skin: Petechiae at the forearms and lower legs, scattered ecchymoses over the arms   Lab Results: Recent Labs    03/07/21 0412 03/08/21 0022  WBC 24.4* 14.0*  HGB 10.4* 10.7*  HCT 31.5* 32.6*  PLT <5* 9*    BMET Recent Labs    03/05/21 1441 03/06/21 0350  NA 135 135  K 4.5 3.9  CL 104 100  CO2 22 22  GLUCOSE 103* 203*  BUN 15 17  CREATININE 0.81 0.95  CALCIUM 9.7 9.5    No results found for: CEA1, CEA, CAN199, CA125  Studies/Results: No results found.  Medications: I have reviewed the patient's current medications.  Assessment/Plan:  Chronic ITP with an acute exacerbation, status post Nplate 03/05/2021 Leukocytosis secondary to splenectomy and steroids COPD Hypothyroidism History of a CVA  The platelet count is higher today.  There is no apparent bleeding.  No indication for platelet transfusion at present.  Dr. Alen Blew has ordered another dose of Nplate for today.  She reports undergoing a splenectomy in the past.  I recommend outpatient rituximab if she has not received this.  Recommendations: Nplate today Check platelet count 03/09/2021 Discharge to home for platelet count of greater than 20,000, outpatient follow-up with Dr. Hinton Rao   LOS: 3 days   Betsy Coder, MD   03/08/2021, 7:00 AM

## 2021-03-08 NOTE — Progress Notes (Addendum)
PROGRESS NOTE  Jean Kramer [id number] DOB: 01/21/1951 DOA: 03/05/2021 PCP: [name], MD   LOS: 3 days   Brief narrative: Jean Kramer is a 70 y.o. female with past medical history of idiopathic thrombocytopenic purpura status post splenectomy in the past, recent admission on 02/05/2021 for hemorrhagic shock secondary to upper GI bleed, presented to hospital with ongoing oozing of blood from her knee after removal of the scab.  Patient also continued to have melanotic stools.   In the ED, patient was noted to have significant thrombocytopenia with platelet count of less than 5000.  Hemoglobin was around 13.0.  Compression bandage was placed on the knee..  Oncology was consulted from the ED and patient was admitted hospital for further evaluation and treatment.  During hospitalization, patient has received Nplate injection x1, will be repeated today..  She has received 2 units of packed RBC for thrombocytopenia with bleeding.  Oncology following.  Plan for discharge home if platelet count is more than 20,000.  Assessment/Plan:  Bleeding from the knee after scab removal.   ceased after compression and platelet transfusion/Solu-Medrol/Nplate.     Acute on chronic thrombocytopenia.  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  Oncology following.  Received Nplate injection and will be repeated today.  Transfuse platelets if bleeding.  Plan for discharge home if platelet count is more than 20,000  History of recent GI bleed.  Melena.  Thought to be secondary to thrombocytopenia and use of NSAIDs.  Will transfuse 1 unit of platelets today.  High risk for any endoscopic intervention.  Continue Protonix  history of COPD.  Compensated.  Continue inhalers   History of stroke.  Aspirin on hold due to severe thrombocytopenia and bleeding  Chronic CHF as per the records.  undetermined at this time.  Was on midodrine as outpatient.  Recent  echocardiogram.  We will check 2D echocardiogram, currently compensated.  Monitor intake output charting and daily weights in the hospital.    History of hypothyroidism.  Continue Synthroid  Nicotine use disorder.  Does not wish nicotine patch  DVT prophylaxis:   None including SCD due to risk for bruising and spontaneous bleed.  Obesity.  Will benefit from weight loss as outpatient.  Code Status: Full code  Family Communication:  None today.  Spoke with the patient's son 03/06/2021  Status is: Inpatient  Remains inpatient appropriate because:IV treatments appropriate due to intensity of illness or inability to take PO and Inpatient level of care appropriate due to severity of illness, severe thrombocytopenia requiring observation and Nplate injection  Dispo: The patient is from: Home              Anticipated d/c is to: Home              Patient currently is not medically stable to d/c.   Difficult to place patient No  Consultants: oncology  Procedures: Platelet transfusion  Anti-infectives:  None  Anti-infectives (From admission, onward)    None       Subjective: Today, patient was seen and examined at bedside.  Denies any further bleed.  Feels anxious about diagnosis.    Objective: Vitals:   03/08/21 0812 03/08/21 0900  BP: (!) 61/28 107/73  Pulse: 78   Resp: 19 20  Temp: 98.1 F (36.7 C)   SpO2: 97%     Intake/Output Summary (Last 24 hours) at 03/08/2021 1057 Last data filed at 03/08/2021 0900 Gross per 24 hour  Intake 1218 ml  Output --  Net 1218 ml    Filed Weights   03/07/21 0426 03/08/21 0341  Weight: 83.6 kg 84.5 kg   Body mass index is 34.07 kg/m.   Physical Exam:  General: Obese built, not in obvious distress, anxious  HENT:   No scleral pallor or icterus noted. Oral mucosa is moist.  Single petechiae in the right buccal mucosa Chest:  Clear breath sounds.  Diminished breath sounds bilaterally. No crackles or wheezes.  CVS: S1 &S2  heard. No murmur.  Regular rate and rhythm. Abdomen: Soft, nontender, nondistended.  Bowel sounds are heard.   Extremities: Petechiae at the forearms and lower legs with bruises over the arms. Psych: Alert, awake and oriented, normal mood CNS:  No cranial nerve deficits.  Power equal in all extremities.   Skin: Warm and dry.  No rashes noted.   Data Review: I have personally reviewed the following laboratory data and studies,  CBC: Recent Labs  Lab 03/05/21 1441 03/05/21 2000 03/06/21 0353 03/06/21 1659 03/06/21 1958 03/07/21 0412 03/08/21 0022  WBC 9.5   < > 13.5* 29.2* 28.3* 24.4* 14.0*  NEUTROABS 5.5  --   --   --   --   --   --   HGB 13.0   < > 11.6* 11.1* 10.7* 10.4* 10.7*  HCT 39.8   < > 35.6* 34.1* 32.5* 31.5* 32.6*  MCV 99.7   < > 98.6 98.0 98.8 99.4 97.9  PLT <5*   < > <5* <5* <5* <5* 9*   < > = values in this interval not displayed.    Basic Metabolic Panel: Recent Labs  Lab 03/05/21 1441 03/06/21 0350  NA 135 135  K 4.5 3.9  CL 104 100  CO2 22 22  GLUCOSE 103* 203*  BUN 15 17  CREATININE 0.81 0.95  CALCIUM 9.7 9.5    Liver Function Tests: Recent Labs  Lab 03/05/21 1441 03/06/21 0350  AST 82* 65*  ALT 63* 56*  ALKPHOS 76 63  BILITOT 0.6 0.6  PROT 7.4 6.8  ALBUMIN 3.1* 2.9*    No results for input(s): LIPASE, AMYLASE in the last 168 hours. No results for input(s): AMMONIA in the last 168 hours. Cardiac Enzymes: No results for input(s): CKTOTAL, CKMB, CKMBINDEX, TROPONINI in the last 168 hours. BNP (last 3 results) No results for input(s): BNP in the last 8760 hours.  ProBNP (last 3 results) No results for input(s): PROBNP in the last 8760 hours.  CBG: No results for input(s): GLUCAP in the last 168 hours. Recent Results (from the past 240 hour(s))  Resp Panel by RT-PCR (Flu A&B, Covid) Nasopharyngeal Swab     Status: None   Collection Time: 03/05/21  6:45 PM   Specimen: Nasopharyngeal Swab; Nasopharyngeal(NP) swabs in vial transport  medium  Result Value Ref Range Status   SARS Coronavirus 2 by RT PCR NEGATIVE NEGATIVE Final    Comment: (NOTE) SARS-CoV-2 target nucleic acids are NOT DETECTED.  The SARS-CoV-2 RNA is generally detectable in upper respiratory specimens during the acute phase of infection. The lowest concentration of SARS-CoV-2 viral copies this assay can detect is 138 copies/mL. A negative result does not preclude SARS-Cov-2 infection and should not be used as the sole basis for treatment or other patient management decisions. A negative result may occur with  improper specimen collection/handling, submission of specimen other than nasopharyngeal swab, presence of viral mutation(s) within the areas targeted by this assay, and inadequate number of viral  copies(<138 copies/mL). A negative result must be combined with clinical observations, patient history, and epidemiological information. The expected result is Negative.  Fact Sheet for Patients:  EntrepreneurPulse.com.au  Fact Sheet for Healthcare Providers:  IncredibleEmployment.be  This test is no t yet approved or cleared by the [place] FDA and  has been authorized for detection and/or diagnosis of SARS-CoV-2 by FDA under an Emergency Use Authorization (EUA). This EUA will remain  in effect (meaning this test can be used) for the duration of the COVID-19 declaration under Section 564(b)(1) of the Act, 21 U.S.C.section 360bbb-3(b)(1), unless the authorization is terminated  or revoked sooner.       Influenza A by PCR NEGATIVE NEGATIVE Final   Influenza B by PCR NEGATIVE NEGATIVE Final    Comment: (NOTE) The Xpert Xpress SARS-CoV-2/FLU/RSV plus assay is intended as an aid in the diagnosis of influenza from Nasopharyngeal swab specimens and should not be used as a sole basis for treatment. Nasal washings and aspirates are unacceptable for Xpert Xpress SARS-CoV-2/FLU/RSV testing.  Fact Sheet for  Patients: EntrepreneurPulse.com.au  Fact Sheet for Healthcare Providers: IncredibleEmployment.be  This test is not yet approved or cleared by the [place] FDA and has been authorized for detection and/or diagnosis of SARS-CoV-2 by FDA under an Emergency Use Authorization (EUA). This EUA will remain in effect (meaning this test can be used) for the duration of the COVID-19 declaration under Section 564(b)(1) of the Act, 21 U.S.C. section 360bbb-3(b)(1), unless the authorization is terminated or revoked.  Performed at [facility], [place] [address], [place], [place] [address]       Studies: No results found.   Flora Lipps, MD  [facility] 03/08/2021  If 7PM-7AM, please contact night-coverage

## 2021-03-09 DIAGNOSIS — D693 Immune thrombocytopenic purpura: Secondary | ICD-10-CM | POA: Diagnosis not present

## 2021-03-09 DIAGNOSIS — Z79899 Other long term (current) drug therapy: Secondary | ICD-10-CM | POA: Diagnosis not present

## 2021-03-09 DIAGNOSIS — D696 Thrombocytopenia, unspecified: Secondary | ICD-10-CM | POA: Diagnosis not present

## 2021-03-09 DIAGNOSIS — K922 Gastrointestinal hemorrhage, unspecified: Secondary | ICD-10-CM | POA: Diagnosis not present

## 2021-03-09 LAB — CBC
HCT: 35.2 % — ABNORMAL LOW (ref 36.0–46.0)
Hemoglobin: 11.5 g/dL — ABNORMAL LOW (ref 12.0–15.0)
MCH: 32.4 pg (ref 26.0–34.0)
MCHC: 32.7 g/dL (ref 30.0–36.0)
MCV: 99.2 fL (ref 80.0–100.0)
Platelets: 17 10*3/uL — CL (ref 150–400)
RBC: 3.55 MIL/uL — ABNORMAL LOW (ref 3.87–5.11)
RDW: 16.3 % — ABNORMAL HIGH (ref 11.5–15.5)
WBC: 12.4 10*3/uL — ABNORMAL HIGH (ref 4.0–10.5)
nRBC: 0.5 % — ABNORMAL HIGH (ref 0.0–0.2)

## 2021-03-09 NOTE — TOC Transition Note (Signed)
Transition of Care [facility]) - CM/SW Discharge Note   Patient Details  Name: Jean Kramer MRN: [id number] Date of Birth: August 09, 1950  [facility] CM/SW Contact:  Carles Collet, RN Phone Number: 03/09/2021, 11:24 AM   Clinical Narrative:    Patient currently active w [facility] for [facility] PT. Resumption order placed and liaison notified.     Final next level of care: Home w Home Health Services Barriers to Discharge: No Barriers Identified   Patient Goals and CMS Choice        Discharge Placement                       Discharge Plan and Services                          HH Arranged: PT HH Agency: [facility] Date [place]: 03/09/21 Time [address]: [id number] Representative spoke with at [place]: [place] Determinants of Health ([place]) Interventions     Readmission Risk Interventions No flowsheet data found.

## 2021-03-09 NOTE — Progress Notes (Signed)
IP PROGRESS NOTE  Subjective:   No bleeding.  No complaint  Objective: Vital signs in last 24 hours: Blood pressure (!) 82/59, pulse 85, temperature 98 F (36.7 C), temperature source Oral, resp. rate 17, weight 183 lb 10.3 oz (83.3 kg), SpO2 97 %.  Intake/Output from previous day: 08/20 0701 - 08/21 0700 In: 240 [P.O.:240] Out: -   Physical Exam:  HEENT: Mouth without bleeding or thrush  Abdomen: No hepatomegaly Extremities: No leg edema Skin: Petechiae at the forearms and lower legs, scattered ecchymoses over the arms   Lab Results: Recent Labs    03/08/21 0022 03/09/21 0014  WBC 14.0* 12.4*  HGB 10.7* 11.5*  HCT 32.6* 35.2*  PLT 9* 17*    Medications: I have reviewed the patient's current medications.  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  She appears stable for discharge.  She can follow-up with Dr. Hinton Rao as an outpatient next week.  She can continue weekly Nplate as an outpatient and transition to [facility].  Consider course of rituximab if she has not received this in the past.  Recommendations: Okay for discharge from a hematology standpoint Repeat CBC at the [facility] cancer center early this week, outpatient follow-up with Dr. Hinton Rao    LOS: 4 days   Betsy Coder, MD   03/09/2021, 7:50 AM

## 2021-03-09 NOTE — Discharge Summary (Signed)
Physician Discharge Summary  Jean Kramer [id number] DOB: May 31, 1951 DOA: 03/05/2021  PCP: [name], MD  Admit date: 03/05/2021 Discharge date: 03/09/2021  Admitted From: Home Disposition: Home  Recommendations for Outpatient Follow-up:  Follow up with PCP in 1-2 weeks Please obtain BMP/CBC in one week   Home Health: Not applicable Equipment/Devices: Not applicable  Discharge Condition: Stable CODE STATUS: Full code Diet recommendation: Regular diet  Discharge summary:  70 year old female with history of chronic ITP status post splenectomy in the past, recent admission to hospital for hemorrhagic shock secondary to upper GI bleeding presented to the emergency room with ongoing oozing of blood from her knee skin after removal of a scab.  She also complained of melanotic stools.  In the emergency room platelet count is less than 5000.  Hemoglobin 13.  Compression bandage was done for the knee bleeding.  Oncology was consulted.  She was admitted to the hospital due to severe thrombocytopenia with acute on chronic ITP.  Seen and followed by hematology.  Received Nplate injection x2.  2 units of packed RBC for thrombocytopenia with bleeding.  Symptomatically improved.  1 unit of platelets.  Platelets gradually improved and now on 17,000 today.  No evidence of active bleeding.  As per hematology plan, she would be discharged home with a scheduled follow-up.   Discharge Diagnoses:  Active Problems:   Melena   History of splenectomy   Chronic ITP (idiopathic thrombocytopenia) (HCC)   Severe thrombocytopenia (HCC)    Discharge Instructions  Discharge Instructions     Call MD for:   Complete by: As directed    Weakness, bleeding   Call MD for:  extreme fatigue   Complete by: As directed    Diet - low sodium heart healthy   Complete by: As directed    Increase activity slowly   Complete by: As directed    No wound care   Complete by: As directed        Allergies as of 03/09/2021   No Known Allergies      Medication List     STOP taking these medications    aspirin 81 MG EC tablet       TAKE these medications    acetaminophen 500 MG tablet Commonly known as: TYLENOL Take 1,000 mg by mouth every 6 (six) hours as needed for mild pain.   alendronate 70 MG tablet Commonly known as: FOSAMAX Take 70 mg by mouth once a week. Friday   ALPRAZolam 1 MG tablet Commonly known as: XANAX Take 0.5-1 mg by mouth daily as needed for anxiety.   budesonide-formoterol 160-4.5 MCG/ACT inhaler Commonly known as: SYMBICORT Inhale 2 puffs into the lungs 2 (two) times daily as needed (shortness of breath).   busPIRone 15 MG tablet Commonly known as: BUSPAR Take 15 mg by mouth 2 (two) times daily.   calcium-vitamin D 500-200 MG-UNIT Tabs tablet Commonly known as: OSCAL WITH D Take 1 tablet by mouth 2 (two) times daily with a meal.   furosemide 20 MG tablet Commonly known as: LASIX Take 20 mg by mouth daily.   ipratropium-albuterol 0.5-2.5 (3) MG/3ML Soln Commonly known as: DUONEB Inhale 3 mLs into the lungs every 6 (six) hours as needed for shortness of breath.   levothyroxine 25 MCG tablet Commonly known as: SYNTHROID Take 25 mcg by mouth every morning.   Linzess 290 MCG Caps capsule Generic drug: linaclotide Take 290 mcg by mouth daily as needed (constipation).   midodrine 10 MG tablet  Commonly known as: PROAMATINE Take 1 tablet (10 mg total) by mouth 3 (three) times daily with meals.   Omega-3 1000 MG Caps Take 2 capsules by mouth daily.   Oxycodone HCl 20 MG Tabs Take 20 mg by mouth every 8 (eight) hours as needed (pain).   pantoprazole 40 MG tablet Commonly known as: PROTONIX Take 1 tablet (40 mg total) by mouth 2 (two) times daily.   pramipexole 0.5 MG tablet Commonly known as: MIRAPEX Take 0.5 mg by mouth at bedtime.   QUEtiapine 200 MG tablet Commonly known as: SEROQUEL Take 200 mg by mouth at bedtime.    rOPINIRole 0.5 MG tablet Commonly known as: REQUIP Take 0.5 mg by mouth 3 (three) times daily.        Follow-up Information     [name], MD Follow up in 1 week(s).   Specialty: Oncology Why: repeat CBC Contact information: [place]. [place] [address] [phone]                No Known Allergies  Consultations: Hematology   Procedures/Studies: DG CHEST PORT 1 VIEW  Result Date: 02/15/2021 CLINICAL DATA:  Weakness. EXAM: PORTABLE CHEST 1 VIEW COMPARISON:  Chest radiograph 02/09/2021. FINDINGS: Patient is mildly rotated. Normal cardiac and mediastinal contours. No large area of pulmonary consolidation. No pleural effusion or pneumothorax. IMPRESSION: No acute cardiopulmonary process. Electronically Signed   By: Lovey Newcomer M.D.   On: 02/15/2021 15:38   DG CHEST PORT 1 VIEW  Result Date: 02/09/2021 CLINICAL DATA:  Wheezing.  History of COPD. EXAM: PORTABLE CHEST 1 VIEW COMPARISON:  02/08/2021 FINDINGS: Cardiac silhouette is normal in size. No mediastinal or hilar masses. There is hazy opacity at the right lung base partly silhouetting the lateral right hemidiaphragm. Remainder of the lungs is clear. Possible right pleural effusion. No convincing left pleural effusion. No pneumothorax. Skeletal structures are grossly intact. IMPRESSION: 1. No definite change from the previous day's study allowing for differences in patient positioning and technique. 2. Opacity at the right lung base is likely a combination of a small effusion and atelectasis. Pneumonia should be considered if there are consistent clinical findings. 3. No evidence of pulmonary edema. Electronically Signed   By: Lajean Manes M.D.   On: 02/09/2021 16:00   DG CHEST PORT 1 VIEW  Result Date: 02/08/2021 CLINICAL DATA:  Cough, weakness. EXAM: PORTABLE CHEST 1 VIEW COMPARISON:  Chest radiograph dated 09/20/2007. FINDINGS: The heart size is normal. Vascular calcifications are seen in the  aortic arch. There are mild bilateral lower lung predominant interstitial and airspace opacities. A small right pleural effusion may contribute. There is no left pleural effusion. There is no pneumothorax on either side. No acute osseous injury. IMPRESSION: Mild bilateral lower lung predominant interstitial and airspace opacities may represent pulmonary edema and/or pneumonia. A small right pleural effusion may contribute. Electronically Signed   By: Zerita Boers M.D.   On: 02/08/2021 17:34   (Echo, Carotid, EGD, Colonoscopy, ERCP)    Subjective: Patient seen and examined.  No overnight events.  No active bleeding.  She is bruised everywhere.  Wants to go home.   Discharge Exam: Vitals:   03/09/21 0519 03/09/21 0807  BP: (!) 82/59 (!) 107/92  Pulse: 85 93  Resp: 17 19  Temp: 98 F (36.7 C) 98.2 F (36.8 C)  SpO2: 97% 96%   Vitals:   03/09/21 0020 03/09/21 0500 03/09/21 0519 03/09/21 0807  BP: 97/70  (!) 82/59 (!) 107/92  Pulse: 89  85 93  Resp: '16  17 19  '$ Temp: 98.2 F (36.8 C)  98 F (36.7 C) 98.2 F (36.8 C)  TempSrc: Oral  Oral Oral  SpO2: 95%  97% 96%  Weight:  83.3 kg      General: Pt is alert, awake, not in acute distress Cardiovascular: RRR, S1/S2 +, no rubs, no gallops Respiratory: CTA bilaterally, no wheezing, no rhonchi Abdominal: Soft, NT, ND, bowel sounds + Extremities: no edema, no cyanosis Patient has extensive areas of ecchymosis.  No active bleeding.    The results of significant diagnostics from this hospitalization (including imaging, microbiology, ancillary and laboratory) are listed below for reference.     Microbiology: Recent Results (from the past 240 hour(s))  Resp Panel by RT-PCR (Flu A&B, Covid) Nasopharyngeal Swab     Status: None   Collection Time: 03/05/21  6:45 PM   Specimen: Nasopharyngeal Swab; Nasopharyngeal(NP) swabs in vial transport medium  Result Value Ref Range Status   SARS Coronavirus 2 by RT PCR NEGATIVE NEGATIVE Final     Comment: (NOTE) SARS-CoV-2 target nucleic acids are NOT DETECTED.  The SARS-CoV-2 RNA is generally detectable in upper respiratory specimens during the acute phase of infection. The lowest concentration of SARS-CoV-2 viral copies this assay can detect is 138 copies/mL. A negative result does not preclude SARS-Cov-2 infection and should not be used as the sole basis for treatment or other patient management decisions. A negative result may occur with  improper specimen collection/handling, submission of specimen other than nasopharyngeal swab, presence of viral mutation(s) within the areas targeted by this assay, and inadequate number of viral copies(<138 copies/mL). A negative result must be combined with clinical observations, patient history, and epidemiological information. The expected result is Negative.  Fact Sheet for Patients:  EntrepreneurPulse.com.au  Fact Sheet for Healthcare Providers:  IncredibleEmployment.be  This test is no t yet approved or cleared by the [place] FDA and  has been authorized for detection and/or diagnosis of SARS-CoV-2 by FDA under an Emergency Use Authorization (EUA). This EUA will remain  in effect (meaning this test can be used) for the duration of the COVID-19 declaration under Section 564(b)(1) of the Act, 21 U.S.C.section 360bbb-3(b)(1), unless the authorization is terminated  or revoked sooner.       Influenza A by PCR NEGATIVE NEGATIVE Final   Influenza B by PCR NEGATIVE NEGATIVE Final    Comment: (NOTE) The Xpert Xpress SARS-CoV-2/FLU/RSV plus assay is intended as an aid in the diagnosis of influenza from Nasopharyngeal swab specimens and should not be used as a sole basis for treatment. Nasal washings and aspirates are unacceptable for Xpert Xpress SARS-CoV-2/FLU/RSV testing.  Fact Sheet for Patients: EntrepreneurPulse.com.au  Fact Sheet for Healthcare  Providers: IncredibleEmployment.be  This test is not yet approved or cleared by the [place] FDA and has been authorized for detection and/or diagnosis of SARS-CoV-2 by FDA under an Emergency Use Authorization (EUA). This EUA will remain in effect (meaning this test can be used) for the duration of the COVID-19 declaration under Section 564(b)(1) of the Act, 21 U.S.C. section 360bbb-3(b)(1), unless the authorization is terminated or revoked.  Performed at [facility], [place] [address], [place], [place] [address]      Labs: BNP (last 3 results) No results for input(s): BNP in the last 8760 hours. Basic Metabolic Panel: Recent Labs  Lab 03/05/21 1441 03/06/21 0350  NA 135 135  K 4.5 3.9  CL 104 100  CO2 22  22  GLUCOSE 103* 203*  BUN 15 17  CREATININE 0.81 0.95  CALCIUM 9.7 9.5   Liver Function Tests: Recent Labs  Lab 03/05/21 1441 03/06/21 0350  AST 82* 65*  ALT 63* 56*  ALKPHOS 76 63  BILITOT 0.6 0.6  PROT 7.4 6.8  ALBUMIN 3.1* 2.9*   No results for input(s): LIPASE, AMYLASE in the last 168 hours. No results for input(s): AMMONIA in the last 168 hours. CBC: Recent Labs  Lab 03/05/21 1441 03/05/21 2000 03/06/21 1659 03/06/21 1958 03/07/21 0412 03/08/21 0022 03/09/21 0014  WBC 9.5   < > 29.2* 28.3* 24.4* 14.0* 12.4*  NEUTROABS 5.5  --   --   --   --   --   --   HGB 13.0   < > 11.1* 10.7* 10.4* 10.7* 11.5*  HCT 39.8   < > 34.1* 32.5* 31.5* 32.6* 35.2*  MCV 99.7   < > 98.0 98.8 99.4 97.9 99.2  PLT <5*   < > <5* <5* <5* 9* 17*   < > = values in this interval not displayed.   Cardiac Enzymes: No results for input(s): CKTOTAL, CKMB, CKMBINDEX, TROPONINI in the last 168 hours. BNP: Invalid input(s): POCBNP CBG: No results for input(s): GLUCAP in the last 168 hours. D-Dimer No results for input(s): DDIMER in the last 72 hours. Hgb A1c No results for input(s): HGBA1C in the last 72 hours. Lipid Profile No results for  input(s): CHOL, HDL, LDLCALC, TRIG, CHOLHDL, LDLDIRECT in the last 72 hours. Thyroid function studies No results for input(s): TSH, T4TOTAL, T3FREE, THYROIDAB in the last 72 hours.  Invalid input(s): FREET3 Anemia work up No results for input(s): VITAMINB12, FOLATE, FERRITIN, TIBC, IRON, RETICCTPCT in the last 72 hours. Urinalysis    Component Value Date/Time   COLORURINE YELLOW 02/15/2021 1434   APPEARANCEUR HAZY (A) 02/15/2021 1434   LABSPEC 1.028 02/15/2021 1434   PHURINE 5.0 02/15/2021 1434   GLUCOSEU NEGATIVE 02/15/2021 1434   HGBUR NEGATIVE 02/15/2021 1434   BILIRUBINUR NEGATIVE 02/15/2021 1434   KETONESUR NEGATIVE 02/15/2021 1434   PROTEINUR NEGATIVE 02/15/2021 1434   NITRITE NEGATIVE 02/15/2021 1434   LEUKOCYTESUR LARGE (A) 02/15/2021 1434   Sepsis Labs Invalid input(s): PROCALCITONIN,  WBC,  LACTICIDVEN Microbiology Recent Results (from the past 240 hour(s))  Resp Panel by RT-PCR (Flu A&B, Covid) Nasopharyngeal Swab     Status: None   Collection Time: 03/05/21  6:45 PM   Specimen: Nasopharyngeal Swab; Nasopharyngeal(NP) swabs in vial transport medium  Result Value Ref Range Status   SARS Coronavirus 2 by RT PCR NEGATIVE NEGATIVE Final    Comment: (NOTE) SARS-CoV-2 target nucleic acids are NOT DETECTED.  The SARS-CoV-2 RNA is generally detectable in upper respiratory specimens during the acute phase of infection. The lowest concentration of SARS-CoV-2 viral copies this assay can detect is 138 copies/mL. A negative result does not preclude SARS-Cov-2 infection and should not be used as the sole basis for treatment or other patient management decisions. A negative result may occur with  improper specimen collection/handling, submission of specimen other than nasopharyngeal swab, presence of viral mutation(s) within the areas targeted by this assay, and inadequate number of viral copies(<138 copies/mL). A negative result must be combined with clinical observations,  patient history, and epidemiological information. The expected result is Negative.  Fact Sheet for Patients:  EntrepreneurPulse.com.au  Fact Sheet for Healthcare Providers:  IncredibleEmployment.be  This test is no t yet approved or cleared by the [place] FDA and  has  been authorized for detection and/or diagnosis of SARS-CoV-2 by FDA under an Emergency Use Authorization (EUA). This EUA will remain  in effect (meaning this test can be used) for the duration of the COVID-19 declaration under Section 564(b)(1) of the Act, 21 U.S.C.section 360bbb-3(b)(1), unless the authorization is terminated  or revoked sooner.       Influenza A by PCR NEGATIVE NEGATIVE Final   Influenza B by PCR NEGATIVE NEGATIVE Final    Comment: (NOTE) The Xpert Xpress SARS-CoV-2/FLU/RSV plus assay is intended as an aid in the diagnosis of influenza from Nasopharyngeal swab specimens and should not be used as a sole basis for treatment. Nasal washings and aspirates are unacceptable for Xpert Xpress SARS-CoV-2/FLU/RSV testing.  Fact Sheet for Patients: EntrepreneurPulse.com.au  Fact Sheet for Healthcare Providers: IncredibleEmployment.be  This test is not yet approved or cleared by the [place] FDA and has been authorized for detection and/or diagnosis of SARS-CoV-2 by FDA under an Emergency Use Authorization (EUA). This EUA will remain in effect (meaning this test can be used) for the duration of the COVID-19 declaration under Section 564(b)(1) of the Act, 21 U.S.C. section 360bbb-3(b)(1), unless the authorization is terminated or revoked.  Performed at [facility], [place] [address], [place], [place] [address]      Time coordinating discharge:  32 minutes  SIGNED:   Barb Merino, MD  [facility] 03/09/2021, 11:10 AM

## 2021-03-10 ENCOUNTER — Inpatient Hospital Stay: Payer: Medicare Other

## 2021-03-10 ENCOUNTER — Telehealth: Payer: Self-pay | Admitting: Oncology

## 2021-03-10 ENCOUNTER — Other Ambulatory Visit: Payer: Self-pay

## 2021-03-10 ENCOUNTER — Encounter: Payer: Self-pay | Admitting: Oncology

## 2021-03-10 ENCOUNTER — Other Ambulatory Visit: Payer: Self-pay | Admitting: Oncology

## 2021-03-10 ENCOUNTER — Inpatient Hospital Stay (INDEPENDENT_AMBULATORY_CARE_PROVIDER_SITE_OTHER): Payer: Medicare Other | Admitting: Oncology

## 2021-03-10 VITALS — BP 124/79 | HR 90 | Temp 97.3°F | Resp 20 | Ht 62.0 in | Wt 174.3 lb

## 2021-03-10 DIAGNOSIS — D693 Immune thrombocytopenic purpura: Secondary | ICD-10-CM

## 2021-03-10 DIAGNOSIS — Z1239 Encounter for other screening for malignant neoplasm of breast: Secondary | ICD-10-CM

## 2021-03-10 LAB — BASIC METABOLIC PANEL
BUN: 19 (ref 4–21)
CO2: 26 — AB (ref 13–22)
Chloride: 103 (ref 99–108)
Creatinine: 0.9 (ref 0.5–1.1)
Glucose: 103
Potassium: 4.3 (ref 3.4–5.3)
Sodium: 140 (ref 137–147)

## 2021-03-10 LAB — CBC AND DIFFERENTIAL
HCT: 38 (ref 36–46)
Hemoglobin: 12.7 (ref 12.0–16.0)
Neutrophils Absolute: 6.8
Platelets: 41 — AB (ref 150–399)
WBC: 10.8

## 2021-03-10 LAB — COMPREHENSIVE METABOLIC PANEL
Albumin: 4.2 (ref 3.5–5.0)
Calcium: 9.6 (ref 8.7–10.7)

## 2021-03-10 LAB — HEPATIC FUNCTION PANEL
ALT: 90 — AB (ref 7–35)
AST: 117 — AB (ref 13–35)
Alkaline Phosphatase: 134 — AB (ref 25–125)
Bilirubin, Total: 0.3

## 2021-03-10 LAB — CBC: RBC: 3.86 — AB (ref 3.87–5.11)

## 2021-03-10 MED ORDER — PREDNISONE 20 MG PO TABS: 20.0000 mg | ORAL_TABLET | Freq: Three times a day (TID) | ORAL | 1 refills | Status: DC

## 2021-03-10 NOTE — Progress Notes (Signed)
[name]  [address] [place],  [place]  [address] [phone]  Clinic Day:  03/10/2021  Referring physician: [name], MD  This document serves as a record of services personally performed by [name], MD. It was created on their behalf by Curry,[name], a trained medical scribe. The creation of this record is based on the scribe's personal observations and the provider's statements to them.  CHIEF COMPLAINT:  CC: History of ITP  Current Treatment:  Surveillance   HISTORY OF PRESENT ILLNESS:  Jean Kramer is a 70 y.o. 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 She responded to that but over time has been on and off corticosteroids for relapsing ITP, and was treated with danazol for a time.  Her ITP has currently been in remission for many years but she does have a chronic leukocytosis.  She started smoking at age 89 and so she has been smoking approximately 1 pack per day for 49 years and continues to smoke despite numerous discussions about smoking cessation.  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  We recommended an ultrasound-guided biopsy and this revealed fibroadenomatoid and fibrocystic changes including cystic and micro papillary apocrine metaplasia, apocrine adenosis, stromal fibrosis, and foci of pseudo angiomatous stromal hyperplasia and focal sclerosis.  It was recommended that she have excision of this area and that was performed in July by Dr. Melynda Ripple.  The final pathology revealed atrophic breast tissue with fibrocystic changes and foci of usual ductal hyperplasia but no intraductal papilloma, fibroadenoma,  atypia or malignancy.  There was another lumpectomy sample which revealed micro papillary ductal hyperplasia, focal ectatic ducts, and patchy peri ductal chronic inflammation.  The other area of lumpectomy had morphologic changes similar to the original biopsy.    She was released from [facility] hospital in July 2022 due to developing an upper GI bleed from taking BC powders.  I had advised her to discontinue these previously.  She is currently taking Tylenol for her pain.  While in the hospital her platelet count dropped into the teens, and she was given steroids and IVIG.   She has also noticed that she was covered in bruises. She was in the hospital for a total of 15 days and her platelet count had gone up to 63,000 at the time of discharge.  She was given a prescription for prednisone at the time of discharge but I advised her not to take this.  When I saw her in early August her platelet count was up to 168,000.  She has a chronic leukocytosis and her white count was 14,000.  Her bruises had resolved and she was doing well.  INTERVAL HISTORY:  Jean Kramer is here for follow up after being admitted to [facility] on August 17th due to severe thrombocytopenia with a platelet count less than 5,000.  The patient had removed a scab of the knee, and the wound continued to ooze blood.  She received Nplate injection x2, as well as 2 units of PRBCs and 1 unit of platelets.  Upon discharge her platelet count had improved to 17,000.  She was  advised to discontinue aspirin 81 mg.  She notes extensive bruising as well as shortness of breath with exertion, insomnia, and occasional dizziness and lightheadedness.  Blood counts are unremarkable except for a platelet count of 41,000 today.  Chemistries are unremarkable except for worsening elevated liver transaminases with an SGOT of 117 and an SGPT of 90.  She states that she does have fatty liver.  Her  appetite is poor, and her weight is stable since her last visit.  She denies  fever, chills or other signs of infection.  She denies nausea, vomiting, bowel issues, or abdominal pain.  She denies sore throat, cough, dyspnea, or chest pain.  REVIEW OF SYSTEMS:  Review of Systems  Constitutional:  Positive for appetite change (poor appetite). Negative for chills, fatigue, fever and unexpected weight change.  HENT:  Negative.    Eyes: Negative.   Respiratory:  Positive for shortness of breath (on exertion). Negative for chest tightness, cough, hemoptysis and wheezing.   Cardiovascular: Negative.  Negative for chest pain, leg swelling and palpitations.  Gastrointestinal: Negative.  Negative for abdominal distention, abdominal pain, blood in stool, constipation, diarrhea, nausea and vomiting.  Endocrine: Negative.   Genitourinary: Negative.  Negative for difficulty urinating, dysuria, frequency and hematuria.   Musculoskeletal: Negative.  Negative for arthralgias, back pain, flank pain, gait problem and myalgias.  Skin: Negative.   Neurological:  Positive for dizziness (occasional) and light-headedness (occasional). Negative for extremity weakness, gait problem, headaches, numbness, seizures and speech difficulty.  Hematological:  Bruises/bleeds easily.  Psychiatric/Behavioral:  Positive for sleep disturbance (insomnia). Negative for depression. The patient is not nervous/anxious.     VITALS:  Blood pressure 124/79, pulse 90, temperature (!) 97.3 F (36.3 C), temperature source Oral, resp. rate 20, height '5\' 2"'$  (1.575 m), weight 174 lb 4.8 oz (79.1 kg), SpO2 94 %.  Wt Readings from Last 3 Encounters:  03/10/21 174 lb 4.8 oz (79.1 kg)  03/09/21 183 lb 10.3 oz (83.3 kg)  02/26/21 174 lb 6.4 oz (79.1 kg)    Body mass index is 31.88 kg/m.  Performance status (ECOG): 1 - Symptomatic but completely ambulatory  PHYSICAL EXAM:  Physical Exam Constitutional:      General: She is not in acute distress.    Appearance: Normal appearance. She is normal weight.  HENT:      Head: Normocephalic and atraumatic.  Eyes:     General: No scleral icterus.    Extraocular Movements: Extraocular movements intact.     Conjunctiva/sclera: Conjunctivae normal.     Pupils: Pupils are equal, round, and reactive to light.  Cardiovascular:     Rate and Rhythm: Normal rate and regular rhythm.     Pulses: Normal pulses.     Heart sounds: Normal heart sounds. No murmur heard.   No friction rub. No gallop.  Pulmonary:     Effort: Pulmonary effort is normal. No respiratory distress.     Breath sounds: Normal breath sounds.  Abdominal:     General: Bowel sounds are normal. There is no distension.     Palpations: Abdomen is soft. There is no hepatomegaly, splenomegaly or mass.     Tenderness: There is no abdominal tenderness.  Musculoskeletal:        General: Normal range of motion.     Cervical back: Normal range of motion and neck supple.     Right lower leg: No edema.     Left lower leg: No edema.  Lymphadenopathy:     Cervical: No cervical  adenopathy.  Skin:    General: Skin is warm and dry.     Findings: Bruising (extensive across the body) present.  Neurological:     General: No focal deficit present.     Mental Status: She is alert and oriented to person, place, and time. Mental status is at baseline.  Psychiatric:        Mood and Affect: Mood normal.        Behavior: Behavior normal.        Thought Content: Thought content normal.        Judgment: Judgment normal.    LABS:   CBC Latest Ref Rng & Units 03/10/2021 03/09/2021 03/08/2021  WBC - 10.8 12.4(H) 14.0(H)  Hemoglobin 12.0 - 16.0 12.7 11.5(L) 10.7(L)  Hematocrit 36 - 46 38 35.2(L) 32.6(L)  Platelets 150 - 399 41(A) 17(LL) 9(LL)   CMP Latest Ref Rng & Units 03/10/2021 03/06/2021 03/05/2021  Glucose 70 - 99 mg/dL - 203(H) 103(H)  BUN 4 - '21 19 17 15  '$ Creatinine 0.5 - 1.1 0.9 0.95 0.81  Sodium 137 - 147 140 135 135  Potassium 3.4 - 5.3 4.3 3.9 4.5  Chloride 99 - 108 103 100 104  CO2 13 - 22 26(A) 22 22   Calcium 8.7 - 10.7 9.6 9.5 9.7  Total Protein 6.5 - 8.1 g/dL - 6.8 7.4  Total Bilirubin 0.3 - 1.2 mg/dL - 0.6 0.6  Alkaline Phos 25 - 125 134(A) 63 76  AST 13 - 35 117(A) 65(H) 82(H)  ALT 7 - 35 90(A) 56(H) 63(H)    Lab Results  Component Value Date   TIBC 330 02/08/2021   FERRITIN 137 02/08/2021   IRONPCTSAT 11 02/08/2021   No results found for: LDH   STUDIES:  DG CHEST PORT 1 VIEW  Result Date: 02/15/2021 CLINICAL DATA:  Weakness. EXAM: PORTABLE CHEST 1 VIEW COMPARISON:  Chest radiograph 02/09/2021. FINDINGS: Patient is mildly rotated. Normal cardiac and mediastinal contours. No large area of pulmonary consolidation. No pleural effusion or pneumothorax. IMPRESSION: No acute cardiopulmonary process. Electronically Signed   By: Lovey Newcomer M.D.   On: 02/15/2021 15:38   DG CHEST PORT 1 VIEW  Result Date: 02/09/2021 CLINICAL DATA:  Wheezing.  History of COPD. EXAM: PORTABLE CHEST 1 VIEW COMPARISON:  02/08/2021 FINDINGS: Cardiac silhouette is normal in size. No mediastinal or hilar masses. There is hazy opacity at the right lung base partly silhouetting the lateral right hemidiaphragm. Remainder of the lungs is clear. Possible right pleural effusion. No convincing left pleural effusion. No pneumothorax. Skeletal structures are grossly intact. IMPRESSION: 1. No definite change from the previous day's study allowing for differences in patient positioning and technique. 2. Opacity at the right lung base is likely a combination of a small effusion and atelectasis. Pneumonia should be considered if there are consistent clinical findings. 3. No evidence of pulmonary edema. Electronically Signed   By: Lajean Manes M.D.   On: 02/09/2021 16:00   DG CHEST PORT 1 VIEW  Result Date: 02/08/2021 CLINICAL DATA:  Cough, weakness. EXAM: PORTABLE CHEST 1 VIEW COMPARISON:  Chest radiograph dated 09/20/2007. FINDINGS: The heart size is normal. Vascular calcifications are seen in the aortic arch. There are  mild bilateral lower lung predominant interstitial and airspace opacities. A small right pleural effusion may contribute. There is no left pleural effusion. There is no pneumothorax on either side. No acute osseous injury. IMPRESSION: Mild bilateral lower lung predominant interstitial and airspace opacities may represent pulmonary edema and/or pneumonia.  A small right pleural effusion may contribute. Electronically Signed   By: Zerita Boers M.D.   On: 02/08/2021 17:34      Allergies: No Known Allergies  Current Medications: Current Outpatient Medications  Medication Sig Dispense Refill   acetaminophen (TYLENOL) 500 MG tablet Take 1,000 mg by mouth every 6 (six) hours as needed for mild pain.     alendronate (FOSAMAX) 70 MG tablet Take 70 mg by mouth once a week. Friday     ALPRAZolam (XANAX) 1 MG tablet Take 0.5-1 mg by mouth daily as needed for anxiety.     budesonide-formoterol (SYMBICORT) 160-4.5 MCG/ACT inhaler Inhale 2 puffs into the lungs 2 (two) times daily as needed (shortness of breath).     busPIRone (BUSPAR) 15 MG tablet Take 15 mg by mouth 2 (two) times daily.     calcium-vitamin D (OSCAL WITH D) 500-200 MG-UNIT TABS tablet Take 1 tablet by mouth 2 (two) times daily with a meal.     furosemide (LASIX) 20 MG tablet Take 20 mg by mouth daily.     ipratropium-albuterol (DUONEB) 0.5-2.5 (3) MG/3ML SOLN Inhale 3 mLs into the lungs every 6 (six) hours as needed for shortness of breath.     levothyroxine (SYNTHROID) 25 MCG tablet Take 25 mcg by mouth every morning.     LINZESS 290 MCG CAPS capsule Take 290 mcg by mouth daily as needed (constipation).     midodrine (PROAMATINE) 10 MG tablet Take 1 tablet (10 mg total) by mouth 3 (three) times daily with meals. 90 tablet 0   Omega-3 1000 MG CAPS Take 2 capsules by mouth daily.     Oxycodone HCl 20 MG TABS Take 20 mg by mouth every 8 (eight) hours as needed (pain).     pantoprazole (PROTONIX) 40 MG tablet Take 1 tablet (40 mg total) by mouth  2 (two) times daily. 60 tablet 2   pramipexole (MIRAPEX) 0.5 MG tablet Take 0.5 mg by mouth at bedtime.     QUEtiapine (SEROQUEL) 200 MG tablet Take 200 mg by mouth at bedtime.     rOPINIRole (REQUIP) 0.5 MG tablet Take 0.5 mg by mouth 3 (three) times daily.     No current facility-administered medications for this visit.     ASSESSMENT & PLAN:   Assessment:   1. History of ITP with multiple relapses.  She has a relapse again while hospitalized at [facility] in July 2022, and again in August.  Her platelet count was up to 143,000 and we wanted to avoid the side effects of corticosteroids.  In mid August, her platelet count dropped again to less than 5,000 and she received 2 Nplate injections as well as 2 units of PRBC's and 1 unit of platelets.  Her platelet count was up to 17,000 by the time of discharge, and has improved to 41,000 today.    2. History of splenectomy, twice.  3. Chronic leukocytosis, this is resolved currently.  4. Tobacco abuse.  She is not motivated to quit.  5. History of lumpectomy in July 2018 which was benign.    6.  Osteopenia, stable to improved.  She will be due for repeat examination in December 2022, which will be scheduled through Dr. Jannette Fogo.    7.  Elevated liver transaminases.  She states that Dr. Jannette Fogo has evaluated her with imaging and labs and she was found to have fatty liver.  Plan: Her platelets have improved and are now 41,000, but remain low.  Therefore, we discussed  treatment with prednisone and she is in agreement.  If this is not effective, then we have other options that we can pursue.  I will send in for prednisone 60 mg daily, and taper as her platelets improve.  For now, we will continue to monitor her blood counts weekly.  We will see her back on September 2nd with CBC and CMP for repeat evaluation.  I will see her back on September 16th with CBC and CMP.  She is past due for annual mammography, and so I will schedule this for her.  She  understands and agrees with this plan of care.     I provided 20 minutes of face-to-face time during this this encounter and > 50% was spent counseling as documented under my assessment and plan.    [name], MD [facility] [address] [place] [address] Dept: [phone] Dept Fax: [phone]   I, Rita Ohara, am acting as scribe for [name], MD  I have reviewed this report as typed by the medical scribe, and it is complete and accurate.

## 2021-03-10 NOTE — Telephone Encounter (Signed)
Scheduled patient for Hospital Follow Up per Dr Edrick Kins.  Appt made w/Melissa 8/23 at 3:00 pm.    Will patient need Labs Drawn?

## 2021-03-10 NOTE — Telephone Encounter (Signed)
Per 8/22 LOS, patient scheduled for 8/26, 9/9 Labs ONLY, 9/2 Labs, Follow Up w/Kelli.  Gave patient Appt Summary

## 2021-03-11 ENCOUNTER — Telehealth: Payer: Self-pay | Admitting: Oncology

## 2021-03-11 ENCOUNTER — Ambulatory Visit: Payer: 59 | Admitting: Hematology and Oncology

## 2021-03-11 NOTE — Telephone Encounter (Signed)
Per 8/23 Staff Msg, please Canceled 9/21 Labs, Follow Up Appt's.  Requested patient be given Updated Appt Calendar on 8/26 Visit

## 2021-03-14 ENCOUNTER — Encounter: Payer: Self-pay | Admitting: Hematology and Oncology

## 2021-03-14 ENCOUNTER — Telehealth: Payer: Self-pay

## 2021-03-14 ENCOUNTER — Inpatient Hospital Stay: Payer: Medicare Other

## 2021-03-14 DIAGNOSIS — D693 Immune thrombocytopenic purpura: Secondary | ICD-10-CM

## 2021-03-14 LAB — CBC AND DIFFERENTIAL
HCT: 38 (ref 36–46)
Hemoglobin: 12 (ref 12.0–16.0)
Neutrophils Absolute: 28.12
Platelets: 251 (ref 150–399)
WBC: 32.7

## 2021-03-14 LAB — CBC: RBC: 3.78 — AB (ref 3.87–5.11)

## 2021-03-14 NOTE — Telephone Encounter (Signed)
Called patient and notified her regarding platelet counts and to stay on the same dose of prednisone per Dr. Hinton Rao

## 2021-03-21 ENCOUNTER — Encounter: Payer: Self-pay | Admitting: Hematology and Oncology

## 2021-03-21 ENCOUNTER — Other Ambulatory Visit: Payer: Self-pay | Admitting: Hematology and Oncology

## 2021-03-21 ENCOUNTER — Inpatient Hospital Stay: Payer: Medicare Other | Attending: Oncology

## 2021-03-21 ENCOUNTER — Inpatient Hospital Stay (INDEPENDENT_AMBULATORY_CARE_PROVIDER_SITE_OTHER): Payer: Medicare Other | Admitting: Hematology and Oncology

## 2021-03-21 DIAGNOSIS — M8580 Other specified disorders of bone density and structure, unspecified site: Secondary | ICD-10-CM | POA: Diagnosis not present

## 2021-03-21 DIAGNOSIS — R748 Abnormal levels of other serum enzymes: Secondary | ICD-10-CM | POA: Diagnosis not present

## 2021-03-21 DIAGNOSIS — D693 Immune thrombocytopenic purpura: Secondary | ICD-10-CM

## 2021-03-21 DIAGNOSIS — Z720 Tobacco use: Secondary | ICD-10-CM | POA: Diagnosis not present

## 2021-03-21 DIAGNOSIS — Z780 Asymptomatic menopausal state: Secondary | ICD-10-CM

## 2021-03-21 HISTORY — DX: Other specified disorders of bone density and structure, unspecified site: M85.80

## 2021-03-21 HISTORY — DX: Tobacco use: Z72.0

## 2021-03-21 HISTORY — DX: Asymptomatic menopausal state: Z78.0

## 2021-03-21 HISTORY — DX: Abnormal levels of other serum enzymes: R74.8

## 2021-03-21 LAB — CBC AND DIFFERENTIAL
HCT: 37 (ref 36–46)
Hemoglobin: 12.5 (ref 12.0–16.0)
Neutrophils Absolute: 22.02
Platelets: 535 — AB (ref 150–399)
WBC: 24.2

## 2021-03-21 LAB — BASIC METABOLIC PANEL
BUN: 31 — AB (ref 4–21)
CO2: 31 — AB (ref 13–22)
Chloride: 99 (ref 99–108)
Creatinine: 1.1 (ref 0.5–1.1)
Glucose: 144
Potassium: 4.5 (ref 3.4–5.3)
Sodium: 139 (ref 137–147)

## 2021-03-21 LAB — HEPATIC FUNCTION PANEL
ALT: 121 — AB (ref 7–35)
AST: 167 — AB (ref 13–35)
Alkaline Phosphatase: 114 (ref 25–125)
Bilirubin, Total: 0.3

## 2021-03-21 LAB — COMPREHENSIVE METABOLIC PANEL
Albumin: 4.3 (ref 3.5–5.0)
Calcium: 9.5 (ref 8.7–10.7)

## 2021-03-21 LAB — CBC: RBC: 3.74 — AB (ref 3.87–5.11)

## 2021-03-21 MED ORDER — PREDNISONE 10 MG PO TABS: 40.0000 mg | ORAL_TABLET | Freq: Every day | ORAL | 0 refills | Status: DC

## 2021-03-21 NOTE — Assessment & Plan Note (Deleted)
She has had an excellent response to high-dose prednisone.  As her platelet count is above normal, we will decrease her prednisone to 40 mg daily.  She states she has 10 mg tablets from the pharmacy, so she knows to take 4 tablets daily.  She requests a refill of her prednisone today, as she is not sure how many tablets she has at home.  We will plan to repeat a CBC next week and see her back in 2 weeks with a CBC and comprehensive metabolic panel.

## 2021-03-21 NOTE — Progress Notes (Signed)
[facility]  [address] [place],  [place]  [address] [phone]  Clinic Day:  03/21/2021  Referring physician: [name], MD  ASSESSMENT & PLAN:   Assessment & Plan: Acute ITP ([place]) Recurrent ITP. She has had an excellent response to high-dose prednisone.  As her platelet count is above normal, we will decrease her prednisone from 60 mg daily to 40 mg daily.  She states she has 10 mg tablets from the pharmacy, so she knows to take 4 tablets daily.  She requests a refill of her prednisone today, as she is not sure how many tablets she has at home.  We will plan to repeat a CBC next week and see her back in 2 weeks with a CBC and comprehensive metabolic panel.  Abnormal transaminases Chronic elevation of the liver transaminases felt to be due to fatty liver, which worsened after her last hospitalization. There continues to be worsening of the transaminases, which may be due to treatment.  We will continue to follow this.    The patient understands the plans discussed today and is in agreement with them.  She knows to contact our office if she develops concerns prior to her next appointment.    Ranon Coven A Channin Agustin, PA-C    No orders of the defined types were placed in this encounter.     CHIEF COMPLAINT:  CC:  Recurrent ITP  Current Treatment:   Prednisone 60 mg daily   HISTORY OF PRESENT ILLNESS:  The patient 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 She initially responded to that, but over the years has been on and off corticosteroids for relapsing ITP, and was treated with danazol for a time.  Her ITP had been in remission for many years, but she does has had chronic leukocytosis.  She started smoking at age 52 and so she has been  smoking approximately 1 pack per day for 49 years and continues to smoke despite numerous discussions about smoking cessation.  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  We recommended an ultrasound-guided biopsy which revealed fibroadenomatoid and fibrocystic changes, including cystic and micro papillary apocrine metaplasia, apocrine adenosis, stromal fibrosis, and foci of pseudo angiomatous stromal hyperplasia and focal sclerosis.  It was recommended that she have excision of this area and that was performed in July by Dr. Melynda Ripple.  The final pathology revealed atrophic breast tissue with fibrocystic changes and foci of usual ductal hyperplasia but no intraductal papilloma, fibroadenoma, atypia or malignancy.  There was another lumpectomy sample which revealed micropapillary ductal hyperplasia, focal ectatic ducts, and patchy peri ductal chronic inflammation.  The other area of lumpectomy had morphologic changes similar to the original biopsy.  We had been seeing her regularly, but then she was lost to follow-up after May 2019. She had a diagnostic right mammogram in April 2020 which revealed evolving fat necrosis.   She was admitted to [facility] in July 2022 due to developing an upper GI bleed from taking BC powders.  We had advised her to discontinue these previously.  She switched to Tylenol for her pain.  While in the hospital, her platelet count dropped into  the teens, and she was given steroids and IVIG.   She had extensive bruising. She was in the hospital for a total of 15 days and her platelet count had gone up to 63,000 at the time of discharge.  She was referred back to [place].  She was given a prescription for prednisone at the time of discharge, but Dr. Hinton Rao advised her not to take this, as her platelet count was normal.  When we saw her in early August her platelet count was up to 168,000.  She had  leukocytosis with white count was 14,000.  Her bruises had resolved and she was doing well.   She was then admitted to [facility] on August 17th due to severe thrombocytopenia with a platelet count less than 5,000.  She presented to the hospital after she removed a scab from her knee, and the wound continued to ooze blood.  She received Nplate injection x2, as well as 2 units of PRBCs and 1 unit of platelets.  Upon discharge, her platelet count had improved to 17,000.  She was advised to discontinue aspirin 81 mg.  She once again had extensive bruising, but also noted shortness of breath with exertion, insomnia, and occasional dizziness and lightheadedness. When she was seen on August 22nd, she had resolution of the leukocytosis and her platelet count was up to 41,000 today.  Chemistries revealed worsening elevation of the liver transaminases with an SGOT of 117 and an SGPT of 90.  She states that she has known fatty liver.  As her platelets remained low, she was started on prednisone 60 mg daily. We planned to monitor her CBC weekly and see her every 2 weeks.  She was overdue for screening mammogram, so we scheduled that as well.  INTERVAL HISTORY:  Martika is here today for repeat clinical assessment and states she has been doing fairly well. She continues prednisone 60 mg daily. She denies continued easy bruising or any abnormal bleeding. On August 26th, her platelet count was up to 251,000 and she had recurrent leukocytosis with a white blood cell count of 32,700 due to this steroid use, but we did not decrease her prednisone dose. She denies fevers or chills. She denies pain. Her appetite is good. Her weight has increased 2 pounds over last 2 weeks .  She would like to begin to taper prednisone.  REVIEW OF SYSTEMS:  Review of Systems  Constitutional:  Positive for fatigue. Negative for appetite change, chills, fever and unexpected weight change.  HENT:   Negative for lump/mass, mouth sores and sore  throat.   Respiratory:  Positive for cough (chronic, stable) and shortness of breath (chronic, stable).   Cardiovascular:  Negative for chest pain and leg swelling.  Gastrointestinal:  Positive for constipation (on medication). Negative for abdominal pain, diarrhea, nausea and vomiting.  Endocrine: Negative for hot flashes.  Genitourinary:  Negative for difficulty urinating, dysuria, frequency and hematuria.   Musculoskeletal:  Positive for back pain (chronic, stable, on medication). Negative for arthralgias and myalgias.  Skin:  Negative for rash.  Neurological:  Negative for dizziness and headaches.  Hematological:  Negative for adenopathy. Does not bruise/bleed easily.  Psychiatric/Behavioral:  Positive for sleep disturbance (on medication). Negative for depression. The patient is not nervous/anxious.     VITALS:  Blood pressure 102/64, pulse 88, temperature 98.4 F (36.9 C), temperature source Oral, resp. rate 20, height '5\' 2"'$  (1.575 m), weight 176 lb 4.8 oz (80 kg), SpO2 95 %.  Wt Readings from  Last 3 Encounters:  03/21/21 176 lb 4.8 oz (80 kg)  03/10/21 174 lb 4.8 oz (79.1 kg)  03/09/21 183 lb 10.3 oz (83.3 kg)    Body mass index is 32.25 kg/m.  Performance status (ECOG): 1 - Symptomatic but completely ambulatory  PHYSICAL EXAM:  Physical Exam Vitals and nursing note reviewed.  Constitutional:      General: She is not in acute distress.    Appearance: Normal appearance.  HENT:     Head: Normocephalic and atraumatic.     Mouth/Throat:     Mouth: Mucous membranes are moist.     Pharynx: Oropharynx is clear. No oropharyngeal exudate or posterior oropharyngeal erythema.  Eyes:     General: No scleral icterus.    Extraocular Movements: Extraocular movements intact.     Conjunctiva/sclera: Conjunctivae normal.     Pupils: Pupils are equal, round, and reactive to light.  Cardiovascular:     Rate and Rhythm: Normal rate and regular rhythm.     Heart sounds: Normal heart  sounds. No murmur heard.   No friction rub. No gallop.  Pulmonary:     Effort: Pulmonary effort is normal.     Breath sounds: Normal breath sounds. No wheezing, rhonchi or rales.  Abdominal:     General: There is no distension.     Palpations: Abdomen is soft. There is no hepatomegaly or mass.     Tenderness: no abdominal tenderness     Comments:   The spleen is surgically absent  Musculoskeletal:        General: Normal range of motion.     Cervical back: Normal range of motion and neck supple. No tenderness.     Right lower leg: No edema.     Left lower leg: No edema.  Lymphadenopathy:     Cervical: No cervical adenopathy.     Upper Body:     Right upper body: No supraclavicular or axillary adenopathy.     Left upper body: No supraclavicular or axillary adenopathy.     Lower Body: No right inguinal adenopathy. No left inguinal adenopathy.  Skin:    General: Skin is warm and dry.     Coloration: Skin is not jaundiced.     Findings: No rash.  Neurological:     Mental Status: She is alert and oriented to person, place, and time.     Cranial Nerves: No cranial nerve deficit.  Psychiatric:        Mood and Affect: Mood normal.        Behavior: Behavior normal.        Thought Content: Thought content normal.    LABS:   CBC Latest Ref Rng & Units 03/21/2021 03/14/2021 03/10/2021  WBC - 24.2 32.7 10.8  Hemoglobin 12.0 - 16.0 12.5 12.0 12.7  Hematocrit 36 - 46 37 38 38  Platelets 150 - 399 535(A) 251 41(A)   CMP Latest Ref Rng & Units 03/21/2021 03/10/2021 03/06/2021  Glucose 70 - 99 mg/dL - - 203(H)  BUN 4 - 21 31(A) 19 17  Creatinine 0.5 - 1.1 1.1 0.9 0.95  Sodium 137 - 147 139 140 135  Potassium 3.4 - 5.3 4.5 4.3 3.9  Chloride 99 - 108 99 103 100  CO2 13 - 22 31(A) 26(A) 22  Calcium 8.7 - 10.7 9.5 9.6 9.5  Total Protein 6.5 - 8.1 g/dL - - 6.8  Total Bilirubin 0.3 - 1.2 mg/dL - - 0.6  Alkaline Phos 25 - 125 114 134(A) 63  AST 13 - 35 167(A) 117(A) 65(H)  ALT 7 - 35 121(A)  90(A) 56(H)     No results found for: CEA1 / No results found for: CEA1 No results found for: PSA1 No results found for: [id number] No results found for: CAN125  No results found for: TOTALPROTELP, ALBUMINELP, A1GS, A2GS, BETS, BETA2SER, GAMS, MSPIKE, SPEI Lab Results  Component Value Date   TIBC 330 02/08/2021   FERRITIN 137 02/08/2021   IRONPCTSAT 11 02/08/2021   No results found for: LDH  STUDIES:  No results found.    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 No pertinent family history.  Social History:  reports that she has been smoking cigarettes. She has never used smokeless tobacco. No history on file for alcohol use and drug use.The patient is alone today.  Allergies: No Known Allergies  Current Medications: Current Outpatient Medications  Medication Sig Dispense Refill   predniSONE (DELTASONE) 10 MG tablet Take 4 tablets (40 mg total) by mouth daily with breakfast. 120 tablet 0   acetaminophen (TYLENOL) 500 MG tablet Take 1,000 mg by mouth every 6 (six) hours as needed for mild pain.     alendronate (FOSAMAX) 70 MG tablet Take 70 mg by mouth once a week. Friday     ALPRAZolam (XANAX) 1 MG tablet Take 0.5-1 mg by mouth daily as needed for anxiety.     budesonide-formoterol (SYMBICORT) 160-4.5 MCG/ACT inhaler Inhale 2 puffs into the lungs 2 (two) times daily as needed (shortness of breath).     busPIRone (BUSPAR) 15 MG tablet Take 15 mg by mouth 2 (two) times daily.     calcium-vitamin D (OSCAL WITH D) 500-200 MG-UNIT TABS tablet Take 1 tablet by mouth 2 (two) times  daily with a meal.     furosemide (LASIX) 20 MG tablet Take 20 mg by mouth daily.     ipratropium-albuterol (DUONEB) 0.5-2.5 (3) MG/3ML SOLN Inhale 3 mLs into the lungs every 6 (six) hours as needed for shortness of breath.     levothyroxine (SYNTHROID) 25 MCG tablet Take 25 mcg by mouth every morning.     LINZESS 290 MCG CAPS capsule Take 290 mcg by mouth daily as needed (constipation).     midodrine (PROAMATINE) 10 MG tablet Take 1 tablet (10 mg total) by mouth 3 (three) times daily with meals. 90 tablet 0   Omega-3 1000 MG CAPS Take 2 capsules by mouth daily.     Oxycodone HCl 20 MG TABS Take 20 mg by mouth every 8 (eight) hours as needed (pain).     pantoprazole (PROTONIX) 40 MG tablet Take 1 tablet (40 mg total) by mouth 2 (two) times daily. 60 tablet 2   pramipexole (MIRAPEX) 0.5 MG tablet Take 0.5 mg by mouth at bedtime.     QUEtiapine (SEROQUEL) 200 MG tablet Take 200 mg by mouth at bedtime.     rOPINIRole (REQUIP) 0.5 MG tablet Take 0.5 mg by mouth 3 (three) times daily.     No current facility-administered medications for this visit.

## 2021-03-21 NOTE — Assessment & Plan Note (Deleted)
I spent some time counseling the patient the importance of tobacco cessation. We discussed common tobacco cessation strategies She is currently not interested in quitting.

## 2021-03-21 NOTE — Assessment & Plan Note (Addendum)
Chronic elevation of the liver transaminases felt to be due to fatty liver, which worsened after her last hospitalization. There continues to be worsening of the transaminases, which may be due to treatment.  We will continue to follow this.

## 2021-03-21 NOTE — Assessment & Plan Note (Addendum)
Recurrent ITP. She has had an excellent response to high-dose prednisone.  As her platelet count is above normal, we will decrease her prednisone from 60 mg daily to 40 mg daily.  She states she has 10 mg tablets from the pharmacy, so she knows to take 4 tablets daily.  She requests a refill of her prednisone today, as she is not sure how many tablets she has at home.  We will plan to repeat a CBC next week and see her back in 2 weeks with a CBC and comprehensive metabolic panel.

## 2021-03-28 ENCOUNTER — Other Ambulatory Visit: Payer: Self-pay

## 2021-03-28 ENCOUNTER — Inpatient Hospital Stay: Payer: Medicare Other

## 2021-03-28 ENCOUNTER — Other Ambulatory Visit: Payer: Self-pay | Admitting: Hematology and Oncology

## 2021-03-28 DIAGNOSIS — D693 Immune thrombocytopenic purpura: Secondary | ICD-10-CM

## 2021-03-28 LAB — BASIC METABOLIC PANEL
BUN: 26 — AB (ref 4–21)
CO2: 24 — AB (ref 13–22)
Chloride: 100 (ref 99–108)
Creatinine: 0.8 (ref 0.5–1.1)
Glucose: 131
Potassium: 5.1 (ref 3.4–5.3)
Sodium: 136 — AB (ref 137–147)

## 2021-03-28 LAB — HEPATIC FUNCTION PANEL
ALT: 113 — AB (ref 7–35)
AST: 84 — AB (ref 13–35)
Alkaline Phosphatase: 120 (ref 25–125)
Bilirubin, Total: 0.4

## 2021-03-28 LAB — CBC AND DIFFERENTIAL
HCT: 43 (ref 36–46)
Hemoglobin: 14.3 (ref 12.0–16.0)
Neutrophils Absolute: 21.57
Platelets: 245 (ref 150–399)
WBC: 23.7

## 2021-03-28 LAB — COMPREHENSIVE METABOLIC PANEL
Albumin: 4.3 (ref 3.5–5.0)
Calcium: 9.9 (ref 8.7–10.7)

## 2021-03-28 LAB — CBC: RBC: 4.39 (ref 3.87–5.11)

## 2021-03-31 ENCOUNTER — Other Ambulatory Visit: Payer: Self-pay

## 2021-04-04 ENCOUNTER — Other Ambulatory Visit: Payer: Self-pay

## 2021-04-04 ENCOUNTER — Encounter: Payer: Self-pay | Admitting: Hematology and Oncology

## 2021-04-04 ENCOUNTER — Inpatient Hospital Stay: Payer: Medicare Other

## 2021-04-04 ENCOUNTER — Other Ambulatory Visit: Payer: Self-pay | Admitting: Hematology and Oncology

## 2021-04-04 ENCOUNTER — Inpatient Hospital Stay (INDEPENDENT_AMBULATORY_CARE_PROVIDER_SITE_OTHER): Payer: Medicare Other | Admitting: Hematology and Oncology

## 2021-04-04 DIAGNOSIS — D693 Immune thrombocytopenic purpura: Secondary | ICD-10-CM | POA: Diagnosis not present

## 2021-04-04 LAB — CBC AND DIFFERENTIAL
HCT: 43 (ref 36–46)
Hemoglobin: 13.9 (ref 12.0–16.0)
Neutrophils Absolute: 17.25
Platelets: 13 — AB (ref 150–399)
WBC: 19.6

## 2021-04-04 LAB — CBC
MCV: 100 — AB (ref 81–99)
RBC: 4.33 (ref 3.87–5.11)

## 2021-04-04 NOTE — Assessment & Plan Note (Signed)
She was doing well with high dose prednisone as her platelets had normalized. She was weaned from 72 to 35 mg and today presents with platelet count of 13. She has multiple bruises, but denies bleeding. We will increase prednisone to 50 mg. She will repeat labs in one week and return to clinic in 2 weeks for repeat labs and follow up.

## 2021-04-04 NOTE — Progress Notes (Signed)
[facility]  [address] [place],  [place]  [address] [phone]  Clinic Day:  04/04/2021  Referring physician: [name], MD  ASSESSMENT & PLAN:   Assessment & Plan: Acute ITP [facility]) She was doing well with high dose prednisone as her platelets had normalized. She was weaned from 28 to 35 mg and today presents with platelet count of 13. She has multiple bruises, but denies bleeding. We will increase prednisone to 50 mg. She will repeat labs in one week and return to clinic in 2 weeks for repeat labs and follow up.   The patient understands the plans discussed today and is in agreement with them.  She knows to contact our office if she develops concerns prior to her next appointment.    [name], NP  [facility] [address] [place] [address] Dept: [phone] Dept Fax: [phone]   No orders of the defined types were placed in this encounter.     CHIEF COMPLAINT:  CC: A 70 year old female with history of ITP here for 2 week evaluation.  Current Treatment:  Prednisone   HISTORY OF PRESENT ILLNESS:   Oncology History   No history exists.     INTERVAL HISTORY:  Jean Kramer is here today for repeat clinical assessment. She continues to have shortness of breath and fatigue. She denies fever, chills, nausea or vomiting. She denies issue with bowel or bladder. She denies cough or chest pain. She has multiple bruises, but denies bleeding. CBC today reveals platelets 13.  REVIEW OF SYSTEMS:  Review of Systems  Constitutional:  Positive for fatigue. Negative for appetite change, chills, diaphoresis, fever and unexpected weight change.  HENT:   Negative for hearing loss, lump/mass, mouth sores, nosebleeds, sore throat, tinnitus, trouble swallowing and voice change.   Eyes:  Negative for eye problems and icterus.  Respiratory:  Positive for shortness of breath.  Negative for chest tightness, cough, hemoptysis and wheezing.   Cardiovascular:  Negative for chest pain, leg swelling and palpitations.  Gastrointestinal:  Negative for abdominal distention, abdominal pain, blood in stool, constipation, diarrhea, nausea, rectal pain and vomiting.  Endocrine: Negative for hot flashes.  Genitourinary:  Negative for bladder incontinence, difficulty urinating, dyspareunia, dysuria, frequency, hematuria, menstrual problem, nocturia, pelvic pain, vaginal bleeding and vaginal discharge.   Musculoskeletal:  Negative for arthralgias, back pain, flank pain, gait problem, myalgias, neck pain and neck stiffness.  Skin:  Negative for itching, rash and wound.  Neurological:  Negative for dizziness, extremity weakness, gait problem, headaches, light-headedness, numbness, seizures and speech difficulty.  Hematological:  Negative for adenopathy. Bruises/bleeds easily.  Psychiatric/Behavioral:  Negative for confusion, decreased concentration, depression, sleep disturbance and suicidal ideas. The patient is not nervous/anxious.     VITALS:  Blood pressure 120/69, pulse 79, temperature 97.7 F (36.5 C), temperature source Oral, resp. rate (!) 22, height '5\' 2"'$  (1.575 m), weight 177 lb 14.2 oz (80.7 kg), SpO2 98 %.  Wt Readings from Last 3 Encounters:  04/04/21 177 lb 14.2 oz (80.7 kg)  03/28/21 176 lb 6.4 oz (80 kg)  03/21/21 176 lb 4.8 oz (80 kg)    Body mass index is 32.54 kg/m.  Performance status (ECOG): 2 - Symptomatic, <50% confined to bed  PHYSICAL EXAM:  Physical Exam Constitutional:      General: She is not in acute distress.    Appearance: Normal appearance. She is normal weight. She is not  ill-appearing, toxic-appearing or diaphoretic.  HENT:     Head: Normocephalic and atraumatic.     Nose: Nose normal. No congestion or rhinorrhea.     Mouth/Throat:     Mouth: Mucous membranes are moist.     Pharynx: Oropharynx is clear. No oropharyngeal exudate or  posterior oropharyngeal erythema.  Eyes:     General: No scleral icterus.       Right eye: No discharge.        Left eye: No discharge.     Extraocular Movements: Extraocular movements intact.     Conjunctiva/sclera: Conjunctivae normal.     Pupils: Pupils are equal, round, and reactive to light.  Neck:     Vascular: No carotid bruit.  Cardiovascular:     Rate and Rhythm: Normal rate and regular rhythm.     Heart sounds: No murmur heard.   No friction rub. No gallop.  Pulmonary:     Effort: Pulmonary effort is normal. No respiratory distress.     Breath sounds: No stridor. Rhonchi present. No wheezing or rales.  Chest:     Chest wall: No tenderness.  Abdominal:     General: Abdomen is flat. Bowel sounds are normal. There is no distension.     Palpations: There is no mass.     Tenderness: There is no abdominal tenderness. There is no right CVA tenderness, left CVA tenderness, guarding or rebound.     Hernia: No hernia is present.  Musculoskeletal:        General: No swelling, tenderness, deformity or signs of injury. Normal range of motion.     Cervical back: Normal range of motion and neck supple. No rigidity or tenderness.     Right lower leg: No edema.     Left lower leg: No edema.  Lymphadenopathy:     Cervical: No cervical adenopathy.  Skin:    General: Skin is warm and dry.     Capillary Refill: Capillary refill takes less than 2 seconds.     Coloration: Skin is pale. Skin is not jaundiced.     Findings: Bruising present. No erythema, lesion or rash.  Neurological:     General: No focal deficit present.     Mental Status: She is alert and oriented to person, place, and time. Mental status is at baseline.     Cranial Nerves: No cranial nerve deficit.     Sensory: No sensory deficit.     Motor: No weakness.     Coordination: Coordination normal.     Gait: Gait normal.     Deep Tendon Reflexes: Reflexes normal.  Psychiatric:        Mood and Affect: Mood normal.         Behavior: Behavior normal.        Thought Content: Thought content normal.        Judgment: Judgment normal.   LABS:   CBC Latest Ref Rng & Units 04/04/2021 03/28/2021 03/21/2021  WBC - 19.6 23.7 24.2  Hemoglobin 12.0 - 16.0 13.9 14.3 12.5  Hematocrit 36 - 46 43 43 37  Platelets 150 - 399 13(A) 245 535(A)   CMP Latest Ref Rng & Units 03/28/2021 03/21/2021 03/10/2021  Glucose 70 - 99 mg/dL - - -  BUN 4 - 21 26(A) 31(A) 19  Creatinine 0.5 - 1.1 0.8 1.1 0.9  Sodium 137 - 147 136(A) 139 140  Potassium 3.4 - 5.3 5.1 4.5 4.3  Chloride 99 - 108 100 99 103  CO2 13 -  22 24(A) 31(A) 26(A)  Calcium 8.7 - 10.7 9.9 9.5 9.6  Total Protein 6.5 - 8.1 g/dL - - -  Total Bilirubin 0.3 - 1.2 mg/dL - - -  Alkaline Phos 25 - 125 120 114 134(A)  AST 13 - 35 84(A) 167(A) 117(A)  ALT 7 - 35 113(A) 121(A) 90(A)     No results found for: CEA1 / No results found for: CEA1 No results found for: PSA1 No results found for: [id number] No results found for: [id number]  No results found for: TOTALPROTELP, ALBUMINELP, A1GS, A2GS, BETS, BETA2SER, GAMS, MSPIKE, SPEI Lab Results  Component Value Date   TIBC 330 02/08/2021   FERRITIN 137 02/08/2021   IRONPCTSAT 11 02/08/2021   No results found for: LDH  STUDIES:  No results found.    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 No pertinent family history.  Social History:  reports that she has been smoking cigarettes. She has never used smokeless tobacco. No history on file for alcohol use and drug use.The patient is alone  today.  Allergies: No Known  Allergies  Current Medications: Current Outpatient Medications  Medication Sig Dispense Refill   acetaminophen (TYLENOL) 500 MG tablet Take 1,000 mg by mouth every 6 (six) hours as needed for mild pain.     alendronate (FOSAMAX) 70 MG tablet Take 70 mg by mouth once a week. Friday     ALPRAZolam (XANAX) 1 MG tablet Take 0.5-1 mg by mouth daily as needed for anxiety.     budesonide-formoterol (SYMBICORT) 160-4.5 MCG/ACT inhaler Inhale 2 puffs into the lungs 2 (two) times daily as needed (shortness of breath).     busPIRone (BUSPAR) 15 MG tablet Take 15 mg by mouth 2 (two) times daily.     calcium-vitamin D (OSCAL WITH D) 500-200 MG-UNIT TABS tablet Take 1 tablet by mouth 2 (two) times daily with a meal.     furosemide (LASIX) 20 MG tablet Take 20 mg by mouth daily.     ipratropium-albuterol (DUONEB) 0.5-2.5 (3) MG/3ML SOLN Inhale 3 mLs into the lungs every 6 (six) hours as needed for shortness of breath.     levothyroxine (SYNTHROID) 25 MCG tablet Take 25 mcg by mouth every morning.     LINZESS 290 MCG CAPS capsule Take 290 mcg by mouth daily as needed (constipation).     Omega-3 1000 MG CAPS Take 2 capsules by mouth daily.     Oxycodone HCl 20 MG TABS Take 20 mg by mouth every 8 (eight) hours as needed (pain).     pantoprazole (PROTONIX) 40 MG tablet Take 1 tablet (40 mg total) by mouth 2 (two) times daily. 60 tablet 2   pramipexole (MIRAPEX) 0.5 MG tablet Take 0.5 mg by mouth at bedtime.     predniSONE (DELTASONE) 5 MG tablet Take 35 mg by mouth daily with breakfast. 03/28/21 per Dr. Hinton Rao patient to take '35mg'$  of Prednisone daily     QUEtiapine (SEROQUEL) 200 MG tablet Take 200 mg by mouth at bedtime.     rOPINIRole (REQUIP)  0.5 MG tablet Take 0.5 mg by mouth 3 (three) times daily.     No current facility-administered medications for this visit.

## 2021-04-09 ENCOUNTER — Other Ambulatory Visit: Payer: 59

## 2021-04-09 ENCOUNTER — Ambulatory Visit: Payer: 59 | Admitting: Oncology

## 2021-04-11 ENCOUNTER — Other Ambulatory Visit: Payer: Self-pay | Admitting: Hematology and Oncology

## 2021-04-11 ENCOUNTER — Inpatient Hospital Stay: Payer: Medicare Other

## 2021-04-11 ENCOUNTER — Telehealth: Payer: Self-pay

## 2021-04-11 DIAGNOSIS — D693 Immune thrombocytopenic purpura: Secondary | ICD-10-CM

## 2021-04-11 LAB — CBC
MCV: 101 — AB (ref 81–99)
RBC: 4.37 (ref 3.87–5.11)

## 2021-04-11 LAB — CBC AND DIFFERENTIAL
HCT: 44 (ref 36–46)
Hemoglobin: 13.9 (ref 12.0–16.0)
Neutrophils Absolute: 16.28
Platelets: 24 — AB (ref 150–399)
WBC: 17.7

## 2021-04-11 NOTE — Telephone Encounter (Signed)
Jamie in Hematology called with critical platelet count. Platelet count :24. Rosanne Sack, PA-C aware

## 2021-04-11 NOTE — Progress Notes (Unsigned)
Patient here for labs only.  She reports fatigue, as well as easy bruising, but denies abnormal bleeding. Platelets are up to 24,000 with the increase in prednisone back to 50 mg daily.  Since the platelets have improved, we will continue that same dose.

## 2021-04-17 ENCOUNTER — Other Ambulatory Visit: Payer: Self-pay | Admitting: Hematology and Oncology

## 2021-04-17 ENCOUNTER — Telehealth: Payer: Self-pay | Admitting: Hematology and Oncology

## 2021-04-17 DIAGNOSIS — D693 Immune thrombocytopenic purpura: Secondary | ICD-10-CM

## 2021-04-17 MED ORDER — PREDNISONE 10 MG PO TABS: 50.0000 mg | ORAL_TABLET | Freq: Every day | ORAL | 1 refills | Status: DC

## 2021-04-17 NOTE — Telephone Encounter (Signed)
Per 9/29 LOS, patient's 9/30 Afternoon Appt's rescheduled to the morning.  Ok per patient

## 2021-04-18 ENCOUNTER — Inpatient Hospital Stay: Payer: Medicare Other

## 2021-04-18 ENCOUNTER — Other Ambulatory Visit: Payer: 59

## 2021-04-18 ENCOUNTER — Ambulatory Visit: Payer: 59 | Admitting: Hematology and Oncology

## 2021-04-18 ENCOUNTER — Inpatient Hospital Stay (INDEPENDENT_AMBULATORY_CARE_PROVIDER_SITE_OTHER): Payer: Medicare Other | Admitting: Hematology and Oncology

## 2021-04-18 DIAGNOSIS — D693 Immune thrombocytopenic purpura: Secondary | ICD-10-CM | POA: Diagnosis not present

## 2021-04-18 LAB — CBC AND DIFFERENTIAL
HCT: 43 (ref 36–46)
Hemoglobin: 13.7 (ref 12.0–16.0)
Neutrophils Absolute: 15.66
Platelets: 33 — AB (ref 150–399)
WBC: 17.4

## 2021-04-18 LAB — COMPREHENSIVE METABOLIC PANEL
Albumin: 4.2 (ref 3.5–5.0)
Calcium: 9.4 (ref 8.7–10.7)

## 2021-04-18 LAB — HEPATIC FUNCTION PANEL
ALT: 95 — AB (ref 7–35)
AST: 81 — AB (ref 13–35)
Alkaline Phosphatase: 99 (ref 25–125)
Bilirubin, Total: 0.5

## 2021-04-18 LAB — BASIC METABOLIC PANEL
BUN: 26 — AB (ref 4–21)
CO2: 32 — AB (ref 13–22)
Chloride: 96 — AB (ref 99–108)
Creatinine: 0.8 (ref 0.5–1.1)
Glucose: 103
Potassium: 4.7 (ref 3.4–5.3)
Sodium: 136 — AB (ref 137–147)

## 2021-04-18 LAB — CBC: RBC: 4.19 (ref 3.87–5.11)

## 2021-04-18 NOTE — Assessment & Plan Note (Signed)
She remains on prednisone 50 mg daily and platelet count continues to slowly increase. She started at 13 two weeks ago and now is up to 33. She will continue with the dose and repeat labs with evaluation in 2 weeks.

## 2021-04-18 NOTE — Progress Notes (Signed)
[facility]  [address] [place],  [place]  [address] [phone]  Clinic Day:  04/18/2021  Referring physician: [name], MD  ASSESSMENT & PLAN:   Assessment & Plan: Chronic ITP (idiopathic thrombocytopenia) (HCC) She remains on prednisone 50 mg daily and platelet count continues to slowly increase. She started at 13 two weeks ago and now is up to 33. She will continue with the dose and repeat labs with evaluation in 2 weeks.   The patient understands the plans discussed today and is in agreement with them.  She knows to contact our office if she develops concerns prior to her next appointment.    [name], NP  [facility] [address] [place] [address] Dept: [phone] Dept Fax: [phone]   Orders Placed This Encounter  Procedures   CBC and differential    This external order was created through the Results Console.   CBC    This external order was created through the Results Console.   Basic metabolic panel    This external order was created through the Results Console.   Comprehensive metabolic panel    This external order was created through the Results Console.   Hepatic function panel    This external order was created through the Results Console.      CHIEF COMPLAINT:  CC: A 70 year old female with history of chronic ITP here for 2 week evaluation.  Current Treatment:  Prednisone 50 mg   HISTORY OF PRESENT ILLNESS:   Oncology History   No history exists.     INTERVAL HISTORY:  Jean Kramer is here today for repeat clinical assessment. She has been well since last visit. She continues on prednisone daily and denies any new bleeding or bruising. She remains short of breath and states she is due for a breathing treatment when she gets home. She denies fever, chills, nausea or vomiting. She denies cough or chest pain. She denies issue with  bowel or bladder. CBC today reveals an increase in platelets from 24 last week to 33 today. REVIEW OF SYSTEMS:  Review of Systems  Constitutional:  Positive for fatigue. Negative for appetite change, chills, diaphoresis, fever and unexpected weight change.  HENT:   Negative for hearing loss, lump/mass, mouth sores, nosebleeds, sore throat, tinnitus, trouble swallowing and voice change.   Eyes:  Negative for eye problems and icterus.  Respiratory:  Positive for shortness of breath. Negative for chest tightness, cough, hemoptysis and wheezing.   Cardiovascular:  Negative for chest pain, leg swelling and palpitations.  Gastrointestinal:  Negative for abdominal distention, abdominal pain, blood in stool, constipation, diarrhea, nausea, rectal pain and vomiting.  Endocrine: Negative for hot flashes.  Genitourinary:  Negative for bladder incontinence, difficulty urinating, dyspareunia, dysuria, frequency, hematuria and nocturia.   Musculoskeletal:  Positive for gait problem. Negative for arthralgias, back pain, flank pain, myalgias, neck pain and neck stiffness.  Skin:  Negative for itching, rash and wound.  Neurological:  Positive for extremity weakness and gait problem. Negative for dizziness, headaches, light-headedness, numbness, seizures and speech difficulty.  Hematological:  Negative for adenopathy. Does not bruise/bleed easily.  Psychiatric/Behavioral:  Negative for confusion, decreased concentration, depression, sleep disturbance and suicidal ideas. The patient is not nervous/anxious.     VITALS:  Blood pressure 121/67, pulse 88, resp. rate (!) 22, height 5\' 2"  (1.575 m), weight 178 lb 8 oz (81 kg), SpO2 96 %.  Wt Readings from Last 3 Encounters:  04/18/21 178 lb 8 oz (81 kg)  04/04/21 177 lb 14.2 oz (80.7 kg)  03/28/21 176 lb 6.4 oz (80 kg)    Body mass index is 32.65 kg/m.  Performance status (ECOG): 1 - Symptomatic but completely ambulatory  PHYSICAL EXAM:  Physical  Exam Constitutional:      General: She is not in acute distress.    Appearance: Normal appearance. She is normal weight. She is not ill-appearing, toxic-appearing or diaphoretic.  HENT:     Head: Normocephalic and atraumatic.     Nose: Nose normal. No congestion or rhinorrhea.     Mouth/Throat:     Mouth: Mucous membranes are moist.     Pharynx: Oropharynx is clear. No oropharyngeal exudate or posterior oropharyngeal erythema.  Eyes:     General: No scleral icterus.       Right eye: No discharge.        Left eye: No discharge.     Extraocular Movements: Extraocular movements intact.     Conjunctiva/sclera: Conjunctivae normal.     Pupils: Pupils are equal, round, and reactive to light.  Neck:     Vascular: No carotid bruit.  Cardiovascular:     Rate and Rhythm: Normal rate and regular rhythm.     Heart sounds: No murmur heard.   No friction rub. No gallop.  Pulmonary:     Effort: Pulmonary effort is normal. No respiratory distress.     Breath sounds: No stridor. Rhonchi present. No wheezing or rales.  Chest:     Chest wall: No tenderness.  Abdominal:     General: Abdomen is flat. Bowel sounds are normal. There is no distension.     Palpations: There is no mass.     Tenderness: There is no abdominal tenderness. There is no right CVA tenderness, left CVA tenderness, guarding or rebound.     Hernia: No hernia is present.  Musculoskeletal:        General: No swelling, tenderness, deformity or signs of injury. Normal range of motion.     Cervical back: Normal range of motion and neck supple. No rigidity or tenderness.     Right lower leg: No edema.     Left lower leg: No edema.  Lymphadenopathy:     Cervical: No cervical adenopathy.  Skin:    General: Skin is warm and dry.     Capillary Refill: Capillary refill takes less than 2 seconds.     Coloration: Skin is not jaundiced or pale.     Findings: No bruising, erythema, lesion or rash.  Neurological:     General: No focal  deficit present.     Mental Status: She is alert and oriented to person, place, and time. Mental status is at baseline.     Cranial Nerves: No cranial nerve deficit.     Sensory: No sensory deficit.     Motor: No weakness.     Coordination: Coordination normal.     Gait: Gait normal.     Deep Tendon Reflexes: Reflexes normal.  Psychiatric:        Mood and Affect: Mood normal.        Behavior: Behavior normal.        Thought Content: Thought content normal.        Judgment: Judgment normal.    LABS:   CBC Latest Ref Rng & Units 04/18/2021 04/11/2021 04/04/2021  WBC - 17.4 17.7 19.6  Hemoglobin 12.0 - 16.0 13.7 13.9  13.9  Hematocrit 36 - 46 43 44 43  Platelets 150 - 399 33(A) 24(A) 13(A)   CMP Latest Ref Rng & Units 04/18/2021 03/28/2021 03/21/2021  Glucose 70 - 99 mg/dL - - -  BUN 4 - 21 26(A) 26(A) 31(A)  Creatinine 0.5 - 1.1 0.8 0.8 1.1  Sodium 137 - 147 136(A) 136(A) 139  Potassium 3.4 - 5.3 4.7 5.1 4.5  Chloride 99 - 108 96(A) 100 99  CO2 13 - 22 32(A) 24(A) 31(A)  Calcium 8.7 - 10.7 9.4 9.9 9.5  Total Protein 6.5 - 8.1 g/dL - - -  Total Bilirubin 0.3 - 1.2 mg/dL - - -  Alkaline Phos 25 - 125 99 120 114  AST 13 - 35 81(A) 84(A) 167(A)  ALT 7 - 35 95(A) 113(A) 121(A)     No results found for: CEA1 / No results found for: CEA1 No results found for: PSA1 No results found for: MBW466 No results found for: ZLD357  No results found for: TOTALPROTELP, ALBUMINELP, A1GS, A2GS, BETS, BETA2SER, GAMS, MSPIKE, SPEI Lab Results  Component Value Date   TIBC 330 02/08/2021   FERRITIN 137 02/08/2021   IRONPCTSAT 11 02/08/2021   No results found for: LDH  STUDIES:  No results found.    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  Social History:  reports that she has been smoking cigarettes. She has never used smokeless tobacco. No history on file for alcohol use and drug use.The patient is alone  today.  Allergies: No Known Allergies  Current Medications: Current Outpatient Medications  Medication Sig Dispense Refill   acetaminophen (TYLENOL) 500 MG tablet Take 1,000 mg by mouth every 6 (six) hours as needed for mild pain.     alendronate (FOSAMAX) 70 MG tablet Take 70 mg by mouth once a week. Friday     ALPRAZolam (XANAX) 1 MG tablet Take 0.5-1 mg by mouth daily as needed for anxiety.     budesonide-formoterol (SYMBICORT) 160-4.5 MCG/ACT inhaler Inhale 2 puffs into the lungs 2 (two) times daily as needed (shortness of breath).     busPIRone (BUSPAR) 15 MG tablet Take 15 mg by mouth 2 (two) times daily.     calcium-vitamin D (OSCAL WITH D) 500-200 MG-UNIT TABS tablet Take 1 tablet by mouth 2 (two) times daily with a meal.     furosemide (LASIX) 20 MG tablet Take 20 mg by mouth daily.     ipratropium-albuterol (DUONEB) 0.5-2.5 (3) MG/3ML SOLN Inhale 3 mLs into the lungs every 6 (six) hours as needed for shortness of breath.     levothyroxine (SYNTHROID) 25 MCG tablet Take 25 mcg by mouth every morning.     LINZESS 290 MCG CAPS capsule Take 290 mcg by mouth daily as needed (constipation).     Omega-3 1000 MG CAPS Take 2 capsules by mouth daily.     Oxycodone HCl 20 MG TABS Take 20 mg by mouth every 8 (eight) hours as needed (pain).     pantoprazole (PROTONIX) 40 MG  tablet Take 1 tablet (40 mg total) by mouth 2 (two) times daily. 60 tablet 2   pramipexole (MIRAPEX) 0.5 MG tablet Take 0.5 mg by mouth at bedtime.     predniSONE (DELTASONE) 10 MG tablet Take 5 tablets (50 mg total) by mouth daily with breakfast. 150 tablet 1   QUEtiapine (SEROQUEL) 200 MG  tablet Take 200 mg by mouth at bedtime.     rOPINIRole (REQUIP) 0.5 MG tablet Take 0.5 mg by mouth 3 (three) times daily.     No current facility-administered medications for this visit.

## 2021-04-25 ENCOUNTER — Inpatient Hospital Stay: Payer: Medicare Other | Attending: Oncology

## 2021-04-25 ENCOUNTER — Other Ambulatory Visit: Payer: Self-pay | Admitting: Hematology and Oncology

## 2021-04-25 DIAGNOSIS — M8580 Other specified disorders of bone density and structure, unspecified site: Secondary | ICD-10-CM | POA: Insufficient documentation

## 2021-04-25 DIAGNOSIS — D693 Immune thrombocytopenic purpura: Secondary | ICD-10-CM

## 2021-04-25 DIAGNOSIS — D72829 Elevated white blood cell count, unspecified: Secondary | ICD-10-CM | POA: Insufficient documentation

## 2021-04-25 DIAGNOSIS — Z79899 Other long term (current) drug therapy: Secondary | ICD-10-CM | POA: Insufficient documentation

## 2021-04-25 DIAGNOSIS — Z9081 Acquired absence of spleen: Secondary | ICD-10-CM | POA: Insufficient documentation

## 2021-04-25 DIAGNOSIS — K760 Fatty (change of) liver, not elsewhere classified: Secondary | ICD-10-CM | POA: Insufficient documentation

## 2021-04-25 LAB — CBC AND DIFFERENTIAL
HCT: 45 (ref 36–46)
Hemoglobin: 14.5 (ref 12.0–16.0)
Neutrophils Absolute: 12.85
Platelets: 51 — AB (ref 150–399)
WBC: 14.6

## 2021-04-25 LAB — CBC: RBC: 4.48 (ref 3.87–5.11)

## 2021-05-01 ENCOUNTER — Other Ambulatory Visit: Payer: Self-pay | Admitting: Hematology and Oncology

## 2021-05-01 DIAGNOSIS — D693 Immune thrombocytopenic purpura: Secondary | ICD-10-CM

## 2021-05-02 ENCOUNTER — Other Ambulatory Visit: Payer: Self-pay | Admitting: Hematology and Oncology

## 2021-05-02 ENCOUNTER — Inpatient Hospital Stay: Payer: Medicare Other | Admitting: Hematology and Oncology

## 2021-05-02 ENCOUNTER — Ambulatory Visit: Payer: 59 | Admitting: Oncology

## 2021-05-02 ENCOUNTER — Telehealth: Payer: Self-pay

## 2021-05-02 DIAGNOSIS — D693 Immune thrombocytopenic purpura: Secondary | ICD-10-CM

## 2021-05-02 LAB — CBC AND DIFFERENTIAL
HCT: 46 (ref 36–46)
Hemoglobin: 14.8 (ref 12.0–16.0)
Neutrophils Absolute: 17.3
Platelets: 54 — AB (ref 150–399)
WBC: 18.4

## 2021-05-02 LAB — CBC: RBC: 4.54 (ref 3.87–5.11)

## 2021-05-02 MED ORDER — FLUCONAZOLE 100 MG PO TABS: 100.0000 mg | ORAL_TABLET | Freq: Every day | ORAL | 1 refills | Status: DC

## 2021-05-02 NOTE — Progress Notes (Signed)
[facility]  [address] [place],  [place]  [address] [phone]  Clinic Day:  05/09/2021  Referring physician: [name], MD  This document serves as a record of services personally performed by [name], MD. It was created on their behalf by Curry,[name], a trained medical scribe. The creation of this record is based on the scribe's personal observations and the provider's statements to them.  ASSESSMENT & PLAN:   Assessment: 1. History of ITP with multiple relapses.  Jean Kramer relapsed again while hospitalized at [facility] in July 2022, and again in August.  Her platelet count was up to 143,000 and we wanted to avoid the side effects of corticosteroids.  In mid August, her platelet count dropped again to less than 5,000 and Jean Kramer received 2 Nplate injections as well as 2 units of PRBC's and 1 unit of platelets.  Jean Kramer remains on prednisone 50 mg daily but has not responded as we hoped. I suggest was move on to IV Rituxan, weekly for 4 doses and Jean Kramer is in agreement. We will get this started on October 27th.   2. History of splenectomy, twice.   3. Chronic leukocytosis.   4. Tobacco abuse.  Jean Kramer is not motivated to quit.   5. History of lumpectomy in July 2018 which was benign.     6.  Osteopenia, stable to improved.  Jean Kramer will be due for repeat examination in December 2022, which will be scheduled through Dr. Jannette Fogo.     7.  Elevated liver transaminases.  Jean Kramer states that Dr. Jannette Fogo has evaluated her with imaging and labs and Jean Kramer was found to have fatty liver.  Plan: Jean Kramer not tolerating prednisone well and only has a partial response. We will plan to initiate weekly Rituxan for 4 doses, starting October 27th. The potential toxicities of this therapy were reviewed today and we will schedule her for an education session with Melissa prior to her start date. For now, Jean Kramer will decrease the prednisone 40 mg daily and we will continue a slow wean. We  will see her back in 2 weeks with CBC and CMP for repeat evaluation. Jean Kramer is scheduled for annual mammogram on October 28th. I advised that Jean Kramer postpone her dental cleaning. Jean Kramer understands and agrees with this plan of care. Jean Kramer knows to contact our office if Jean Kramer develops concerns prior to her next appointment.  I provided 25 minutes of face-to-face time during this this encounter and > 50% was spent counseling as documented under my assessment and plan.    [facility] [address] [place] [address] Dept: [phone] Dept Fax: [phone]   No orders of the defined types were placed in this encounter.     CHIEF COMPLAINT:  CC: Chronic ITP  Current Treatment:  Prednisone 50 mg   HISTORY OF PRESENT ILLNESS:   Oncology History   No history exists.     INTERVAL HISTORY:  Jean Kramer is here for routine follow up and continues prednisone 50 mg daily, and tolerating it poorly with only partial response. Jean Kramer reports fatigue, insomnia, brain fog, diarrhea, shortness of breath, severe edema and wheezing. Jean Kramer also reports back and leg pain. I advised that Jean Kramer decrease to 40 mg daily for now. Platelet count has only mildly increased from 54,000 to 66,000, white count has decreased from 18.4 to 15.5, and hemoglobin is normal. Her  appetite is good, and Jean Kramer has gained 3  pounds since her last visit.  Jean Kramer denies fever, chills or other signs of infection.  Jean Kramer denies nausea, vomiting, bowel issues, or abdominal pain.  Jean Kramer denies sore throat, cough, dyspnea, or chest pain.  REVIEW OF SYSTEMS:  Review of Systems  Constitutional:  Positive for fatigue. Negative for appetite change, chills, fever and unexpected weight change.  HENT:   Positive for nosebleeds (intermittent).   Eyes: Negative.   Respiratory:  Positive for shortness of breath and wheezing. Negative for chest tightness, cough and hemoptysis.   Cardiovascular: Negative.   Negative for chest pain, leg swelling and palpitations.  Gastrointestinal:  Positive for diarrhea. Negative for abdominal distention, abdominal pain, blood in stool, constipation, nausea and vomiting.  Endocrine: Negative.   Genitourinary: Negative.  Negative for difficulty urinating, dysuria, frequency and hematuria.   Musculoskeletal:  Positive for back pain (and leg pain). Negative for arthralgias, flank pain, gait problem and myalgias.  Skin: Negative.   Neurological: Negative.  Negative for dizziness, extremity weakness, gait problem, headaches, light-headedness, numbness, seizures and speech difficulty.  Hematological:  Bruises/bleeds easily.  Psychiatric/Behavioral:  Positive for sleep disturbance (insomnia due to corticosteroids). Negative for depression. The patient is not nervous/anxious.     VITALS:  Blood pressure 127/70, pulse 95, temperature 97.8 F (36.6 C), temperature source Oral, resp. rate 18, height 5\' 2"  (1.575 m), weight 181 lb 12.8 oz (82.5 kg), SpO2 96 %.  Wt Readings from Last 3 Encounters:  05/09/21 181 lb 12.8 oz (82.5 kg)  04/18/21 178 lb 8 oz (81 kg)  04/04/21 177 lb 14.2 oz (80.7 kg)    Body mass index is 33.25 kg/m.  Performance status (ECOG): 1 - Symptomatic but completely ambulatory  PHYSICAL EXAM:  Physical Exam Constitutional:      General: Jean Kramer is not in acute distress.    Appearance: Normal appearance. Jean Kramer is normal weight.  HENT:     Head: Normocephalic and atraumatic.     Comments: Facial and neck edema Eyes:     General: No scleral icterus.    Extraocular Movements: Extraocular movements intact.     Conjunctiva/sclera: Conjunctivae normal.     Pupils: Pupils are equal, round, and reactive to light.  Cardiovascular:     Rate and Rhythm: Normal rate and regular rhythm.     Pulses: Normal pulses.     Heart sounds: Normal heart sounds. No murmur heard.   No friction rub. No gallop.  Pulmonary:     Effort: Pulmonary effort is normal. No  respiratory distress.     Breath sounds: Rhonchi (expiratory in all lung fields) present.  Abdominal:     General: Bowel sounds are normal. There is no distension.     Palpations: Abdomen is soft. There is no hepatomegaly, splenomegaly or mass.     Tenderness: There is no abdominal tenderness.  Musculoskeletal:        General: Normal range of motion.     Cervical back: Normal range of motion and neck supple.     Right lower leg: No edema.     Left lower leg: No edema.  Lymphadenopathy:     Cervical: No cervical adenopathy.  Skin:    General: Skin is warm and dry.     Findings: Bruising (scattered) present.  Neurological:     General: No focal deficit present.     Mental Status: Jean Kramer is alert and oriented to person, place, and time. Mental status is at baseline.  Psychiatric:        Mood  and Affect: Mood normal.        Behavior: Behavior normal.        Thought Content: Thought content normal.        Judgment: Judgment normal.    LABS:   CBC Latest Ref Rng & Units 05/09/2021 05/02/2021 04/25/2021  WBC - 15.5 18.4 14.6  Hemoglobin 12.0 - 16.0 14.6 14.8 14.5  Hematocrit 36 - 46 46 46 45  Platelets 150 - 399 66(A) 54(A) 51(A)   CMP Latest Ref Rng & Units 04/18/2021 03/28/2021 03/21/2021  Glucose 70 - 99 mg/dL - - -  BUN 4 - 21 26(A) 26(A) 31(A)  Creatinine 0.5 - 1.1 0.8 0.8 1.1  Sodium 137 - 147 136(A) 136(A) 139  Potassium 3.4 - 5.3 4.7 5.1 4.5  Chloride 99 - 108 96(A) 100 99  CO2 13 - 22 32(A) 24(A) 31(A)  Calcium 8.7 - 10.7 9.4 9.9 9.5  Total Protein 6.5 - 8.1 g/dL - - -  Total Bilirubin 0.3 - 1.2 mg/dL - - -  Alkaline Phos 25 - 125 99 120 114  AST 13 - 35 81(A) 84(A) 167(A)  ALT 7 - 35 95(A) 113(A) 121(A)   No results found for: TOTALPROTELP, ALBUMINELP, A1GS, A2GS, BETS, BETA2SER, GAMS, MSPIKE, SPEI Lab Results  Component Value Date   TIBC 330 02/08/2021   FERRITIN 137 02/08/2021   IRONPCTSAT 11 02/08/2021   No results found for: LDH  STUDIES:  No results found.     HISTORY:   Allergies: No Known Allergies  Current Medications: Current Outpatient Medications  Medication Sig Dispense Refill   Budeson-Glycopyrrol-Formoterol (BREZTRI AEROSPHERE) 160-9-4.8 MCG/ACT AERO 2 puffs     acetaminophen (TYLENOL) 500 MG tablet Take 1,000 mg by mouth every 6 (six) hours as needed for mild pain.     alendronate (FOSAMAX) 70 MG tablet Take 70 mg by mouth once a week. Friday     ALPRAZolam (XANAX) 1 MG tablet Take 0.5-1 mg by mouth daily as needed for anxiety.     budesonide-formoterol (SYMBICORT) 160-4.5 MCG/ACT inhaler Inhale 2 puffs into the lungs 2 (two) times daily as needed (shortness of breath).     busPIRone (BUSPAR) 15 MG tablet Take 15 mg by mouth 2 (two) times daily.     calcium-vitamin D (OSCAL WITH D) 500-200 MG-UNIT TABS tablet Take 1 tablet by mouth 2 (two) times daily with a meal.     fluconazole (DIFLUCAN) 100 MG tablet Take 1 tablet (100 mg total) by mouth daily. 10 tablet 1   furosemide (LASIX) 20 MG tablet Take 20 mg by mouth daily.     ipratropium-albuterol (DUONEB) 0.5-2.5 (3) MG/3ML SOLN Inhale 3 mLs into the lungs every 6 (six) hours as needed for shortness of breath.     levothyroxine (SYNTHROID) 25 MCG tablet Take 25 mcg by mouth every morning.     LINZESS 290 MCG CAPS capsule Take 290 mcg by mouth daily as needed (constipation).     Omega-3 1000 MG CAPS Take 2 capsules by mouth daily.     Oxycodone HCl 20 MG TABS Take 20 mg by mouth every 8 (eight) hours as needed (pain).     pantoprazole (PROTONIX) 40 MG tablet Take 1 tablet (40 mg total) by mouth 2 (two) times daily. 60 tablet 2   pramipexole (MIRAPEX) 0.5 MG tablet Take 0.5 mg by mouth at bedtime.     predniSONE (DELTASONE) 10 MG tablet Take 5 tablets (50 mg total) by mouth daily with breakfast. 150 tablet 1  QUEtiapine (SEROQUEL) 200 MG tablet Take 200 mg by mouth at bedtime.     rOPINIRole (REQUIP) 0.5 MG tablet Take 0.5 mg by mouth 3 (three) times daily.     No current  facility-administered medications for this visit.    I, Rita Ohara, am acting as scribe for [name], MD  I have reviewed this report as typed by the medical scribe, and it is complete and accurate.

## 2021-05-02 NOTE — Telephone Encounter (Signed)
Patient in office today and states that she is getting the white spots in the corner of her mouth. Also she states her throat is feeling raw again. Wants to know if she can get another script for the Diflucan.

## 2021-05-09 ENCOUNTER — Other Ambulatory Visit: Payer: Self-pay

## 2021-05-09 ENCOUNTER — Other Ambulatory Visit: Payer: Self-pay | Admitting: Hematology and Oncology

## 2021-05-09 ENCOUNTER — Telehealth: Payer: Self-pay | Admitting: Oncology

## 2021-05-09 ENCOUNTER — Other Ambulatory Visit: Payer: Self-pay | Admitting: Oncology

## 2021-05-09 ENCOUNTER — Inpatient Hospital Stay (INDEPENDENT_AMBULATORY_CARE_PROVIDER_SITE_OTHER): Payer: Medicare Other | Admitting: Oncology

## 2021-05-09 ENCOUNTER — Inpatient Hospital Stay: Payer: Medicare Other

## 2021-05-09 ENCOUNTER — Encounter: Payer: Self-pay | Admitting: Oncology

## 2021-05-09 VITALS — BP 127/70 | HR 95 | Temp 97.8°F | Resp 18 | Ht 62.0 in | Wt 181.8 lb

## 2021-05-09 DIAGNOSIS — D72829 Elevated white blood cell count, unspecified: Secondary | ICD-10-CM | POA: Diagnosis not present

## 2021-05-09 DIAGNOSIS — D693 Immune thrombocytopenic purpura: Secondary | ICD-10-CM

## 2021-05-09 DIAGNOSIS — Z9081 Acquired absence of spleen: Secondary | ICD-10-CM

## 2021-05-09 DIAGNOSIS — K760 Fatty (change of) liver, not elsewhere classified: Secondary | ICD-10-CM | POA: Diagnosis not present

## 2021-05-09 DIAGNOSIS — M8580 Other specified disorders of bone density and structure, unspecified site: Secondary | ICD-10-CM | POA: Diagnosis not present

## 2021-05-09 DIAGNOSIS — Z79899 Other long term (current) drug therapy: Secondary | ICD-10-CM | POA: Diagnosis not present

## 2021-05-09 LAB — CBC AND DIFFERENTIAL
HCT: 46 (ref 36–46)
Hemoglobin: 14.6 (ref 12.0–16.0)
Neutrophils Absolute: 13.33
Platelets: 66 — AB (ref 150–399)
WBC: 15.5

## 2021-05-09 LAB — HEPATITIS B SURFACE ANTIGEN: Hepatitis B Surface Ag: NONREACTIVE

## 2021-05-09 LAB — HEPATITIS B CORE ANTIBODY, TOTAL: Hep B Core Total Ab: NONREACTIVE

## 2021-05-09 LAB — CBC
MCV: 102 — AB (ref 81–99)
RBC: 4.49 (ref 3.87–5.11)

## 2021-05-09 LAB — HEPATITIS C ANTIBODY: HCV Ab: NONREACTIVE

## 2021-05-09 NOTE — Telephone Encounter (Signed)
Per 10/21 LOS, patient scheduled for 10/24 Chemo Ed, 10/27, 11/3, 11/10, 11/17 Rituximad - 11/2 Labs, Follow Up w/Kelli.  Gave patient Appt Summary

## 2021-05-12 ENCOUNTER — Inpatient Hospital Stay (INDEPENDENT_AMBULATORY_CARE_PROVIDER_SITE_OTHER): Payer: Medicare Other | Admitting: Hematology and Oncology

## 2021-05-12 VITALS — BP 117/64 | HR 93 | Temp 98.1°F | Resp 24 | Ht 62.0 in | Wt 181.5 lb

## 2021-05-12 DIAGNOSIS — D693 Immune thrombocytopenic purpura: Secondary | ICD-10-CM | POA: Diagnosis not present

## 2021-05-12 MED ORDER — ALPRAZOLAM 0.5 MG PO TABS: 0.5000 mg | ORAL_TABLET | Freq: Three times a day (TID) | ORAL | 0 refills | Status: DC | PRN

## 2021-05-12 MED ORDER — ONDANSETRON HCL 4 MG PO TABS: 4.0000 mg | ORAL_TABLET | ORAL | 3 refills | Status: AC | PRN

## 2021-05-12 MED ORDER — PROCHLORPERAZINE MALEATE 10 MG PO TABS: 10.0000 mg | ORAL_TABLET | Freq: Four times a day (QID) | ORAL | 3 refills | Status: DC | PRN

## 2021-05-12 NOTE — Progress Notes (Signed)
Jean Kramer  [phone] [phone]  Patient Care Team: [name], MD as PCP - General (Internal Medicine)   Name of the patient: Jean Kramer  [id number]  [birth year]   Date of visit: 05/12/21  Diagnosis- Acute ITP  Chief complaint/Reason for visit- Initial Meeting for [facility], preparing for starting chemotherapy  Heme/Onc history:  Oncology History   No history exists.    Interval history-  Patient presents to chemo care clinic today for initial meeting in preparation for starting chemotherapy. I introduced the chemo care clinic and we discussed that the role of the clinic is to assist those who are at an increased risk of emergency room visits and/or complications during the course of chemotherapy treatment. We discussed that the increased risk takes into account factors such as age, performance status, and co-morbidities. We also discussed that for some, this might include barriers to care such as not having a primary care provider, lack of insurance/transportation, or not being able to afford medications. We discussed that the goal of the program is to help prevent unplanned ER visits and help reduce complications during chemotherapy. We do this by discussing specific risk factors to each individual and identifying ways that we can help improve these risk factors and reduce barriers to care.   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   Osteopenia after menopause 03/21/2021   Stroke [facility])    Tobacco abuse 03/21/2021    Past Surgical History:  Procedure Laterality Date   BREAST LUMPECTOMY Right 01/29/2017   for benign disease   spleenectomy     SPLENECTOMY      Social History   Socioeconomic History   Marital status: Divorced     Spouse name: Not on file   Number of children: Not on file   Years of education: Not on file   Highest education level: Not on file  Occupational History   Not on file  Tobacco Use   Smoking status: Every Day    Types: Cigarettes   Smokeless tobacco: Never  Substance and Sexual Activity   Alcohol use: Not on file   Drug use: Not on file   Sexual activity: Not on file  Other Topics Concern   Not on file  Social History Narrative   Not on file   Social Determinants of Health   Financial Resource Strain: Not on file  Food Insecurity: Not on file  Transportation Needs: Not on file  Physical Activity: Not on file  Stress: Not on file  Social Connections: Not on file  Intimate Partner Violence: Not on file    No family history on file.   Current Outpatient Medications:    acetaminophen (TYLENOL) 500 MG tablet, Take 1,000 mg by mouth every 6 (six) hours as needed for mild pain., Disp: , Rfl:    alendronate (FOSAMAX) 70 MG tablet, Take 70 mg by mouth once a week. Friday, Disp: , Rfl:    ALPRAZolam (XANAX) 1 MG tablet, Take 0.5-1 mg by mouth daily as needed for anxiety., Disp: , Rfl:    Budeson-Glycopyrrol-Formoterol (BREZTRI AEROSPHERE) 160-9-4.8 MCG/ACT AERO, 2 puffs, Disp: , Rfl:    budesonide-formoterol (SYMBICORT) 160-4.5 MCG/ACT inhaler, Inhale 2 puffs into the lungs 2 (two) times daily as needed (shortness of breath)., Disp: ,  Rfl:    busPIRone (BUSPAR) 15 MG tablet, Take 15 mg by mouth 2 (two) times daily., Disp: , Rfl:    calcium-vitamin D (OSCAL WITH D) 500-200 MG-UNIT TABS tablet, Take 1 tablet by mouth 2 (two) times daily with a meal., Disp: , Rfl:    fluconazole (DIFLUCAN) 100 MG tablet, Take 1 tablet (100 mg total) by mouth daily., Disp: 10 tablet, Rfl: 1   furosemide (LASIX) 20 MG tablet, Take 20 mg by mouth daily., Disp: , Rfl:    ipratropium-albuterol (DUONEB) 0.5-2.5 (3) MG/3ML SOLN, Inhale 3 mLs into the lungs every 6 (six) hours as needed for shortness of  breath., Disp: , Rfl:    levothyroxine (SYNTHROID) 25 MCG tablet, Take 25 mcg by mouth every morning., Disp: , Rfl:    LINZESS 290 MCG CAPS capsule, Take 290 mcg by mouth daily as needed (constipation)., Disp: , Rfl:    Omega-3 1000 MG CAPS, Take 2 capsules by mouth daily., Disp: , Rfl:    Oxycodone HCl 20 MG TABS, Take 20 mg by mouth every 8 (eight) hours as needed (pain)., Disp: , Rfl:    pantoprazole (PROTONIX) 40 MG tablet, Take 1 tablet (40 mg total) by mouth 2 (two) times daily., Disp: 60 tablet, Rfl: 2   pramipexole (MIRAPEX) 0.5 MG tablet, Take 0.5 mg by mouth at bedtime., Disp: , Rfl:    predniSONE (DELTASONE) 10 MG tablet, Take 5 tablets (50 mg total) by mouth daily with breakfast., Disp: 150 tablet, Rfl: 1   QUEtiapine (SEROQUEL) 200 MG tablet, Take 200 mg by mouth at bedtime., Disp: , Rfl:    rOPINIRole (REQUIP) 0.5 MG tablet, Take 0.5 mg by mouth 3 (three) times daily., Disp: , Rfl:   CMP Latest Ref Rng & Units 04/18/2021  Glucose 70 - 99 mg/dL -  BUN 4 - 21 26(A)  Creatinine 0.5 - 1.1 0.8  Sodium 137 - 147 136(A)  Potassium 3.4 - 5.3 4.7  Chloride 99 - 108 96(A)  CO2 13 - 22 32(A)  Calcium 8.7 - 10.7 9.4  Total Protein 6.5 - 8.1 g/dL -  Total Bilirubin 0.3 - 1.2 mg/dL -  Alkaline Phos 25 - 125 99  AST 13 - 35 81(A)  ALT 7 - 35 95(A)   CBC Latest Ref Rng & Units 05/09/2021  WBC - 15.5  Hemoglobin 12.0 - 16.0 14.6  Hematocrit 36 - 46 46  Platelets 150 - 399 66(A)    No images are attached to the encounter.  No results found.   Assessment and plan- Patient is a 70 y.o. female who presents to [facility] for initial meeting in preparation for starting chemotherapy for the treatment of acute ITP.   Chemo Care Clinic/High Risk for ER/Hospitalization during chemotherapy- We discussed the role of the chemo care clinic and identified patient specific risk factors. 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  Date of education: 05/12/2021 2.  Physician name: Dr. Hinton Rao 3.  Diagnosis: Acute ITP 4.  Stage: 5.  Control 6.  Chemotherapy plan including drugs and how often: Rituximab IV every 7 days x 4 cycles 7.  Start date: 05/15/2021 8.  Other referrals: None at this time 9.  The patient is to call our office with any questions or concerns.  Our office number [phone], if after hours or on the weekend, call the same number and wait for the answering service.  There is always an oncologist on call 10.  Medications prescribed: 11.  The patient has verbalized understanding of the treatment plan and has no barriers to adherence or understanding.  Obtained signed consent from patient.  Discussed symptoms including 1.  Low blood counts including red blood cells, white blood cells and platelets. 2. Infection including to avoid large crowds, wash hands frequently, and stay away from people who were sick.  If fever develops of 100.4 or higher, call our office. 3.  Mucositis-given instructions on mouth rinse (baking soda and salt mixture).  Keep mouth clean.  Use soft bristle toothbrush.  If mouth sores develop, call our clinic. 4.  Nausea/vomiting-gave prescriptions for ondansetron 4 mg every 4 hours as needed for nausea, may take around the clock if persistent.  Compazine 10 mg every 6 hours, may take around the clock if persistent. 5.  Diarrhea-use  over-the-counter Imodium.  Call clinic if not controlled. 6.  Constipation-use senna, 1 to 2 tablets twice a day.  If no BM in 2 to 3 days call the clinic. 7.  Loss of appetite-try to eat small meals every 2-3 hours.  Call clinic if not eating. 8.  Taste changes-zinc 500 mg daily.  If becomes severe call clinic. 9.  Alcoholic beverages. 10.  Drink 2 to 3 quarts of water per day. 11.  Peripheral neuropathy-patient to call if numbness or tingling in hands or feet is persistent  Answered questions to patient satisfaction.  Patient is to call with any further questions or concerns.  The medication prescribed to the patient will be printed out from Epic reference This will give the following information: Name of your medication Approved uses Dose and schedule Storage and handling Handling body fluids and waste Drug and food interactions Possible side effects and management Pregnancy, sexual activity, and contraception Obtaining medication   We discussed that social determinants of health may have significant impacts on health and outcomes for cancer patients.  Today we discussed specific social determinants of performance status, alcohol use, depression, financial needs, food insecurity, housing, interpersonal violence, social connections, stress, tobacco use, and transportation.    After lengthy discussion the following were identified as areas of need:   Outpatient services: We discussed options including home based and outpatient services, DME and care program. We discusssed that patients who participate in regular physical activity report fewer negative impacts of cancer and treatments and report less fatigue.   Financial Concerns: We discussed that living with cancer can create tremendous financial burden.  We discussed options for assistance. I asked that if assistance is needed in affording medications or paying bills to please let us know so that we can provide assistance.    Referral to Social work: Introduced Education officer, museum [employer] and the services she can provide such as support with utility bill, cell phone and gas vouchers.   Support groups: We discussed options for support groups at the cancer center. If interested, please  notify nurse navigator to enroll. We discussed options for managing stress including healthy eating, exercise as well as participating in no charge counseling services at the cancer center and support groups.  If these are of interest, patient can notify either myself or primary nursing team.We discussed options for management including medications and referral to quit Smart program  Transportation: We discussed options for transportation.  I have notified primary oncology team who will help assist with arranging Lucianne Lei transportation for appointments when/if needed. We also discussed options for transportation on short notice/acute visits.  Palliative care services: We have palliative care services available in the cancer center to discuss goals of care and advanced care planning.  Please let us know if you have any questions or would like to speak to our palliative nurse practitioner.  Symptom Management Clinic: We discussed our symptom management clinic which is available for acute concerns while receiving treatment such as nausea, vomiting or diarrhea.  We can be reached via telephone at [phone] or through my chart.  We are available for virtual or in person visits on the same day from 830 to 4 PM Monday through Friday. She denies needing specific assistance at this time and She will be followed by Dr. Hinton Rao clinical team.  Plan: Discussed symptom management clinic. Discussed palliative care services. Discussed resources that are available here at the cancer center. Discussed medications and new prescriptions to begin treatment such as anti-nausea or steroids.   Disposition: RTC on   Visit Diagnosis No diagnosis  found.  Patient expressed understanding and was in agreement with this plan. She also understands that She can call clinic at any time with any questions, concerns, or complaints.   I provided 30 minutes of  face to face  during this encounter, and > 50% was spent counseling as documented under my assessment & plan.   Dayton Scrape, FNP- [facility]

## 2021-05-13 ENCOUNTER — Encounter: Payer: Self-pay | Admitting: Oncology

## 2021-05-13 ENCOUNTER — Encounter: Payer: Self-pay | Admitting: Hematology and Oncology

## 2021-05-15 ENCOUNTER — Other Ambulatory Visit: Payer: Self-pay

## 2021-05-15 ENCOUNTER — Encounter: Payer: Self-pay | Admitting: Oncology

## 2021-05-15 ENCOUNTER — Inpatient Hospital Stay: Payer: Medicare Other

## 2021-05-15 VITALS — BP 101/58 | HR 104 | Temp 98.1°F | Resp 22 | Ht 62.0 in | Wt 184.0 lb

## 2021-05-15 DIAGNOSIS — D693 Immune thrombocytopenic purpura: Secondary | ICD-10-CM

## 2021-05-15 MED ORDER — SODIUM CHLORIDE 0.9 % IV SOLN: Freq: Once | INTRAVENOUS | Status: AC

## 2021-05-15 MED ORDER — DIPHENHYDRAMINE HCL 25 MG PO CAPS
50.0000 mg | ORAL_CAPSULE | Freq: Once | ORAL | Status: AC
  Administered 2021-05-15: 50 mg via ORAL
  Filled 2021-05-15: qty 2

## 2021-05-15 MED ORDER — RITUXIMAB-PVVR CHEMO 500 MG/50ML IV SOLN
375.0000 mg/m2 | Freq: Once | INTRAVENOUS | Status: AC
  Administered 2021-05-15: 700 mg via INTRAVENOUS
  Filled 2021-05-15: qty 50

## 2021-05-15 MED ORDER — FUROSEMIDE 10 MG/ML IJ SOLN
20.0000 mg | Freq: Once | INTRAMUSCULAR | Status: AC
  Administered 2021-05-15: 20 mg via INTRAVENOUS
  Filled 2021-05-15: qty 2

## 2021-05-15 MED ORDER — ACETAMINOPHEN 325 MG PO TABS
650.0000 mg | ORAL_TABLET | Freq: Once | ORAL | Status: AC
  Administered 2021-05-15: 650 mg via ORAL
  Filled 2021-05-15: qty 2

## 2021-05-15 NOTE — Progress Notes (Signed)
IV site to right forearm has bruising present at insertion area, about the size of a nickel.  Pressure dressing applied when IV removed, bleeding has stopped, and I instructed pt to leave this dressing on for 24 hours.  Pt has low platelet count, and understands being cautious to prevent bleeding.

## 2021-05-15 NOTE — Patient Instructions (Signed)
Rituximab Injection What is this medication? RITUXIMAB (ri TUX i mab) is a monoclonal antibody. It is used to treat certain types of cancer like non-Hodgkin lymphoma and chronic lymphocytic leukemia. It is also used to treat rheumatoid arthritis, granulomatosis with polyangiitis,microscopic polyangiitis, and pemphigus vulgaris. This medicine may be used for other purposes; ask your health care provider orpharmacist if you have questions. COMMON BRAND NAME(S): RIABNI, Rituxan, RUXIENCE What should I tell my care team before I take this medication? They need to know if you have any of these conditions: chest pain heart disease infection especially a viral infection such as chickenpox, cold sores, hepatitis B, or herpes immune system problems irregular heartbeat or rhythm kidney disease low blood counts (white cells, platelets, or red cells) lung disease recent or upcoming vaccine an unusual or allergic reaction to rituximab, other medicines, foods, dyes, or preservatives pregnant or trying to get pregnant breast-feeding How should I use this medication? This medicine is injected into a vein. It is given by a health care provider ina hospital or clinic setting. A special MedGuide will be given to you before each treatment. Be sure to readthis information carefully each time. Talk to your health care provider about the use of this medicine in children. While this drug may be prescribed for children as young as 6 months forselected conditions, precautions do apply. Overdosage: If you think you have taken too much of this medicine contact apoison control center or emergency room at once. NOTE: This medicine is only for you. Do not share this medicine with others. What if I miss a dose? Keep appointments for follow-up doses. It is important not to miss your dose.Call your health care provider if you are unable to keep an appointment. What may interact with this medication? Do not take this  medicine with any of the following medicines: live vaccines This medicine may also interact with the following medicines: cisplatin This list may not describe all possible interactions. Give your health care provider a list of all the medicines, herbs, non-prescription drugs, or dietary supplements you use. Also tell them if you smoke, drink alcohol, or use illegaldrugs. Some items may interact with your medicine. What should I watch for while using this medication? Your condition will be monitored carefully while you are receiving thismedicine. You may need blood work done while you are taking this medicine. This medicine can cause serious infusion reactions. To reduce the risk your health care provider may give you other medicines to take before receiving thisone. Be sure to follow the directions from your health care provider. This medicine may increase your risk of getting an infection. Call your health care provider for advice if you get a fever, chills, sore throat, or other symptoms of a cold or flu. Do not treat yourself. Try to avoid being aroundpeople who are sick. Call your health care provider if you are around anyone with measles,chickenpox, or if you develop sores or blisters that do not heal properly. Avoid taking medicines that contain aspirin, acetaminophen, ibuprofen, naproxen, or ketoprofen unless instructed by your health care provider. Thesemedicines may hide a fever. This medicine may cause serious skin reactions. They can happen weeks to months after starting the medicine. Contact your health care provider right away if you notice fevers or flu-like symptoms with a rash. The rash may be red or purple and then turn into blisters or peeling of the skin. Or, you might notice a red rash with swelling of the face, lips or lymph nodes   in your neck or underyour arms. In some patients, this medicine may cause a serious brain infection that may cause death. If you have any problems seeing,  thinking, speaking, walking, or standing, tell your healthcare professional right away. If you cannot reachyour healthcare professional, urgently seek other source of medical care. Do not become pregnant while taking this medicine or for at least 12 months after stopping it. Women should inform their health care provider if they wish to become pregnant or think they might be pregnant. There is potential for serious harm to an unborn child. Talk to your health care provider for more information. Women should use a reliable form of birth control while taking this medicine and for 12 months after stopping it. Do not breast-feed whiletaking this medicine or for at least 6 months after stopping it. What side effects may I notice from receiving this medication? Side effects that you should report to your health care provider as soon aspossible: allergic reactions (skin rash, itching or hives; swelling of the face, lips, or tongue) diarrhea edema (sudden weight gain; swelling of the ankles, feet, hands or other unusual swelling; trouble breathing) fast, irregular heartbeat heart attack (trouble breathing; pain or tightness in the chest, neck, back or arms; unusually weak or tired) infection (fever, chills, cough, sore throat, pain or trouble passing urine) kidney injury (trouble passing urine or change in the amount of urine) liver injury (dark yellow or brown urine; general ill feeling or flu-like symptoms; loss of appetite, right upper belly pain; unusually weak or tired, yellowing of the eyes or skin) low blood pressure (dizziness; feeling faint or lightheaded, falls; unusually weak or tired) low red blood cell counts (trouble breathing; feeling faint; lightheaded, falls; unusually weak or tired) mouth sores redness, blistering, peeling, or loosening of the skin, including inside the mouth stomach pain unusual bruising or bleeding wheezing (trouble breathing with loud or whistling  sounds) vomiting Side effects that usually do not require medical attention (report to yourhealth care provider if they continue or are bothersome): headache joint pain muscle cramps, pain nausea This list may not describe all possible side effects. Call your doctor for medical advice about side effects. You may report side effects to FDA at1-800-FDA-1088. Where should I keep my medication? This medicine is given in a hospital or clinic. It will not be stored at home. NOTE: This sheet is a summary. It may not cover all possible information. If you have questions about this medicine, talk to your doctor, pharmacist, orhealth care provider.  2022 Elsevier/Gold Standard (2020-06-27 15:47:26)  

## 2021-05-16 ENCOUNTER — Encounter: Payer: Self-pay | Admitting: Oncology

## 2021-05-16 ENCOUNTER — Telehealth: Payer: Self-pay

## 2021-05-16 NOTE — Telephone Encounter (Signed)
I spoke with pt. She states, "I'm alright. I just got back from having my mammogram". Pt denies N/V, diarrhea, fevers, sob, and skin changes. I reminded pt to call us if she develops temp of 100.4 or higher, Day or Night. She verbalized understanding. I confirmed next appt with pt.

## 2021-05-16 NOTE — Progress Notes (Signed)
..  Pharmacist Chemotherapy Monitoring - Initial Assessment    Anticipated start date: 05/15/21  The following has been reviewed per standard work regarding the patient's treatment regimen: The patient's diagnosis, treatment plan and drug doses, and organ/hematologic function Lab orders and baseline tests specific to treatment regimen  The treatment plan start date, drug sequencing, and pre-medications Prior authorization status  Patient's documented medication list, including drug-drug interaction screen and prescriptions for anti-emetics and supportive care specific to the treatment regimen The drug concentrations, fluid compatibility, administration routes, and timing of the medications to be used The patient's access for treatment and lifetime cumulative dose history, if applicable  The patient's medication allergies and previous infusion related reactions, if applicable   Changes made to treatment plan:  N/A  Follow up needed:  N/A   Jean Kramer, [facility], 05/09/2021  10:25 AM

## 2021-05-18 ENCOUNTER — Encounter: Payer: Self-pay | Admitting: Oncology

## 2021-05-21 ENCOUNTER — Encounter: Payer: Self-pay | Admitting: Hematology and Oncology

## 2021-05-21 ENCOUNTER — Inpatient Hospital Stay (INDEPENDENT_AMBULATORY_CARE_PROVIDER_SITE_OTHER): Payer: Medicare Other | Admitting: Hematology and Oncology

## 2021-05-21 ENCOUNTER — Telehealth: Payer: Self-pay | Admitting: Hematology and Oncology

## 2021-05-21 ENCOUNTER — Inpatient Hospital Stay: Payer: Medicare Other | Attending: Oncology

## 2021-05-21 DIAGNOSIS — Z5111 Encounter for antineoplastic chemotherapy: Secondary | ICD-10-CM | POA: Insufficient documentation

## 2021-05-21 DIAGNOSIS — D693 Immune thrombocytopenic purpura: Secondary | ICD-10-CM | POA: Diagnosis not present

## 2021-05-21 DIAGNOSIS — Z79899 Other long term (current) drug therapy: Secondary | ICD-10-CM | POA: Insufficient documentation

## 2021-05-21 LAB — BASIC METABOLIC PANEL
BUN: 34 — AB (ref 4–21)
CO2: 30 — AB (ref 13–22)
Chloride: 102 (ref 99–108)
Creatinine: 0.9 (ref 0.5–1.1)
Glucose: 110
Potassium: 4 (ref 3.4–5.3)
Sodium: 141 (ref 137–147)

## 2021-05-21 LAB — HEPATIC FUNCTION PANEL
ALT: 85 — AB (ref 7–35)
AST: 75 — AB (ref 13–35)
Alkaline Phosphatase: 98 (ref 25–125)
Bilirubin, Total: 0.6

## 2021-05-21 LAB — COMPREHENSIVE METABOLIC PANEL WITH GFR
Albumin: 4.1 (ref 3.5–5.0)
Calcium: 9.9 (ref 8.7–10.7)

## 2021-05-21 LAB — CBC AND DIFFERENTIAL
HCT: 44 (ref 36–46)
Hemoglobin: 14.5 (ref 12.0–16.0)
Neutrophils Absolute: 12.87
Platelets: 110 — AB (ref 150–399)
WBC: 14.3

## 2021-05-21 LAB — CBC: RBC: 4.41 (ref 3.87–5.11)

## 2021-05-21 MED FILL — Rituximab-pvvr IV Soln 500 MG/50ML (10 MG/ML): INTRAVENOUS | Qty: 70 | Status: AC

## 2021-05-21 NOTE — Telephone Encounter (Signed)
Per 11/2 los next appt scheduled and given to patient 

## 2021-05-21 NOTE — Progress Notes (Signed)
Patient Care Team: [name], MD as PCP - General (Internal Medicine) [name], MD as Consulting Physician (Oncology)  Clinic Day:  05/21/2021  Referring physician: [name], MD  ASSESSMENT & PLAN:   Assessment & Plan: Chronic ITP (idiopathic thrombocytopenia) (HCC) Patient was up to 60 mg Prednisone daily with multiple side effects and little change in platelets. We began a slow wean of her prednisone. She received her first dose of rituximab last week and tolerated well. Her platelets are up to 110 today and we have weaned her prednisone to 30 mg daily.She will return to clinic in one week for repeat evaluation.    The patient understands the plans discussed today and is in agreement with them.  She knows to contact our office if she develops concerns prior to her next appointment.    [name], NP  [facility] [address] [place] [address] Dept: [phone] Dept Fax: [phone]   No orders of the defined types were placed in this encounter.     CHIEF COMPLAINT:  CC: A 70 year old female with history of chronic ITP here for one week evaluation  Current Treatment:  Rituximab  INTERVAL HISTORY:  Jean Kramer is here today for repeat clinical assessment. She denies fevers or chills. She denies pain. Her appetite is good. Her weight has been stable.  I have reviewed the past medical history, past surgical history, social history and family history with the patient and they are unchanged from previous note.  ALLERGIES:  has No Known Allergies.  MEDICATIONS:  Current Outpatient Medications  Medication Sig Dispense Refill   acetaminophen (TYLENOL) 500 MG tablet Take 1,000 mg by mouth every 6 (six) hours as needed for mild pain.     alendronate (FOSAMAX) 70 MG tablet Take 70 mg by mouth once a week. Friday     ALPRAZolam (XANAX) 0.5 MG tablet Take 1 tablet (0.5 mg total) by mouth  3 (three) times daily as needed for anxiety. 90 tablet 0   Budeson-Glycopyrrol-Formoterol (BREZTRI AEROSPHERE) 160-9-4.8 MCG/ACT AERO 2 puffs     budesonide-formoterol (SYMBICORT) 160-4.5 MCG/ACT inhaler Inhale 2 puffs into the lungs 2 (two) times daily as needed (shortness of breath).     busPIRone (BUSPAR) 15 MG tablet Take 15 mg by mouth 2 (two) times daily.     calcium-vitamin D (OSCAL WITH D) 500-200 MG-UNIT TABS tablet Take 1 tablet by mouth 2 (two) times daily with a meal.     furosemide (LASIX) 20 MG tablet Take 20 mg by mouth daily.     ipratropium-albuterol (DUONEB) 0.5-2.5 (3) MG/3ML SOLN Inhale 3 mLs into the lungs every 6 (six) hours as needed for shortness of breath.     levothyroxine (SYNTHROID) 25 MCG tablet Take 25 mcg by mouth every morning.     LINZESS 290 MCG CAPS capsule Take 290 mcg by mouth daily as needed (constipation).     Omega-3 1000 MG CAPS Take 2 capsules by mouth daily.     ondansetron (ZOFRAN) 4 MG tablet Take 1 tablet (4 mg total) by mouth every 4 (four) hours as needed for nausea. 90 tablet 3   Oxycodone HCl 20 MG TABS Take 20 mg by mouth every 8 (eight) hours as needed (pain).     pantoprazole (PROTONIX) 40 MG tablet Take 1 tablet (40 mg total) by mouth 2 (two) times daily. 60 tablet 2   pramipexole (MIRAPEX) 0.5 MG tablet Take 0.5  mg by mouth at bedtime.     predniSONE (DELTASONE) 10 MG tablet Take 5 tablets (50 mg total) by mouth daily with breakfast. 150 tablet 1   prochlorperazine (COMPAZINE) 10 MG tablet Take 1 tablet (10 mg total) by mouth every 6 (six) hours as needed for nausea or vomiting. 90 tablet 3   QUEtiapine (SEROQUEL) 200 MG tablet Take 200 mg by mouth at bedtime.     rOPINIRole (REQUIP) 0.5 MG tablet Take 0.5 mg by mouth 3 (three) times daily.     No current facility-administered medications for this visit.    HISTORY OF PRESENT ILLNESS:   Oncology History   No history exists.      REVIEW OF SYSTEMS:   Constitutional: Denies fevers,  chills or abnormal weight loss Eyes: Denies blurriness of vision Ears, nose, mouth, throat, and face: Denies mucositis or sore throat Respiratory: Denies cough, dyspnea or wheezes Cardiovascular: Denies palpitation, chest discomfort or lower extremity swelling Gastrointestinal:  Denies nausea, heartburn or change in bowel habits Skin: Denies abnormal skin rashes Lymphatics: Denies new lymphadenopathy or easy bruising Neurological:Denies numbness, tingling or new weaknesses Behavioral/Psych: Mood is stable, no new changes  All other systems were reviewed with the patient and are negative.   VITALS:  Blood pressure 114/75, pulse 95, temperature 98.1 F (36.7 C), resp. rate 20, height 5\' 2"  (1.575 m), weight 182 lb 8 oz (82.8 kg), SpO2 95 %.  Wt Readings from Last 3 Encounters:  05/21/21 182 lb 8 oz (82.8 kg)  05/15/21 184 lb (83.5 kg)  05/12/21 181 lb 8 oz (82.3 kg)    Body mass index is 33.38 kg/m.  Performance status (ECOG): 1 - Symptomatic but completely ambulatory  PHYSICAL EXAM:   GENERAL:alert, no distress and comfortable SKIN: skin color, texture, turgor are normal, no rashes or significant lesions EYES: normal, Conjunctiva are pink and non-injected, sclera clear OROPHARYNX:no exudate, no erythema and lips, buccal mucosa, and tongue normal  NECK: supple, thyroid normal size, non-tender, without nodularity LYMPH:  no palpable lymphadenopathy in the cervical, axillary or inguinal LUNGS: clear to auscultation and percussion with normal breathing effort HEART: regular rate & rhythm and no murmurs and no lower extremity edema ABDOMEN:abdomen soft, non-tender and normal bowel sounds Musculoskeletal:no cyanosis of digits and no clubbing  NEURO: alert & oriented x 3 with fluent speech, no focal motor/sensory deficits  LABORATORY DATA:  I have reviewed the data as listed    Component Value Date/Time   NA 141 05/21/2021 0000   K 4.0 05/21/2021 0000   CL 102 05/21/2021 0000    CO2 30 (A) 05/21/2021 0000   GLUCOSE 203 (H) 03/06/2021 0350   BUN 34 (A) 05/21/2021 0000   CREATININE 0.9 05/21/2021 0000   CREATININE 0.95 03/06/2021 0350   CALCIUM 9.9 05/21/2021 0000   PROT 6.8 03/06/2021 0350   ALBUMIN 4.1 05/21/2021 0000   AST 75 (A) 05/21/2021 0000   ALT 85 (A) 05/21/2021 0000   ALKPHOS 98 05/21/2021 0000   BILITOT 0.6 03/06/2021 0350   GFRNONAA >60 03/06/2021 0350    No results found for: SPEP, UPEP  Lab Results  Component Value Date   WBC 14.3 05/21/2021   NEUTROABS 12.87 05/21/2021   HGB 14.5 05/21/2021   HCT 44 05/21/2021   MCV 102 (A) 05/09/2021   PLT 110 (A) 05/21/2021      Chemistry      Component Value Date/Time   NA 141 05/21/2021 0000   K 4.0 05/21/2021 0000  CL 102 05/21/2021 0000   CO2 30 (A) 05/21/2021 0000   BUN 34 (A) 05/21/2021 0000   CREATININE 0.9 05/21/2021 0000   CREATININE 0.95 03/06/2021 0350   GLU 110 05/21/2021 0000      Component Value Date/Time   CALCIUM 9.9 05/21/2021 0000   ALKPHOS 98 05/21/2021 0000   AST 75 (A) 05/21/2021 0000   ALT 85 (A) 05/21/2021 0000   BILITOT 0.6 03/06/2021 0350

## 2021-05-21 NOTE — Assessment & Plan Note (Signed)
Patient was up to 60 mg Prednisone daily with multiple side effects and little change in platelets. We began a slow wean of her prednisone. She received her first dose of rituximab last week and tolerated well. Her platelets are up to 110 today and we have weaned her prednisone to 30 mg daily.She will return to clinic in one week for repeat evaluation.

## 2021-05-22 ENCOUNTER — Inpatient Hospital Stay: Payer: Medicare Other

## 2021-05-22 ENCOUNTER — Other Ambulatory Visit: Payer: Self-pay

## 2021-05-22 ENCOUNTER — Encounter: Payer: Self-pay | Admitting: Oncology

## 2021-05-22 VITALS — BP 110/68 | HR 93 | Temp 97.9°F | Resp 20 | Ht 62.0 in | Wt 188.0 lb

## 2021-05-22 DIAGNOSIS — Z79899 Other long term (current) drug therapy: Secondary | ICD-10-CM | POA: Diagnosis not present

## 2021-05-22 DIAGNOSIS — D693 Immune thrombocytopenic purpura: Secondary | ICD-10-CM | POA: Diagnosis present

## 2021-05-22 DIAGNOSIS — Z5111 Encounter for antineoplastic chemotherapy: Secondary | ICD-10-CM | POA: Diagnosis not present

## 2021-05-22 MED ORDER — SODIUM CHLORIDE 0.9 % IV SOLN: Freq: Once | INTRAVENOUS | Status: AC

## 2021-05-22 MED ORDER — ACETAMINOPHEN 325 MG PO TABS
650.0000 mg | ORAL_TABLET | Freq: Once | ORAL | Status: AC
  Administered 2021-05-22: 650 mg via ORAL
  Filled 2021-05-22: qty 2

## 2021-05-22 MED ORDER — DIPHENHYDRAMINE HCL 25 MG PO CAPS
50.0000 mg | ORAL_CAPSULE | Freq: Once | ORAL | Status: AC
  Administered 2021-05-22: 50 mg via ORAL
  Filled 2021-05-22: qty 2

## 2021-05-22 MED ORDER — SODIUM CHLORIDE 0.9 % IV SOLN
375.0000 mg/m2 | Freq: Once | INTRAVENOUS | Status: AC
  Administered 2021-05-22: 700 mg via INTRAVENOUS
  Filled 2021-05-22: qty 50

## 2021-05-22 NOTE — Progress Notes (Signed)
1257:PT STABLE AT TIME OF DISCHARGE ?

## 2021-05-22 NOTE — Patient Instructions (Signed)
[facility]  Discharge Instructions: Thank you for choosing [facility] to provide your oncology and hematology care.  If you have a lab appointment with the [facility], please go directly to the [facility] and check in at the registration area.   Wear comfortable clothing and clothing appropriate for easy access to any Portacath or PICC line.   We strive to give you quality time with your provider. You may need to reschedule your appointment if you arrive late (15 or more minutes).  Arriving late affects you and other patients whose appointments are after yours.  Also, if you miss three or more appointments without notifying the office, you may be dismissed from the clinic at the provider's discretion.      For prescription refill requests, have your pharmacy contact our office and allow 72 hours for refills to be completed.    Today you received the following chemotherapy and/or immunotherapy agents Rituximab     To help prevent nausea and vomiting after your treatment, we encourage you to take your nausea medication as directed.  BELOW ARE SYMPTOMS THAT SHOULD BE REPORTED IMMEDIATELY: *FEVER GREATER THAN 100.4 F (38 C) OR HIGHER *CHILLS OR SWEATING *NAUSEA AND VOMITING THAT IS NOT CONTROLLED WITH YOUR NAUSEA MEDICATION *UNUSUAL SHORTNESS OF BREATH *UNUSUAL BRUISING OR BLEEDING *URINARY PROBLEMS (pain or burning when urinating, or frequent urination) *BOWEL PROBLEMS (unusual diarrhea, constipation, pain near the anus) TENDERNESS IN MOUTH AND THROAT WITH OR WITHOUT PRESENCE OF ULCERS (sore throat, sores in mouth, or a toothache) UNUSUAL RASH, SWELLING OR PAIN  UNUSUAL VAGINAL DISCHARGE OR ITCHING   Items with * indicate a potential emergency and should be followed up as soon as possible or go to the Emergency Department if any problems should occur.  Please show the CHEMOTHERAPY ALERT CARD or IMMUNOTHERAPY ALERT CARD at check-in to the  Emergency Department and triage nurse.  Should you have questions after your visit or need to cancel or reschedule your appointment, please contact [facility]  Dept: [phone]  and follow the prompts.  Office hours are 8:00 a.m. to 4:30 p.m. Monday - Friday. Please note that voicemails left after 4:00 p.m. may not be returned until the following business day.  We are closed weekends and major holidays. You have access to a nurse at all times for urgent questions. Please call the main number to the clinic Dept: [phone] and follow the prompts.  For any non-urgent questions, you may also contact your provider using MyChart. We now offer e-Visits for anyone 69 and older to request care online for non-urgent symptoms. For details visit [email or web address].   Also download the MyChart app! Go to the app store, search "MyChart", open the app, select [facility], and log in with your MyChart username and password.  Due to Covid, a mask is required upon entering the hospital/clinic. If you do not have a mask, one will be given to you upon arrival. For doctor visits, patients may have 1 support person aged 40 or older with them. For treatment visits, patients cannot have anyone with them due to current Covid guidelines and our immunocompromised population.

## 2021-05-28 ENCOUNTER — Inpatient Hospital Stay (INDEPENDENT_AMBULATORY_CARE_PROVIDER_SITE_OTHER): Payer: Medicare Other | Admitting: Hematology and Oncology

## 2021-05-28 ENCOUNTER — Encounter: Payer: Self-pay | Admitting: Hematology and Oncology

## 2021-05-28 ENCOUNTER — Inpatient Hospital Stay: Payer: Medicare Other

## 2021-05-28 DIAGNOSIS — D693 Immune thrombocytopenic purpura: Secondary | ICD-10-CM | POA: Diagnosis not present

## 2021-05-28 LAB — BASIC METABOLIC PANEL
BUN: 22 — AB (ref 4–21)
CO2: 27 — AB (ref 13–22)
Chloride: 102 (ref 99–108)
Creatinine: 0.8 (ref 0.5–1.1)
Glucose: 127
Potassium: 4.3 (ref 3.4–5.3)
Sodium: 140 (ref 137–147)

## 2021-05-28 LAB — CBC AND DIFFERENTIAL
HCT: 46 (ref 36–46)
Hemoglobin: 15.6 (ref 12.0–16.0)
Neutrophils Absolute: 13.56
Platelets: 115 — AB (ref 150–399)
WBC: 14.9

## 2021-05-28 LAB — HEPATIC FUNCTION PANEL
ALT: 101 — AB (ref 7–35)
AST: 88 — AB (ref 13–35)
Alkaline Phosphatase: 128 — AB (ref 25–125)
Bilirubin, Total: 0.4

## 2021-05-28 LAB — COMPREHENSIVE METABOLIC PANEL
Albumin: 4.3 (ref 3.5–5.0)
Calcium: 10 (ref 8.7–10.7)

## 2021-05-28 LAB — CBC: RBC: 4.65 (ref 3.87–5.11)

## 2021-05-28 NOTE — Progress Notes (Signed)
[name]  [address] [place],  [place]  [address] [phone]  Clinic Day:  06/04/2021  Referring physician: [name], MD  This document serves as a record of services personally performed by [name], MD. It was created on their behalf by Curry,[name], a trained medical scribe. The creation of this record is based on the scribe's personal observations and the provider's statements to them.  ASSESSMENT & PLAN:   Assessment: 1. History of ITP with multiple relapses.  She relapsed again while hospitalized at [facility] in July 2022, and again in August.  Her platelet count was up to 143,000 and we wanted to avoid the side effects of corticosteroids.  In mid August, her platelet count dropped again to less than 5,000 and she received 2 Nplate injections as well as 2 units of PRBC's and 1 unit of platelets.  She was placed on prednisone starting at 60 mg daily, but had not responded as we hoped. She is currently receiving IV Rituxan weekly and will have her 4th dose tomorrow. She is steadily weaning off prednisone.   2. History of splenectomy, twice.   3. Chronic leukocytosis.   4. Tobacco abuse.  She is not motivated to quit.   5. History of lumpectomy in July 2018 which was benign.     6.  Osteopenia, stable to improved.  She will be due for repeat examination in December 2022, which will be scheduled through Dr. Jannette Fogo.     7.  Elevated liver transaminases, which have worsened.  She states that Dr. Jannette Fogo has evaluated her with imaging and labs and she was found to have fatty liver. She has been taking Tylenol 1000 mg TID, and I advised that she cut back to 500 mg TID. We may need to eliminate Tylenol all together, but I doubt that this is really the cause of her abnormal liver function tests.  Plan: She will proceed with a 4th and final cycle of Rituxan this week. She will decrease the prednisone from 20 mg to 10 mg daily and we  will continue a steady wean.  She will return to the clinic next week for CBC. We will see her back in 2 weeks with CBC and CMP for repeat evaluation. She understands and agrees with this plan of care. She knows to contact our office if she develops concerns prior to her next appointment.  I provided 20 minutes of face-to-face time during this this encounter and > 50% was spent counseling as documented under my assessment and plan.    [facility] [address] [place] [address] Dept: [phone] Dept Fax: [phone]   No orders of the defined types were placed in this encounter.     CHIEF COMPLAINT:  CC: Chronic ITP  Current Treatment:  Rituxan weekly for 4 doses, slowly weaning off corticosteroids   HISTORY OF PRESENT ILLNESS:   Oncology History   No history exists.     INTERVAL HISTORY:  Jean Kramer is here for routine follow up prior to a 4th and final cycle of weekly Rituxan. She has tapered down to prednisone 20 mg daily. She states that she has been doing fairly well. She is taking one breathing treatment daily. Platelets have improved from 115,000 to 142,000, white count is 16.2 secondary to corticosteroids, and hemoglobin is normal. Chemistries are unremarkable except for a BUN of 28, and elevated liver transaminases, worsening. She has  been taking Tylenol as the oxycodone 20 mg is not lasting 8 hours. I advised that she cut back on the Tylenol by half, and only take 500 mg TID at most. Her  appetite is good, and she has gained 1 and 1/2 pounds since her last visit.  She denies fever, chills or other signs of infection.  She denies nausea, vomiting, bowel issues, or abdominal pain.  She denies sore throat, cough, dyspnea, or chest pain.  REVIEW OF SYSTEMS:  Review of Systems  Constitutional: Negative.  Negative for appetite change, chills, fatigue, fever and unexpected weight change.  HENT:  Negative.    Eyes:  Negative.   Respiratory: Negative.  Negative for chest tightness, cough, hemoptysis, shortness of breath and wheezing.   Cardiovascular:  Positive for leg swelling (improved). Negative for chest pain and palpitations.  Gastrointestinal: Negative.  Negative for abdominal distention, abdominal pain, blood in stool, constipation, diarrhea, nausea and vomiting.  Endocrine: Negative.   Genitourinary: Negative.  Negative for difficulty urinating, dysuria, frequency and hematuria.   Musculoskeletal:  Positive for back pain (chronic, as well as chronic leg pain) and gait problem (unsteady, uses a cane to ambulate). Negative for arthralgias, flank pain and myalgias.  Skin: Negative.   Neurological:  Positive for gait problem (unsteady, uses a cane to ambulate). Negative for dizziness, extremity weakness, headaches, light-headedness, numbness, seizures and speech difficulty.  Hematological: Negative.   Psychiatric/Behavioral: Negative.  Negative for depression and sleep disturbance. The patient is not nervous/anxious.     VITALS:  Blood pressure (!) 101/59, pulse 98, temperature 98.1 F (36.7 C), temperature source Oral, resp. rate 18, height 5\' 2"  (1.575 m), weight 187 lb (84.8 kg), SpO2 96 %.  Wt Readings from Last 3 Encounters:  06/04/21 187 lb (84.8 kg)  05/29/21 188 lb (85.3 kg)  05/28/21 185 lb 9.6 oz (84.2 kg)    Body mass index is 34.2 kg/m.  Performance status (ECOG): 1 - Symptomatic but completely ambulatory  PHYSICAL EXAM:  Physical Exam Constitutional:      General: She is not in acute distress.    Appearance: Normal appearance. She is normal weight.  HENT:     Head: Normocephalic and atraumatic.     Comments: Moon facies  Eyes:     General: No scleral icterus.    Extraocular Movements: Extraocular movements intact.     Conjunctiva/sclera: Conjunctivae normal.     Pupils: Pupils are equal, round, and reactive to light.  Cardiovascular:     Rate and Rhythm: Normal rate and  regular rhythm.     Pulses: Normal pulses.     Heart sounds: Normal heart sounds. No murmur heard.   No friction rub. No gallop.  Pulmonary:     Effort: Pulmonary effort is normal. No respiratory distress.     Breath sounds: Normal breath sounds.  Abdominal:     General: Bowel sounds are normal. There is no distension.     Palpations: Abdomen is soft. There is no hepatomegaly, splenomegaly or mass.     Tenderness: There is no abdominal tenderness.  Musculoskeletal:        General: Normal range of motion.     Cervical back: Normal range of motion and neck supple.     Right lower leg: No edema.     Left lower leg: No edema.     Comments: generalized edema but not quite as severe as it was last month  Lymphadenopathy:     Cervical: No cervical adenopathy.  Skin:  General: Skin is warm and dry.  Neurological:     General: No focal deficit present.     Mental Status: She is alert and oriented to person, place, and time. Mental status is at baseline.  Psychiatric:        Mood and Affect: Mood normal.        Behavior: Behavior normal.        Thought Content: Thought content normal.        Judgment: Judgment normal.    LABS:   CBC Latest Ref Rng & Units 06/04/2021 05/28/2021 05/21/2021  WBC - 16.2 14.9 14.3  Hemoglobin 12.0 - 16.0 14.4 15.6 14.5  Hematocrit 36 - 46 42 46 44  Platelets 150 - 399 142(A) 115(A) 110(A)   CMP Latest Ref Rng & Units 06/04/2021 05/28/2021 05/21/2021  Glucose 70 - 99 mg/dL - - -  BUN 4 - 21 28(A) 22(A) 34(A)  Creatinine 0.5 - 1.1 0.8 0.8 0.9  Sodium 137 - 147 140 140 141  Potassium 3.4 - 5.3 4.6 4.3 4.0  Chloride 99 - 108 100 102 102  CO2 13 - 22 30(A) 27(A) 30(A)  Calcium 8.7 - 10.7 10.0 10.0 9.9  Total Protein 6.5 - 8.1 g/dL - - -  Total Bilirubin 0.3 - 1.2 mg/dL - - -  Alkaline Phos 25 - 125 116 128(A) 98  AST 13 - 35 137(A) 88(A) 75(A)  ALT 7 - 35 144(A) 101(A) 85(A)    Lab Results  Component Value Date   TIBC 330 02/08/2021   FERRITIN 137  02/08/2021   IRONPCTSAT 11 02/08/2021   No results found for: LDH  STUDIES:  No results found.    HISTORY:   Allergies: No Known Allergies  Current Medications: Current Outpatient Medications  Medication Sig Dispense Refill   acetaminophen (TYLENOL) 500 MG tablet Take 1,000 mg by mouth every 6 (six) hours as needed for mild pain.     alendronate (FOSAMAX) 70 MG tablet Take 70 mg by mouth once a week. Friday     ALPRAZolam (XANAX) 0.5 MG tablet Take 1 tablet (0.5 mg total) by mouth 3 (three) times daily as needed for anxiety. 90 tablet 0   Budeson-Glycopyrrol-Formoterol (BREZTRI AEROSPHERE) 160-9-4.8 MCG/ACT AERO 2 puffs     budesonide-formoterol (SYMBICORT) 160-4.5 MCG/ACT inhaler Inhale 2 puffs into the lungs 2 (two) times daily as needed (shortness of breath).     busPIRone (BUSPAR) 15 MG tablet Take 15 mg by mouth 2 (two) times daily.     calcium-vitamin D (OSCAL WITH D) 500-200 MG-UNIT TABS tablet Take 1 tablet by mouth 2 (two) times daily with a meal.     furosemide (LASIX) 20 MG tablet Take 20 mg by mouth daily.     ipratropium-albuterol (DUONEB) 0.5-2.5 (3) MG/3ML SOLN Inhale 3 mLs into the lungs every 6 (six) hours as needed for shortness of breath.     levothyroxine (SYNTHROID) 25 MCG tablet Take 25 mcg by mouth every morning.     LINZESS 290 MCG CAPS capsule Take 290 mcg by mouth daily as needed (constipation).     Omega-3 1000 MG CAPS Take 2 capsules by mouth daily.     ondansetron (ZOFRAN) 4 MG tablet Take 1 tablet (4 mg total) by mouth every 4 (four) hours as needed for nausea. 90 tablet 3   Oxycodone HCl 20 MG TABS Take 20 mg by mouth every 8 (eight) hours as needed (pain).     pantoprazole (PROTONIX) 40 MG tablet Take 1  tablet (40 mg total) by mouth 2 (two) times daily. 60 tablet 2   pramipexole (MIRAPEX) 0.5 MG tablet Take 0.5 mg by mouth at bedtime.     predniSONE (DELTASONE) 10 MG tablet Take 5 tablets (50 mg total) by mouth daily with breakfast. 150 tablet 1    prochlorperazine (COMPAZINE) 10 MG tablet Take 1 tablet (10 mg total) by mouth every 6 (six) hours as needed for nausea or vomiting. 90 tablet 3   QUEtiapine (SEROQUEL) 200 MG tablet Take 200 mg by mouth at bedtime.     rOPINIRole (REQUIP) 0.5 MG tablet Take 0.5 mg by mouth 3 (three) times daily.     No current facility-administered medications for this visit.    I, Rita Ohara, am acting as scribe for [name], MD  I have reviewed this report as typed by the medical scribe, and it is complete and accurate.

## 2021-05-28 NOTE — Assessment & Plan Note (Signed)
Patient was up to 60 mg Prednisone daily with multiple side effects and little change in platelets. We began a slow wean of her prednisone. She is tolerating rituximab well and will proceed with cycle 3 tomorrow. Platelets remain stable at 115. We will wean prednisone from 30 mg daily to 20 mg daily. She will return to clinic in one week prior to a 4th and final cycle of rituximab.

## 2021-05-28 NOTE — Progress Notes (Signed)
Patient Care Team: Jean Nasuti, MD as PCP - General (Internal Medicine) Jean Kaplan, MD as Consulting Physician (Oncology)  Clinic Day:  05/28/2021  Referring physician: [name], MD  ASSESSMENT & PLAN:   Assessment & Plan: Chronic ITP (idiopathic thrombocytopenia) (HCC) Patient was up to 60 mg Prednisone daily with multiple side effects and little change in platelets. We began a slow wean of her prednisone. She is tolerating rituximab well and will proceed with cycle 3 tomorrow. Platelets remain stable at 115. We will wean prednisone from 30 mg daily to 20 mg daily. She will return to clinic in one week prior to a 4th and final cycle of rituximab.   The patient understands the plans discussed today and is in agreement with them.  She knows to contact our office if she develops concerns prior to her next appointment.    Jean Ped, NP  [facility] [address] [place] [address] Dept: [phone] Dept Fax: [phone]   No orders of the defined types were placed in this encounter.     CHIEF COMPLAINT:  CC: A 70 year old female with history of chronic ITP here for one week evaluation  Current Treatment:  Rituximab, weekly x 4  INTERVAL HISTORY:  Jean Kramer is here today for repeat clinical assessment. She denies fevers or chills. She denies pain. Her appetite is good. Her weight has increased 3 pounds over last week .  I have reviewed the past medical history, past surgical history, social history and family history with the patient and they are unchanged from previous note.  ALLERGIES:  has No Known Allergies.  MEDICATIONS:  Current Outpatient Medications  Medication Sig Dispense Refill   acetaminophen (TYLENOL) 500 MG tablet Take 1,000 mg by mouth every 6 (six) hours as needed for mild pain.     alendronate (FOSAMAX) 70 MG tablet Take 70 mg by mouth once a week. Friday      ALPRAZolam (XANAX) 0.5 MG tablet Take 1 tablet (0.5 mg total) by mouth 3 (three) times daily as needed for anxiety. 90 tablet 0   Budeson-Glycopyrrol-Formoterol (BREZTRI AEROSPHERE) 160-9-4.8 MCG/ACT AERO 2 puffs     budesonide-formoterol (SYMBICORT) 160-4.5 MCG/ACT inhaler Inhale 2 puffs into the lungs 2 (two) times daily as needed (shortness of breath).     busPIRone (BUSPAR) 15 MG tablet Take 15 mg by mouth 2 (two) times daily.     calcium-vitamin D (OSCAL WITH D) 500-200 MG-UNIT TABS tablet Take 1 tablet by mouth 2 (two) times daily with a meal.     furosemide (LASIX) 20 MG tablet Take 20 mg by mouth daily.     ipratropium-albuterol (DUONEB) 0.5-2.5 (3) MG/3ML SOLN Inhale 3 mLs into the lungs every 6 (six) hours as needed for shortness of breath.     levothyroxine (SYNTHROID) 25 MCG tablet Take 25 mcg by mouth every morning.     LINZESS 290 MCG CAPS capsule Take 290 mcg by mouth daily as needed (constipation).     Omega-3 1000 MG CAPS Take 2 capsules by mouth daily.     ondansetron (ZOFRAN) 4 MG tablet Take 1 tablet (4 mg total) by mouth every 4 (four) hours as needed for nausea. 90 tablet 3   Oxycodone HCl 20 MG TABS Take 20 mg by mouth every 8 (eight) hours as needed (pain).     pantoprazole (PROTONIX) 40 MG tablet Take 1 tablet (40 mg total) by mouth 2 (two)  times daily. 60 tablet 2   pramipexole (MIRAPEX) 0.5 MG tablet Take 0.5 mg by mouth at bedtime.     predniSONE (DELTASONE) 10 MG tablet Take 5 tablets (50 mg total) by mouth daily with breakfast. 150 tablet 1   prochlorperazine (COMPAZINE) 10 MG tablet Take 1 tablet (10 mg total) by mouth every 6 (six) hours as needed for nausea or vomiting. 90 tablet 3   QUEtiapine (SEROQUEL) 200 MG tablet Take 200 mg by mouth at bedtime.     rOPINIRole (REQUIP) 0.5 MG tablet Take 0.5 mg by mouth 3 (three) times daily.     No current facility-administered medications for this visit.    HISTORY OF PRESENT ILLNESS:   Oncology History   No history  exists.      REVIEW OF SYSTEMS:   Constitutional: Denies fevers, chills or abnormal weight loss Eyes: Denies blurriness of vision Ears, nose, mouth, throat, and face: Denies mucositis or sore throat Respiratory: Denies cough, dyspnea or wheezes Cardiovascular: Denies palpitation, chest discomfort or lower extremity swelling Gastrointestinal:  Denies nausea, heartburn or change in bowel habits Skin: Denies abnormal skin rashes Lymphatics: Denies new lymphadenopathy or easy bruising Neurological:Denies numbness, tingling or new weaknesses Behavioral/Psych: Mood is stable, no new changes  All other systems were reviewed with the patient and are negative.   VITALS:  Blood pressure 117/77, pulse (!) 102, temperature 98.7 F (37.1 C), temperature source Oral, resp. rate (!) 22, height 5\' 2"  (1.575 m), weight 185 lb 9.6 oz (84.2 kg), SpO2 94 %.  Wt Readings from Last 3 Encounters:  05/28/21 185 lb 9.6 oz (84.2 kg)  05/22/21 188 lb (85.3 kg)  05/21/21 182 lb 8 oz (82.8 kg)    Body mass index is 33.95 kg/m.  Performance status (ECOG): 1 - Symptomatic but completely ambulatory  PHYSICAL EXAM:   GENERAL:alert, no distress and comfortable SKIN: skin color, texture, turgor are normal, no rashes or significant lesions EYES: normal, Conjunctiva are pink and non-injected, sclera clear OROPHARYNX:no exudate, no erythema and lips, buccal mucosa, and tongue normal  NECK: supple, thyroid normal size, non-tender, without nodularity LYMPH:  no palpable lymphadenopathy in the cervical, axillary or inguinal LUNGS: clear to auscultation and percussion with normal breathing effort HEART: regular rate & rhythm and no murmurs and no lower extremity edema ABDOMEN:abdomen soft, non-tender and normal bowel sounds Musculoskeletal:no cyanosis of digits and no clubbing  NEURO: alert & oriented x 3 with fluent speech, no focal motor/sensory deficits  LABORATORY DATA:  I have reviewed the data as listed     Component Value Date/Time   NA 141 05/21/2021 0000   K 4.0 05/21/2021 0000   CL 102 05/21/2021 0000   CO2 30 (A) 05/21/2021 0000   GLUCOSE 203 (H) 03/06/2021 0350   BUN 34 (A) 05/21/2021 0000   CREATININE 0.9 05/21/2021 0000   CREATININE 0.95 03/06/2021 0350   CALCIUM 9.9 05/21/2021 0000   PROT 6.8 03/06/2021 0350   ALBUMIN 4.1 05/21/2021 0000   AST 75 (A) 05/21/2021 0000   ALT 85 (A) 05/21/2021 0000   ALKPHOS 98 05/21/2021 0000   BILITOT 0.6 03/06/2021 0350   GFRNONAA >60 03/06/2021 0350    No results found for: SPEP, UPEP  Lab Results  Component Value Date   WBC 14.3 05/21/2021   NEUTROABS 12.87 05/21/2021   HGB 14.5 05/21/2021   HCT 44 05/21/2021   MCV 102 (A) 05/09/2021   PLT 110 (A) 05/21/2021      Chemistry  Component Value Date/Time   NA 141 05/21/2021 0000   K 4.0 05/21/2021 0000   CL 102 05/21/2021 0000   CO2 30 (A) 05/21/2021 0000   BUN 34 (A) 05/21/2021 0000   CREATININE 0.9 05/21/2021 0000   CREATININE 0.95 03/06/2021 0350   GLU 110 05/21/2021 0000      Component Value Date/Time   CALCIUM 9.9 05/21/2021 0000   ALKPHOS 98 05/21/2021 0000   AST 75 (A) 05/21/2021 0000   ALT 85 (A) 05/21/2021 0000   BILITOT 0.6 03/06/2021 0350       RADIOGRAPHIC STUDIES: I have personally reviewed the radiological images as listed and agreed with the findings in the report. No results found.

## 2021-05-29 ENCOUNTER — Other Ambulatory Visit: Payer: Self-pay

## 2021-05-29 ENCOUNTER — Inpatient Hospital Stay: Payer: Medicare Other

## 2021-05-29 VITALS — BP 97/65 | HR 93 | Temp 98.0°F | Resp 18 | Ht 62.0 in | Wt 188.0 lb

## 2021-05-29 DIAGNOSIS — Z5111 Encounter for antineoplastic chemotherapy: Secondary | ICD-10-CM | POA: Diagnosis not present

## 2021-05-29 DIAGNOSIS — D693 Immune thrombocytopenic purpura: Secondary | ICD-10-CM

## 2021-05-29 MED ORDER — ACETAMINOPHEN 325 MG PO TABS
650.0000 mg | ORAL_TABLET | Freq: Once | ORAL | Status: AC
  Administered 2021-05-29: 650 mg via ORAL
  Filled 2021-05-29: qty 2

## 2021-05-29 MED ORDER — DIPHENHYDRAMINE HCL 25 MG PO CAPS
50.0000 mg | ORAL_CAPSULE | Freq: Once | ORAL | Status: AC
  Administered 2021-05-29: 50 mg via ORAL
  Filled 2021-05-29: qty 2

## 2021-05-29 MED ORDER — SODIUM CHLORIDE 0.9 % IV SOLN
375.0000 mg/m2 | Freq: Once | INTRAVENOUS | Status: AC
  Administered 2021-05-29: 700 mg via INTRAVENOUS
  Filled 2021-05-29: qty 50

## 2021-05-29 MED ORDER — SODIUM CHLORIDE 0.9 % IV SOLN: Freq: Once | INTRAVENOUS | Status: AC

## 2021-05-29 MED ORDER — HEPARIN SOD (PORK) LOCK FLUSH 100 UNIT/ML IV SOLN: 500.0000 [IU] | Freq: Once | INTRAVENOUS | Status: DC | PRN

## 2021-05-29 MED ORDER — SODIUM CHLORIDE 0.9% FLUSH: 10.0000 mL | INTRAVENOUS | Status: DC | PRN

## 2021-05-29 NOTE — Progress Notes (Signed)
0905 discussed patients shortness of breath and bp with Marlon Pel. Ok to proceed with treatment, pt is resting calm and states this is how she is normally.

## 2021-05-29 NOTE — Patient Instructions (Signed)
[facility]  Discharge Instructions: Thank you for choosing [facility] to provide your oncology and hematology care.  If you have a lab appointment with the [facility], please go directly to the [facility] and check in at the registration area.   Wear comfortable clothing and clothing appropriate for easy access to any Portacath or PICC line.   We strive to give you quality time with your provider. You may need to reschedule your appointment if you arrive late (15 or more minutes).  Arriving late affects you and other patients whose appointments are after yours.  Also, if you miss three or more appointments without notifying the office, you may be dismissed from the clinic at the provider's discretion.      For prescription refill requests, have your pharmacy contact our office and allow 72 hours for refills to be completed.    Today you received the following chemotherapy and/or immunotherapy agents rituximab   To help prevent nausea and vomiting after your treatment, we encourage you to take your nausea medication as directed.  BELOW ARE SYMPTOMS THAT SHOULD BE REPORTED IMMEDIATELY: *FEVER GREATER THAN 100.4 F (38 C) OR HIGHER *CHILLS OR SWEATING *NAUSEA AND VOMITING THAT IS NOT CONTROLLED WITH YOUR NAUSEA MEDICATION *UNUSUAL SHORTNESS OF BREATH *UNUSUAL BRUISING OR BLEEDING *URINARY PROBLEMS (pain or burning when urinating, or frequent urination) *BOWEL PROBLEMS (unusual diarrhea, constipation, pain near the anus) TENDERNESS IN MOUTH AND THROAT WITH OR WITHOUT PRESENCE OF ULCERS (sore throat, sores in mouth, or a toothache) UNUSUAL RASH, SWELLING OR PAIN  UNUSUAL VAGINAL DISCHARGE OR ITCHING   Items with * indicate a potential emergency and should be followed up as soon as possible or go to the Emergency Department if any problems should occur.  Please show the CHEMOTHERAPY ALERT CARD or IMMUNOTHERAPY ALERT CARD at check-in to the  Emergency Department and triage nurse.  Should you have questions after your visit or need to cancel or reschedule your appointment, please contact [facility]  Dept: [phone]  and follow the prompts.  Office hours are 8:00 a.m. to 4:30 p.m. Monday - Friday. Please note that voicemails left after 4:00 p.m. may not be returned until the following business day.  We are closed weekends and major holidays. You have access to a nurse at all times for urgent questions. Please call the main number to the clinic Dept: [phone] and follow the prompts.  For any non-urgent questions, you may also contact your provider using MyChart. We now offer e-Visits for anyone 50 and older to request care online for non-urgent symptoms. For details visit [email or web address].   Also download the MyChart app! Go to the app store, search "MyChart", open the app, select [facility], and log in with your MyChart username and password.  Due to Covid, a mask is required upon entering the hospital/clinic. If you do not have a mask, one will be given to you upon arrival. For doctor visits, patients may have 1 support person aged 60 or older with them. For treatment visits, patients cannot have anyone with them due to current Covid guidelines and our immunocompromised population.   Rituximab; Hyaluronidase injection What is this medication? RITUXIMAB; HYALURONIDASE (ri TUX i mab / hye al ur ON i dase) is used to treat non-Hodgkin lymphoma and chronic lymphocytic leukemia. Rituximab is a monoclonal antibody. Hyaluronidase is used to improve the effects of rituximab. This medicine may be used for other purposes; ask your  health care provider or pharmacist if you have questions. COMMON BRAND NAME(S): Rituxan Hycela What should I tell my care team before I take this medication? They need to know if you have any of these conditions: heart disease infection (especially a virus infection such as  hepatitis B, chickenpox, cold sores, or herpes) immune system problems irregular heartbeat kidney disease lung or breathing disease, like asthma recently received or scheduled to receive a vaccine an unusual or allergic reaction to rituximab, rituximab/hyaluronidase, mouse proteins, other medicines, foods, dyes, or preservatives pregnant or trying to get pregnant breast-feeding How should I use this medication? This medicine is for injection under the skin. It is given by a health care professional in a hospital or clinic setting. A special MedGuide will be given to you before each treatment. Be sure to read this information carefully each time. Talk to your pediatrician regarding the use of this medicine in children. Special care may be needed. Overdosage: If you think you have taken too much of this medicine contact a poison control center or emergency room at once. NOTE: This medicine is only for you. Do not share this medicine with others. What if I miss a dose? It is important not to miss your dose. Call your doctor or health care professional if you are unable to keep an appointment. What may interact with this medication? This medicine may interact with the following medications: cisplatin live virus vaccines This list may not describe all possible interactions. Give your health care provider a list of all the medicines, herbs, non-prescription drugs, or dietary supplements you use. Also tell them if you smoke, drink alcohol, or use illegal drugs. Some items may interact with your medicine. What should I watch for while using this medication? Your condition will be monitored carefully while you are receiving this medicine. You may need blood work done while you are taking this medicine. This medicine can cause serious allergic reactions. To reduce your risk you may need to take medicine before treatment with this medicine. Take your medicine as directed. In some patients, this  medicine may cause a serious brain infection that may cause death. If you have any problems seeing, thinking, speaking, walking, or standing, tell your doctor right away. If you cannot reach your doctor, urgently seek other source of medical care. Call your doctor or health care professional for advice if you get a fever, chills or sore throat, or other symptoms of a cold or flu. Do not treat yourself. This drug decreases your body's ability to fight infections. Try to avoid being around people who are sick. Do not become pregnant while taking this medicine or for 12 months after stopping it. Women should inform their doctor if they wish to become pregnant or think they might be pregnant. There is a potential for serious side effects to an unborn child. Talk to your health care professional or pharmacist for more information. Do not breast-feed an infant while taking this medicine or for at least 6 months after stopping it. What side effects may I notice from receiving this medication? Side effects that you should report to your doctor or health care professional as soon as possible: allergic reactions like skin rash, itching or hives; swelling of the face, lips, or tongue breathing problems chest pain changes in vision diarrhea dizziness headache with fever, neck stiffness, sensitivity to light, nausea, or confusion fast, irregular heartbeat loss of memory low blood counts - this medicine may decrease the number of white blood cells,  red blood cells and platelets. You may be at increased risk for infections and bleeding. mouth sores problems with balance, talking, or walking redness, blistering, peeling or loosening of the skin, including inside the mouth signs of infection - fever or chills, cough, sore throat, pain or difficulty passing urine signs and symptoms of kidney injury like trouble passing urine or change in the amount of urine signs and symptoms of liver injury like dark yellow or  brown urine; general ill feeling or flu-like symptoms; light-colored stools; loss of appetite; nausea; right upper belly pain; unusually weak or tired; yellowing of the eyes or skin stomach pain swelling of the ankles, feet, hands unusual bleeding or bruising vomiting Side effects that usually do not require medical attention (report these to your doctor or health care professional if they continue or are bothersome): headache joint pain muscle cramps or muscle pain nausea pain, redness, or irritation at site where injected tiredness This list may not describe all possible side effects. Call your doctor for medical advice about side effects. You may report side effects to FDA at 1-800-FDA-1088. Where should I keep my medication? This drug is given in a hospital or clinic and will not be stored at home. NOTE: This sheet is a summary. It may not cover all possible information. If you have questions about this medicine, talk to your doctor, pharmacist, or health care provider.  2022 [employer] Standard (2017-06-18 00:00:00)

## 2021-06-04 ENCOUNTER — Other Ambulatory Visit: Payer: Self-pay | Admitting: Oncology

## 2021-06-04 ENCOUNTER — Telehealth: Payer: Self-pay | Admitting: Oncology

## 2021-06-04 ENCOUNTER — Inpatient Hospital Stay: Payer: Medicare Other

## 2021-06-04 ENCOUNTER — Inpatient Hospital Stay (INDEPENDENT_AMBULATORY_CARE_PROVIDER_SITE_OTHER): Payer: Medicare Other | Admitting: Oncology

## 2021-06-04 ENCOUNTER — Encounter: Payer: Self-pay | Admitting: Oncology

## 2021-06-04 VITALS — BP 101/59 | HR 98 | Temp 98.1°F | Resp 18 | Ht 62.0 in | Wt 187.0 lb

## 2021-06-04 DIAGNOSIS — D693 Immune thrombocytopenic purpura: Secondary | ICD-10-CM

## 2021-06-04 LAB — CBC AND DIFFERENTIAL
HCT: 42 (ref 36–46)
Hemoglobin: 14.4 (ref 12.0–16.0)
Neutrophils Absolute: 14.42
Platelets: 142 — AB (ref 150–399)
WBC: 16.2

## 2021-06-04 LAB — BASIC METABOLIC PANEL
BUN: 28 — AB (ref 4–21)
CO2: 30 — AB (ref 13–22)
Chloride: 100 (ref 99–108)
Creatinine: 0.8 (ref 0.5–1.1)
Glucose: 141
Potassium: 4.6 (ref 3.4–5.3)
Sodium: 140 (ref 137–147)

## 2021-06-04 LAB — HEPATIC FUNCTION PANEL
ALT: 144 — AB (ref 7–35)
AST: 137 — AB (ref 13–35)
Alkaline Phosphatase: 116 (ref 25–125)
Bilirubin, Total: 0.4

## 2021-06-04 LAB — COMPREHENSIVE METABOLIC PANEL
Albumin: 4.2 (ref 3.5–5.0)
Calcium: 10 (ref 8.7–10.7)

## 2021-06-04 LAB — CBC: RBC: 4.19 (ref 3.87–5.11)

## 2021-06-04 NOTE — Telephone Encounter (Signed)
Per 11/16 los next appt scheduled and given to patient 

## 2021-06-05 ENCOUNTER — Other Ambulatory Visit: Payer: Self-pay | Admitting: Pharmacist

## 2021-06-05 ENCOUNTER — Inpatient Hospital Stay: Payer: Medicare Other

## 2021-06-05 ENCOUNTER — Other Ambulatory Visit: Payer: Self-pay

## 2021-06-05 VITALS — BP 109/83 | HR 84 | Temp 98.1°F | Resp 18 | Ht 62.0 in | Wt 190.1 lb

## 2021-06-05 DIAGNOSIS — D693 Immune thrombocytopenic purpura: Secondary | ICD-10-CM

## 2021-06-05 DIAGNOSIS — Z5111 Encounter for antineoplastic chemotherapy: Secondary | ICD-10-CM | POA: Diagnosis not present

## 2021-06-05 MED ORDER — SODIUM CHLORIDE 0.9 % IV SOLN
375.0000 mg/m2 | Freq: Once | INTRAVENOUS | Status: AC
  Administered 2021-06-05: 700 mg via INTRAVENOUS
  Filled 2021-06-05: qty 50

## 2021-06-05 MED ORDER — SODIUM CHLORIDE 0.9 % IV SOLN: Freq: Once | INTRAVENOUS | Status: AC

## 2021-06-05 MED ORDER — SODIUM CHLORIDE 0.9% FLUSH: 10.0000 mL | INTRAVENOUS | Status: DC | PRN

## 2021-06-05 MED ORDER — ACETAMINOPHEN 325 MG PO TABS
650.0000 mg | ORAL_TABLET | Freq: Once | ORAL | Status: AC
  Administered 2021-06-05: 650 mg via ORAL
  Filled 2021-06-05: qty 2

## 2021-06-05 MED ORDER — DIPHENHYDRAMINE HCL 25 MG PO CAPS
50.0000 mg | ORAL_CAPSULE | Freq: Once | ORAL | Status: AC
  Administered 2021-06-05: 50 mg via ORAL
  Filled 2021-06-05: qty 2

## 2021-06-05 MED ORDER — HEPARIN SOD (PORK) LOCK FLUSH 100 UNIT/ML IV SOLN: 500.0000 [IU] | Freq: Once | INTRAVENOUS | Status: DC | PRN

## 2021-06-05 NOTE — Progress Notes (Signed)
Patient is here for rituximab and is hypotensive. She continues furosemide 20 mg daily. Premeds and treatment today can lower her BP, so will give a fluid bolus and continue to monitor.

## 2021-06-05 NOTE — Patient Instructions (Signed)
Jean Kramer  Discharge Instructions: Thank you for choosing [facility] to provide your oncology and hematology care.  If you have a lab appointment with the [facility], please go directly to the [facility] and check in at the registration area.   Wear comfortable clothing and clothing appropriate for easy access to any Portacath or PICC line.   We strive to give you quality time with your provider. You may need to reschedule your appointment if you arrive late (15 or more minutes).  Arriving late affects you and other patients whose appointments are after yours.  Also, if you miss three or more appointments without notifying the office, you may be dismissed from the clinic at the provider's discretion.      For prescription refill requests, have your pharmacy contact our office and allow 72 hours for refills to be completed.    Today you received the following chemotherapy and/or immunotherapy agents rituximab     To help prevent nausea and vomiting after your treatment, we encourage you to take your nausea medication as directed.  BELOW ARE SYMPTOMS THAT SHOULD BE REPORTED IMMEDIATELY: *FEVER GREATER THAN 100.4 F (38 C) OR HIGHER *CHILLS OR SWEATING *NAUSEA AND VOMITING THAT IS NOT CONTROLLED WITH YOUR NAUSEA MEDICATION *UNUSUAL SHORTNESS OF BREATH *UNUSUAL BRUISING OR BLEEDING *URINARY PROBLEMS (pain or burning when urinating, or frequent urination) *BOWEL PROBLEMS (unusual diarrhea, constipation, pain near the anus) TENDERNESS IN MOUTH AND THROAT WITH OR WITHOUT PRESENCE OF ULCERS (sore throat, sores in mouth, or a toothache) UNUSUAL RASH, SWELLING OR PAIN  UNUSUAL VAGINAL DISCHARGE OR ITCHING   Items with * indicate a potential emergency and should be followed up as soon as possible or go to the Emergency Department if any problems should occur.  Please show the CHEMOTHERAPY ALERT CARD or IMMUNOTHERAPY ALERT CARD at check-in to the  Emergency Department and triage nurse.  Should you have questions after your visit or need to cancel or reschedule your appointment, please contact [facility]  Dept: [phone]  and follow the prompts.  Office hours are 8:00 a.m. to 4:30 p.m. Monday - Friday. Please note that voicemails left after 4:00 p.m. may not be returned until the following business day.  We are closed weekends and major holidays. You have access to a nurse at all times for urgent questions. Please call the main number to the clinic Dept: [phone] and follow the prompts.  For any non-urgent questions, you may also contact your provider using MyChart. We now offer e-Visits for anyone 71 and older to request care online for non-urgent symptoms. For details visit [email or web address].   Also download the MyChart app! Go to the app store, search "MyChart", open the app, select [facility], and log in with your MyChart username and password.  Due to Covid, a mask is required upon entering the hospital/clinic. If you do not have a mask, one will be given to you upon arrival. For doctor visits, patients may have 1 support person aged 49 or older with them. For treatment visits, patients cannot have anyone with them due to current Covid guidelines and our immunocompromised population.   Rituximab; Hyaluronidase injection What is this medication? RITUXIMAB; HYALURONIDASE (ri TUX i mab / hye al ur ON i dase) is used to treat non-Hodgkin lymphoma and chronic lymphocytic leukemia. Rituximab is a monoclonal antibody. Hyaluronidase is used to improve the effects of rituximab. This medicine may be used for other purposes;  ask your health care provider or pharmacist if you have questions. COMMON BRAND NAME(S): Rituxan Hycela What should I tell my care team before I take this medication? They need to know if you have any of these conditions: heart disease infection (especially a virus infection such as  hepatitis B, chickenpox, cold sores, or herpes) immune system problems irregular heartbeat kidney disease lung or breathing disease, like asthma recently received or scheduled to receive a vaccine an unusual or allergic reaction to rituximab, rituximab/hyaluronidase, mouse proteins, other medicines, foods, dyes, or preservatives pregnant or trying to get pregnant breast-feeding How should I use this medication? This medicine is for injection under the skin. It is given by a health care professional in a hospital or clinic setting. A special MedGuide will be given to you before each treatment. Be sure to read this information carefully each time. Talk to your pediatrician regarding the use of this medicine in children. Special care may be needed. Overdosage: If you think you have taken too much of this medicine contact a poison control center or emergency room at once. NOTE: This medicine is only for you. Do not share this medicine with others. What if I miss a dose? It is important not to miss your dose. Call your doctor or health care professional if you are unable to keep an appointment. What may interact with this medication? This medicine may interact with the following medications: cisplatin live virus vaccines This list may not describe all possible interactions. Give your health care provider a list of all the medicines, herbs, non-prescription drugs, or dietary supplements you use. Also tell them if you smoke, drink alcohol, or use illegal drugs. Some items may interact with your medicine. What should I watch for while using this medication? Your condition will be monitored carefully while you are receiving this medicine. You may need blood work done while you are taking this medicine. This medicine can cause serious allergic reactions. To reduce your risk you may need to take medicine before treatment with this medicine. Take your medicine as directed. In some patients, this  medicine may cause a serious brain infection that may cause death. If you have any problems seeing, thinking, speaking, walking, or standing, tell your doctor right away. If you cannot reach your doctor, urgently seek other source of medical care. Call your doctor or health care professional for advice if you get a fever, chills or sore throat, or other symptoms of a cold or flu. Do not treat yourself. This drug decreases your body's ability to fight infections. Try to avoid being around people who are sick. Do not become pregnant while taking this medicine or for 12 months after stopping it. Women should inform their doctor if they wish to become pregnant or think they might be pregnant. There is a potential for serious side effects to an unborn child. Talk to your health care professional or pharmacist for more information. Do not breast-feed an infant while taking this medicine or for at least 6 months after stopping it. What side effects may I notice from receiving this medication? Side effects that you should report to your doctor or health care professional as soon as possible: allergic reactions like skin rash, itching or hives; swelling of the face, lips, or tongue breathing problems chest pain changes in vision diarrhea dizziness headache with fever, neck stiffness, sensitivity to light, nausea, or confusion fast, irregular heartbeat loss of memory low blood counts - this medicine may decrease the number of white  blood cells, red blood cells and platelets. You may be at increased risk for infections and bleeding. mouth sores problems with balance, talking, or walking redness, blistering, peeling or loosening of the skin, including inside the mouth signs of infection - fever or chills, cough, sore throat, pain or difficulty passing urine signs and symptoms of kidney injury like trouble passing urine or change in the amount of urine signs and symptoms of liver injury like dark yellow or  brown urine; general ill feeling or flu-like symptoms; light-colored stools; loss of appetite; nausea; right upper belly pain; unusually weak or tired; yellowing of the eyes or skin stomach pain swelling of the ankles, feet, hands unusual bleeding or bruising vomiting Side effects that usually do not require medical attention (report these to your doctor or health care professional if they continue or are bothersome): headache joint pain muscle cramps or muscle pain nausea pain, redness, or irritation at site where injected tiredness This list may not describe all possible side effects. Call your doctor for medical advice about side effects. You may report side effects to FDA at 1-800-FDA-1088. Where should I keep my medication? This drug is given in a hospital or clinic and will not be stored at home. NOTE: This sheet is a summary. It may not cover all possible information. If you have questions about this medicine, talk to your doctor, pharmacist, or health care provider.  2022 Elsevier/Gold Standard (2017-06-18 00:00:00)

## 2021-06-10 ENCOUNTER — Encounter: Payer: Self-pay | Admitting: Oncology

## 2021-06-11 ENCOUNTER — Inpatient Hospital Stay: Payer: Medicare Other

## 2021-06-11 DIAGNOSIS — D693 Immune thrombocytopenic purpura: Secondary | ICD-10-CM

## 2021-06-11 LAB — CBC AND DIFFERENTIAL
HCT: 44 (ref 36–46)
Hemoglobin: 14.4 (ref 12.0–16.0)
Neutrophils Absolute: 11.2
Platelets: 168 (ref 150–399)
WBC: 14

## 2021-06-11 LAB — CBC: RBC: 4.34 (ref 3.87–5.11)

## 2021-06-13 ENCOUNTER — Other Ambulatory Visit: Payer: Self-pay | Admitting: Hematology and Oncology

## 2021-06-13 DIAGNOSIS — D693 Immune thrombocytopenic purpura: Secondary | ICD-10-CM

## 2021-06-16 ENCOUNTER — Other Ambulatory Visit: Payer: Self-pay | Admitting: Hematology and Oncology

## 2021-06-16 ENCOUNTER — Encounter: Payer: Self-pay | Admitting: Oncology

## 2021-06-16 LAB — CBC: MCV: 101 — AB (ref 80–99)

## 2021-06-18 ENCOUNTER — Telehealth: Payer: Self-pay | Admitting: Hematology and Oncology

## 2021-06-18 ENCOUNTER — Inpatient Hospital Stay (INDEPENDENT_AMBULATORY_CARE_PROVIDER_SITE_OTHER): Payer: Medicare Other | Admitting: Hematology and Oncology

## 2021-06-18 ENCOUNTER — Inpatient Hospital Stay: Payer: Medicare Other

## 2021-06-18 ENCOUNTER — Other Ambulatory Visit: Payer: Self-pay

## 2021-06-18 ENCOUNTER — Encounter: Payer: Self-pay | Admitting: Hematology and Oncology

## 2021-06-18 DIAGNOSIS — D693 Immune thrombocytopenic purpura: Secondary | ICD-10-CM

## 2021-06-18 LAB — BASIC METABOLIC PANEL
BUN: 26 — AB (ref 4–21)
CO2: 23 — AB (ref 13–22)
Chloride: 101 (ref 99–108)
Creatinine: 1.4 — AB (ref 0.5–1.1)
Glucose: 188
Potassium: 4.5 (ref 3.4–5.3)
Sodium: 136 — AB (ref 137–147)

## 2021-06-18 LAB — COMPREHENSIVE METABOLIC PANEL
Albumin: 4.1 (ref 3.5–5.0)
Calcium: 10.3 (ref 8.7–10.7)

## 2021-06-18 LAB — CBC AND DIFFERENTIAL
HCT: 44 (ref 36–46)
Hemoglobin: 14.7 (ref 12.0–16.0)
Neutrophils Absolute: 11.78
Platelets: 151 (ref 150–399)
WBC: 15.1

## 2021-06-18 LAB — HEPATIC FUNCTION PANEL
ALT: 121 — AB (ref 7–35)
AST: 109 — AB (ref 13–35)
Alkaline Phosphatase: 90 (ref 25–125)
Bilirubin, Total: 0.6

## 2021-06-18 LAB — CBC: RBC: 4.32 (ref 3.87–5.11)

## 2021-06-18 NOTE — Telephone Encounter (Signed)
Per 11/30 LOS, patient scheduled for Dec Appt's.  Gave patient Appt Summary

## 2021-06-18 NOTE — Assessment & Plan Note (Addendum)
She has completed her 4 rounds of Rituximab and tolerated well. Prednisone has been weaned from 60 mg to 5 mg and platelets today are 151. She has multiple side effects with the prednisone.  We will have her stop her prednisone and return in 2 weeks for evaluation.

## 2021-06-18 NOTE — Progress Notes (Signed)
Patient Care Team: [name], MD as PCP - General (Internal Medicine) [name], MD as Consulting Physician (Oncology)  Clinic Day:  06/18/2021  Referring physician: [name], MD  ASSESSMENT & PLAN:   Assessment & Plan: Chronic ITP (idiopathic thrombocytopenia) (HCC) She has completed her 4 rounds of Rituximab and tolerated well. Prednisone has been weaned from 60 mg to 5 mg and platelets today are 151. She has multiple side effects with the prednisone.  We will have her stop her prednisone and return in 2 weeks for evaluation.    The patient understands the plans discussed today and is in agreement with them.  She knows to contact our office if she develops concerns prior to her next appointment.     [name], NP  [facility] [address] [place] [address] Dept: [phone] Dept Fax: [phone]   Orders Placed This Encounter  Procedures   CBC and differential    This external order was created through the Results Console.   CBC    This external order was created through the Results Console.   Basic metabolic panel    This external order was created through the Results Console.   Basic metabolic panel    This external order was created through the Results Console.   Comprehensive metabolic panel    This external order was created through the Results Console.   Hepatic function panel    This external order was created through the Results Console.      CHIEF COMPLAINT:  CC: A 70 year old female with history of chronic ITP here for 1 week evaluation  Current Treatment:  Prednisone  INTERVAL HISTORY:  Jean Kramer is here today for repeat clinical assessment. She denies fevers or chills. She denies pain. Her appetite is good. Her weight has been stable.  I have reviewed the past medical history, past surgical history, social history and family history with the patient and  they are unchanged from previous note.  ALLERGIES:  has No Known Allergies.  MEDICATIONS:  Current Outpatient Medications  Medication Sig Dispense Refill   acetaminophen (TYLENOL) 500 MG tablet Take 1,000 mg by mouth every 6 (six) hours as needed for mild pain.     alendronate (FOSAMAX) 70 MG tablet Take 70 mg by mouth once a week. Friday     ALPRAZolam (XANAX) 0.5 MG tablet Take 1 tablet (0.5 mg total) by mouth 3 (three) times daily as needed for anxiety. 90 tablet 0   Budeson-Glycopyrrol-Formoterol (BREZTRI AEROSPHERE) 160-9-4.8 MCG/ACT AERO 2 puffs     budesonide-formoterol (SYMBICORT) 160-4.5 MCG/ACT inhaler Inhale 2 puffs into the lungs 2 (two) times daily as needed (shortness of breath).     busPIRone (BUSPAR) 15 MG tablet Take 15 mg by mouth 2 (two) times daily.     calcium-vitamin D (OSCAL WITH D) 500-200 MG-UNIT TABS tablet Take 1 tablet by mouth 2 (two) times daily with a meal.     furosemide (LASIX) 20 MG tablet Take 20 mg by mouth daily.     ipratropium-albuterol (DUONEB) 0.5-2.5 (3) MG/3ML SOLN Inhale 3 mLs into the lungs every 6 (six) hours as needed for shortness of breath.     levothyroxine (SYNTHROID) 25 MCG tablet Take 25 mcg by mouth every morning.     LINZESS 290 MCG CAPS capsule Take 290 mcg by mouth daily as needed (constipation).     Omega-3 1000 MG CAPS Take 2 capsules by  mouth daily.     ondansetron (ZOFRAN) 4 MG tablet Take 1 tablet (4 mg total) by mouth every 4 (four) hours as needed for nausea. 90 tablet 3   Oxycodone HCl 20 MG TABS Take 20 mg by mouth every 8 (eight) hours as needed (pain).     pantoprazole (PROTONIX) 40 MG tablet Take 1 tablet (40 mg total) by mouth 2 (two) times daily. 60 tablet 2   pramipexole (MIRAPEX) 0.5 MG tablet Take 0.5 mg by mouth at bedtime.     predniSONE (DELTASONE) 10 MG tablet Take 1 tablet (10 mg total) by mouth daily with breakfast. 30 tablet 0   prochlorperazine (COMPAZINE) 10 MG tablet Take 1 tablet (10 mg total) by mouth every  6 (six) hours as needed for nausea or vomiting. 90 tablet 3   QUEtiapine (SEROQUEL) 200 MG tablet Take 200 mg by mouth at bedtime.     rOPINIRole (REQUIP) 0.5 MG tablet Take 0.5 mg by mouth 3 (three) times daily.     No current facility-administered medications for this visit.    HISTORY OF PRESENT ILLNESS:   Oncology History   No history exists.      REVIEW OF SYSTEMS:   Constitutional: Denies fevers, chills or abnormal weight loss Eyes: Denies blurriness of vision Ears, nose, mouth, throat, and face: Denies mucositis or sore throat Respiratory: Denies cough, dyspnea or wheezes Cardiovascular: Denies palpitation, chest discomfort or lower extremity swelling Gastrointestinal:  Denies nausea, heartburn or change in bowel habits Skin: Denies abnormal skin rashes Lymphatics: Denies new lymphadenopathy or easy bruising Neurological:Denies numbness, tingling or new weaknesses Behavioral/Psych: Mood is stable, no new changes  All other systems were reviewed with the patient and are negative.   VITALS:  Blood pressure 92/62, pulse 90, temperature 97.9 F (36.6 C), temperature source Oral, resp. rate 18, height 5\' 2"  (1.575 m), weight 190 lb (86.2 kg), SpO2 94 %.  Wt Readings from Last 3 Encounters:  06/18/21 190 lb (86.2 kg)  06/05/21 190 lb 1.3 oz (86.2 kg)  06/04/21 187 lb (84.8 kg)    Body mass index is 34.75 kg/m.  Performance status (ECOG): 1 - Symptomatic but completely ambulatory  PHYSICAL EXAM:   GENERAL:alert, no distress and comfortable SKIN: skin color, texture, turgor are normal, no rashes or significant lesions EYES: normal, Conjunctiva are pink and non-injected, sclera clear OROPHARYNX:no exudate, no erythema and lips, buccal mucosa, and tongue normal  NECK: supple, thyroid normal size, non-tender, without nodularity LYMPH:  no palpable lymphadenopathy in the cervical, axillary or inguinal LUNGS: clear to auscultation and percussion with normal breathing  effort HEART: regular rate & rhythm and no murmurs and no lower extremity edema ABDOMEN:abdomen soft, non-tender and normal bowel sounds Musculoskeletal:no cyanosis of digits and no clubbing  NEURO: alert & oriented x 3 with fluent speech, no focal motor/sensory deficits  LABORATORY DATA:  I have reviewed the data as listed    Component Value Date/Time   NA 136 (A) 06/18/2021 0000   K 4.5 06/18/2021 0000   CL 101 06/18/2021 0000   CO2 23 (A) 06/18/2021 0000   GLUCOSE 203 (H) 03/06/2021 0350   BUN 26 (A) 06/18/2021 0000   CREATININE 1.4 (A) 06/18/2021 0000   CREATININE 0.95 03/06/2021 0350   CALCIUM 10.3 06/18/2021 0000   PROT 6.8 03/06/2021 0350   ALBUMIN 4.1 06/18/2021 0000   AST 109 (A) 06/18/2021 0000   ALT 121 (A) 06/18/2021 0000   ALKPHOS 90 06/18/2021 0000   BILITOT 0.6 03/06/2021  [place] >60 03/06/2021 0350    No results found for: SPEP, UPEP  Lab Results  Component Value Date   WBC 15.1 06/18/2021   NEUTROABS 11.78 06/18/2021   HGB 14.7 06/18/2021   HCT 44 06/18/2021   MCV 101 (A) 06/16/2021   PLT 151 06/18/2021      Chemistry      Component Value Date/Time   NA 136 (A) 06/18/2021 0000   K 4.5 06/18/2021 0000   CL 101 06/18/2021 0000   CO2 23 (A) 06/18/2021 0000   BUN 26 (A) 06/18/2021 0000   CREATININE 1.4 (A) 06/18/2021 0000   CREATININE 0.95 03/06/2021 0350   GLU 188 06/18/2021 0000      Component Value Date/Time   CALCIUM 10.3 06/18/2021 0000   ALKPHOS 90 06/18/2021 0000   AST 109 (A) 06/18/2021 0000   ALT 121 (A) 06/18/2021 0000   BILITOT 0.6 03/06/2021 0350       RADIOGRAPHIC STUDIES: I have personally reviewed the radiological images as listed and agreed with the findings in the report. No results found.

## 2021-07-01 ENCOUNTER — Ambulatory Visit: Payer: 59 | Admitting: Hematology and Oncology

## 2021-07-01 ENCOUNTER — Inpatient Hospital Stay: Payer: Medicare Other | Attending: Oncology

## 2021-07-01 ENCOUNTER — Telehealth: Payer: Self-pay | Admitting: Hematology and Oncology

## 2021-07-01 ENCOUNTER — Encounter: Payer: Self-pay | Admitting: Hematology and Oncology

## 2021-07-01 ENCOUNTER — Inpatient Hospital Stay (INDEPENDENT_AMBULATORY_CARE_PROVIDER_SITE_OTHER): Payer: Medicare Other | Admitting: Hematology and Oncology

## 2021-07-01 ENCOUNTER — Other Ambulatory Visit: Payer: Self-pay | Admitting: Hematology and Oncology

## 2021-07-01 ENCOUNTER — Telehealth: Payer: Self-pay

## 2021-07-01 VITALS — BP 92/61 | HR 101 | Temp 98.0°F | Resp 22 | Ht 62.0 in | Wt 189.4 lb

## 2021-07-01 DIAGNOSIS — R310 Gross hematuria: Secondary | ICD-10-CM | POA: Insufficient documentation

## 2021-07-01 DIAGNOSIS — D693 Immune thrombocytopenic purpura: Secondary | ICD-10-CM

## 2021-07-01 DIAGNOSIS — K068 Other specified disorders of gingiva and edentulous alveolar ridge: Secondary | ICD-10-CM

## 2021-07-01 DIAGNOSIS — R0600 Dyspnea, unspecified: Secondary | ICD-10-CM | POA: Insufficient documentation

## 2021-07-01 DIAGNOSIS — R0602 Shortness of breath: Secondary | ICD-10-CM | POA: Diagnosis not present

## 2021-07-01 LAB — COMPREHENSIVE METABOLIC PANEL
Albumin: 4 (ref 3.5–5.0)
Calcium: 10 (ref 8.7–10.7)

## 2021-07-01 LAB — CBC: RBC: 4.37 (ref 3.87–5.11)

## 2021-07-01 LAB — BASIC METABOLIC PANEL
BUN: 17 (ref 4–21)
CO2: 24 — AB (ref 13–22)
Chloride: 105 (ref 99–108)
Creatinine: 0.9 (ref 0.5–1.1)
Glucose: 124
Potassium: 4.2 (ref 3.4–5.3)
Sodium: 139 (ref 137–147)

## 2021-07-01 LAB — HEPATIC FUNCTION PANEL
ALT: 103 — AB (ref 7–35)
AST: 113 — AB (ref 13–35)
Alkaline Phosphatase: 82 (ref 25–125)
Bilirubin, Total: 0.5

## 2021-07-01 LAB — CBC AND DIFFERENTIAL
HCT: 45 (ref 36–46)
Hemoglobin: 15 (ref 12.0–16.0)
Neutrophils Absolute: 5.67
Platelets: 164 (ref 150–399)
WBC: 9.3

## 2021-07-01 MED ORDER — CIPROFLOXACIN HCL 500 MG PO TABS: 500.0000 mg | ORAL_TABLET | Freq: Two times a day (BID) | ORAL | 0 refills | Status: DC

## 2021-07-01 NOTE — Telephone Encounter (Signed)
Per 12/13 los next appt scheduled and given to patient

## 2021-07-01 NOTE — Assessment & Plan Note (Addendum)
New onset hematuria in the last 24 hours.  She does give a remote history of kidney stones.  She is really asymptomatic.  A urine culture is pending.  We will place her on antibiotics empirically with ciprofloxacin 500 mg twice daily for 10 days pending urine culture.  If she has no evidence of infection or persistent hematuria, we will refer her to urology.

## 2021-07-01 NOTE — Telephone Encounter (Signed)
Per Levada Dy, patient came in today w/issues - Reschedule 12/14 Labs, Follow Up to today w/Melissa

## 2021-07-01 NOTE — Telephone Encounter (Signed)
Patient son came in to let us know that is mom's gums are bleeding and she has some blood when she wipes after urinating. Dayton Scrape, FNP-BC aware and lab orders are being place. Patient and son notified

## 2021-07-01 NOTE — Assessment & Plan Note (Addendum)
The patient presents today with mild bleeding, but appears very short of breath with mild tachypnea. She has chronic lung disease she states she used her inhalers today, but not her nebulizers.  This may be aggravated by discontinuation of the steroids.  She is also quite anxious.  [facility]st x-ray did not reveal any acute cardiopulmonary disease.  We will have her follow-up with her primary care provider for continued care.

## 2021-07-01 NOTE — Assessment & Plan Note (Signed)
The patient completed 4 cycles of weekly rituximab and treatment of her ITP.  She was then completely weaned off of prednisone 2 weeks ago.  She was due for routine follow-up tomorrow, but comes in today reporting gum bleeding and hematuria.  She was unable to give a urine specimen.  Her platelets are normal at 164,000, so we will check a PT/APTT.  As the bleeding is mild, if these are normal, we will plan to see her back in 2 weeks with a CBC to continue monitoring her off of treatment.

## 2021-07-01 NOTE — Progress Notes (Signed)
[place]  [address] 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  She was then completely weaned off of prednisone 2 weeks ago.  She was due for routine follow-up tomorrow, but comes in today reporting gum bleeding and hematuria.  She was unable to give a urine specimen.  Her platelets are normal at 164,000, so we will check a PT/APTT.  As the bleeding is mild, if these are normal, we will plan to see her back in 2 weeks with a CBC to continue monitoring her off of treatment.  Dyspnea The patient presents today with mild bleeding, but appears very short of breath with mild tachypnea. She has chronic lung disease she states she used her inhalers today, but not her nebulizers.  This may be aggravated by discontinuation of the steroids.  She is also quite anxious.  We will obtain a chest x-ray for further evaluation.  We will then have her follow-up with her primary care provider for continued care.  Hematuria, gross New onset hematuria in the last 24 hours.  She does give a remote history of kidney stones.  She is really asymptomatic.  A urine culture is pending.  We will place her on antibiotics empirically with ciprofloxacin 500 mg twice daily for 10 days pending urine culture.  If she has no evidence of infection or persistent hematuria, we will refer her to urology.   The patient understands the plans discussed today and is in agreement with them.  She knows to contact our office if she develops concerns prior to her next appointment.   I provided 30 minutes of face-to-face time during this encounter and > 50% was spent counseling as documented under my assessment and plan.    Jean Pickles, PA-C  [facility]  AT [facility] [address] [place] [address] Dept: [phone] Dept Fax: [phone]   Orders Placed This Encounter  Procedures   Culture, Urine    Standing Status:   Future    Number of Occurrences:   1    Standing Expiration Date:   07/01/2022   Protime-INR    Standing Status:   Future    Number of Occurrences:   1    Standing Expiration Date:   07/01/2022   APTT    Standing Status:   Future    Number of Occurrences:   1    Standing Expiration Date:   07/01/2022      CHIEF COMPLAINT:  CC: ITP with new, bleeding and hematuria  Current Treatment: Observation   HISTORY OF PRESENT ILLNESS:  The patient Jean Kramer is a 70 year old female with a 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 She initially responded to that, but over the years has been on and off corticosteroids for relapsing ITP, and was also treated with danazol for a time.  Her ITP had been in remission for many years, but she does has had chronic leukocytosis.  She started smoking at age 58 and so she has been  smoking approximately 1 pack per day for 49 years and continues to smoke despite numerous discussions about smoking cessation.   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  We recommended an ultrasound-guided biopsy which revealed fibroadenomatoid and fibrocystic changes, including cystic and micro papillary apocrine metaplasia, apocrine adenosis, stromal fibrosis, and foci of pseudo angiomatous stromal hyperplasia and focal sclerosis.  It was recommended that she have excision of this area and that was performed in July by Dr. Melynda Ripple.  The final pathology revealed atrophic breast tissue with fibrocystic changes and  foci of usual ductal hyperplasia but no intraductal papilloma, fibroadenoma, atypia or malignancy.  There was another lumpectomy sample which revealed micropapillary ductal hyperplasia, focal ectatic ducts, and patchy peri ductal chronic inflammation.  The other area of lumpectomy had morphologic changes similar to the original biopsy.  We had been seeing her regularly, but then she was lost to follow-up after May 2019. She had a diagnostic right mammogram in April 2020 which revealed evolving fat necrosis.   She was admitted to [facility] in July 2022 due to developing an upper GI bleed from taking BC powders.  We had advised her to discontinue these previously.  She switched to Tylenol for her pain.  While in the hospital, her platelet count dropped into the teens, and she was given steroids and IVIG.   She had extensive bruising. She was in the hospital for a total of 15 days and her platelet count had gone up to 63,000 at the time of discharge.  She was referred back to [place].  She was given a prescription for prednisone at the time of discharge, but Dr. Hinton Rao advised her not to take this, as her platelet count was normal.  When we saw her in early August her platelet count was up to 168,000.  She had leukocytosis with white count was 14,000.  Her bruises had resolved and she was doing well.   She was then admitted to [facility] in August due to severe thrombocytopenia with a platelet count less than 5,000.  She presented to the hospital after she removed a scab from her knee, and the wound continued to ooze blood.  She received Nplate injection x2, as well as 2 units of PRBCs and 1 unit of platelets.  Upon discharge, her platelet count had improved to 17,000.  She was advised to discontinue aspirin 81 mg.  She once again had extensive bruising, but also noted shortness of breath with exertion, insomnia, and occasional dizziness and lightheadedness.  I saw her for hospital follow-up and she had  resolution of the leukocytosis and her platelet count was up to 41,000.  Chemistries revealed worsening elevation of the liver transaminases with an SGOT of 117 and an SGPT of 90.  She states that she has known fatty liver.  As her platelets remained low, she was started on prednisone 60 mg daily. We planned to monitor her CBC weekly and see her every 2 weeks.  Bilateral screening mammogram in August did not reveal any evidence of malignancy.  She initially had an excellent response to high-dose prednisone, but when we tapered her rapidly, she had recurrent severe thrombocytopenia.  We were unable to slowly taper the prednisone, so she went on to receive rituximab weekly for 4 weeks completed on her 17th with an excellent response.  She was rapidly tapered off of prednisone while receiving rituximab.  2  weeks ago prednisone was discontinued lately count remains normal at 151,000.   INTERVAL HISTORY:  [place] is added to the schedule today as she reports gum bleeds hematuria.  She denies dysuria, frequency, hesitancy or urgency of urination.  She also reports shortness of breath especially with exertion.  She states that she used her inhaler treatment today but not her nebulized treatment.  She denies cough or chest pain. She denies fevers or chills. She denies pain. Her appetite is good. Her weight has been stable.  REVIEW OF SYSTEMS:  Review of Systems  Constitutional:  Negative for appetite change, chills, fatigue, fever and unexpected weight change.  HENT:   Negative for lump/mass, mouth sores, nosebleeds and sore throat.   Respiratory:  Positive for shortness of breath. Negative for cough.   Cardiovascular:  Negative for chest pain and leg swelling.  Gastrointestinal:  Negative for abdominal pain, constipation, diarrhea, nausea and vomiting.  Genitourinary:  Positive for hematuria. Negative for difficulty urinating, dysuria and frequency.   Musculoskeletal:  Negative for arthralgias, back pain and  myalgias.  Skin:  Negative for rash.  Neurological:  Negative for dizziness and headaches.  Hematological:  Negative for adenopathy. Does not bruise/bleed easily.  Psychiatric/Behavioral:  Negative for depression and sleep disturbance. The patient is not nervous/anxious.     VITALS:  Blood pressure 92/61, pulse (!) 101, temperature 98 F (36.7 C), resp. rate (!) 22, height 5\' 2"  (1.575 m), weight 189 lb 6.4 oz (85.9 kg), SpO2 92 %.  Wt Readings from Last 3 Encounters:  07/01/21 189 lb 6.4 oz (85.9 kg)  06/18/21 190 lb (86.2 kg)  06/05/21 190 lb 1.3 oz (86.2 kg)    Body mass index is 34.64 kg/m.  Performance status (ECOG): 2 - Symptomatic, <50% confined to bed  PHYSICAL EXAM:  Physical Exam Vitals and nursing note reviewed.  Constitutional:      General: She is not in acute distress.    Appearance: Normal appearance.  HENT:     Head: Normocephalic and atraumatic.     Mouth/Throat:     Mouth: Mucous membranes are moist.     Pharynx: Oropharynx is clear. No oropharyngeal exudate or posterior oropharyngeal erythema.  Eyes:     General: No scleral icterus.    Extraocular Movements: Extraocular movements intact.     Conjunctiva/sclera: Conjunctivae normal.     Pupils: Pupils are equal, round, and reactive to light.  Cardiovascular:     Rate and Rhythm: Normal rate and regular rhythm.     Heart sounds: Normal heart sounds. No murmur heard.   No friction rub. No gallop.  Pulmonary:     Effort: Pulmonary effort is normal. Tachypnea (Mild to moderate) present.     Breath sounds: Normal breath sounds. No wheezing, rhonchi or rales.  Abdominal:     General: There is no distension.     Palpations: Abdomen is soft. There is no hepatomegaly, splenomegaly or mass.     Tenderness: There is no abdominal tenderness.  Musculoskeletal:        General: Normal range of motion.     Cervical back: Normal range of motion and neck supple. No tenderness.     Right lower leg: No edema.     Left  lower leg: No edema.  Lymphadenopathy:     Cervical: No cervical adenopathy.  Skin:    General: Skin is warm and dry.     Coloration: Skin is not jaundiced.     Findings: No rash.  Neurological:  Mental Status: She is alert and oriented to person, place, and time.     Cranial Nerves: No cranial nerve deficit.  Psychiatric:        Mood and Affect: Mood normal.        Behavior: Behavior normal.        Thought Content: Thought content normal.    LABS:   CBC Latest Ref Rng & Units 07/01/2021 06/18/2021 06/11/2021  WBC - 9.3 15.1 14.0  Hemoglobin 12.0 - 16.0 15.0 14.7 14.4  Hematocrit 36 - 46 45 44 44  Platelets 150 - 399 164 151 168   CMP Latest Ref Rng & Units 07/01/2021 06/18/2021 06/04/2021  Glucose 70 - 99 mg/dL - - -  BUN 4 - 21 17 26(A) 28(A)  Creatinine 0.5 - 1.1 0.9 1.4(A) 0.8  Sodium 137 - 147 139 136(A) 140  Potassium 3.4 - 5.3 4.2 4.5 4.6  Chloride 99 - 108 105 101 100  CO2 13 - 22 24(A) 23(A) 30(A)  Calcium 8.7 - 10.7 10.0 10.3 10.0  Total Protein 6.5 - 8.1 g/dL - - -  Total Bilirubin 0.3 - 1.2 mg/dL - - -  Alkaline Phos 25 - 125 82 90 116  AST 13 - 35 113(A) 109(A) 137(A)  ALT 7 - 35 103(A) 121(A) 144(A)     No results found for: CEA1 / No results found for: CEA1 No results found for: PSA1 No results found for: WGN562 No results found for: ZHY865  No results found for: TOTALPROTELP, ALBUMINELP, A1GS, A2GS, BETS, BETA2SER, GAMS, MSPIKE, SPEI Lab Results  Component Value Date   TIBC 330 02/08/2021   FERRITIN 137 02/08/2021   IRONPCTSAT 11 02/08/2021   No results found for: LDH  STUDIES:  No results found.    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  Social History:  reports that she has been smoking cigarettes. She has never used smokeless tobacco. No history on file for alcohol use and drug use.The patient is accompanied by her son today.  Allergies: No Known Allergies  Current Medications: Current Outpatient Medications  Medication Sig Dispense Refill   ciprofloxacin (CIPRO) 500 MG tablet Take 1 tablet (500 mg total) by mouth 2 (two) times daily. 20 tablet 0   acetaminophen (TYLENOL) 500 MG tablet Take 1,000 mg by mouth every 6 (six) hours as needed for mild pain.     alendronate (FOSAMAX) 70 MG tablet Take 70 mg by mouth once a week. Friday     ALPRAZolam (XANAX) 0.5 MG tablet Take 1 tablet (0.5 mg total) by mouth 3 (three) times daily as needed for anxiety. 90 tablet 0   Budeson-Glycopyrrol-Formoterol (BREZTRI AEROSPHERE) 160-9-4.8 MCG/ACT AERO 2 puffs     budesonide-formoterol (SYMBICORT) 160-4.5 MCG/ACT inhaler Inhale 2 puffs into the lungs 2 (two) times daily as needed (shortness of breath).     busPIRone (BUSPAR) 15 MG tablet Take 15 mg by mouth 2 (two) times daily.     calcium-vitamin D (OSCAL WITH D) 500-200 MG-UNIT TABS tablet Take  1 tablet by mouth 2 (two) times daily with a meal.     furosemide (LASIX) 20 MG tablet Take 20 mg by mouth daily.     ipratropium-albuterol (DUONEB) 0.5-2.5 (3) MG/3ML SOLN Inhale 3 mLs into the lungs every 6 (six) hours as needed for shortness of breath.     levothyroxine (SYNTHROID) 25 MCG tablet Take 25 mcg by mouth every morning.     LINZESS 290 MCG CAPS capsule Take 290 mcg by mouth daily as needed (constipation).     Omega-3 1000 MG CAPS Take 2 capsules by mouth daily.     ondansetron (ZOFRAN) 4 MG tablet Take 1 tablet (4 mg total) by mouth every 4 (four) hours as needed for nausea. 90 tablet 3   Oxycodone HCl 20 MG TABS Take 20 mg by mouth every 8 (eight) hours as  needed (pain).     pantoprazole (PROTONIX) 40 MG tablet Take 1 tablet (40 mg total) by mouth 2 (two) times daily. 60 tablet 2   pramipexole (MIRAPEX) 0.5 MG tablet Take 0.5 mg by mouth at bedtime.     predniSONE (DELTASONE) 10 MG tablet Take 1 tablet (10 mg total) by mouth daily with breakfast. 30 tablet 0   prochlorperazine (COMPAZINE) 10 MG tablet Take 1 tablet (10 mg total) by mouth every 6 (six) hours as needed for nausea or vomiting. 90 tablet 3   QUEtiapine (SEROQUEL) 200 MG tablet Take 200 mg by mouth at bedtime.     rOPINIRole (REQUIP) 0.5 MG tablet Take 0.5 mg by mouth 3 (three) times daily.     No current facility-administered medications for this visit.

## 2021-07-02 ENCOUNTER — Other Ambulatory Visit: Payer: 59

## 2021-07-02 ENCOUNTER — Ambulatory Visit: Payer: 59 | Admitting: Oncology

## 2021-07-03 ENCOUNTER — Telehealth: Payer: Self-pay

## 2021-07-03 ENCOUNTER — Encounter: Payer: Self-pay | Admitting: Hematology and Oncology

## 2021-07-03 ENCOUNTER — Encounter: Payer: Self-pay | Admitting: Oncology

## 2021-07-03 ENCOUNTER — Other Ambulatory Visit: Payer: Self-pay

## 2021-07-03 DIAGNOSIS — R310 Gross hematuria: Secondary | ICD-10-CM

## 2021-07-03 NOTE — Progress Notes (Signed)
ambulatory

## 2021-07-03 NOTE — Telephone Encounter (Signed)
-----   Message from Marvia Pickles, PA-C sent at 07/03/2021  1:27 PM EST ----- Please refer to Dr. Nila Nephew for gross hematuria.

## 2021-07-03 NOTE — Telephone Encounter (Signed)
Referral faxed to Dr Nila Nephew for gross hematuria, hx of kidney stones. Patient aware.

## 2021-07-04 ENCOUNTER — Encounter: Payer: Self-pay | Admitting: Oncology

## 2021-07-08 ENCOUNTER — Encounter: Payer: Self-pay | Admitting: Oncology

## 2021-07-09 NOTE — Progress Notes (Signed)
[facility]  [address] [place],  [place]  [address] [phone]  Clinic Day:  07/16/2021  Referring physician: [name], MD  This document serves as a record of services personally performed by [name], MD. It was created on their behalf by Curry,[name], a trained medical scribe. The creation of this record is based on the scribe's personal observations and the provider's statements to them.  ASSESSMENT & PLAN:   Assessment & Plan: History of ITP with multiple relapses.  The patient completed 4 cycles of weekly rituximab and treatment of her ITP in November 2022.  She was then completely weaned off of prednisone.    History of splenectomy, twice.  Chronic leukocytosis.  Tobacco abuse. She is not motivated to quit, but has cut back.  History of lumpectomy in July 2018 which was benign.  Osteopenia, stable to improved. She will be due for repeat examination in December 2022, which will be scheduled through Dr. Jannette Fogo.  Elevated liver transaminases. She states that Dr. Jannette Fogo has evaluated her with imaging and labs and she was found to have fatty liver. She has been taking Tylenol 1000 mg TID, and I advised that she cut back to 500 mg TID. We may need to eliminate Tylenol all together, but I doubt that this is really the cause of her abnormal liver function tests. We discussed the need for weight loss, control of her blood sugar and cholesterol.   Dyspnea. She has chronic lung disease she states she uses her inhalers and nebulizers.    She continues to do fairly well, and her platelets remain normal, and so we will have her return in 2 weeks with repeat CBC. Otherwise, we will plan to see her back in 1 month with a CBC to continue monitoring her off of treatment. The patient understands the plans discussed today and is in agreement with them.  She knows to contact our office if she develops concerns prior to her next  appointment.   I provided 15 minutes of face-to-face time during this encounter and > 50% was spent counseling as documented under my assessment and plan.    [facility] [address] [place] [address] Dept: [phone] Dept Fax: [phone]   No orders of the defined types were placed in this encounter.     CHIEF COMPLAINT:  CC: ITP with new, bleeding and hematuria  Current Treatment: Observation   HISTORY OF PRESENT ILLNESS:  The patient Jean Kramer is a 70 year old female with a 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 She initially responded to that, but over the years has been on and off corticosteroids for relapsing ITP, and was also treated with danazol for a time.  Her ITP had been in remission for many years, but she does has had chronic leukocytosis.  She started smoking at age 75 and so she has been smoking approximately 1 pack per day for 49 years and continues to smoke despite numerous discussions about smoking cessation.   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  We recommended an ultrasound-guided biopsy which revealed fibroadenomatoid and fibrocystic changes, including  cystic and micro papillary apocrine metaplasia, apocrine adenosis, stromal fibrosis, and foci of pseudo angiomatous stromal hyperplasia and focal sclerosis.  It was recommended that she have excision of this area and that was performed in July by Dr. Melynda Ripple.  The final pathology revealed atrophic breast tissue with fibrocystic changes and foci of usual ductal hyperplasia but no intraductal papilloma, fibroadenoma, atypia or malignancy.  There was another  lumpectomy sample which revealed micropapillary ductal hyperplasia, focal ectatic ducts, and patchy peri ductal chronic inflammation.  The other area of lumpectomy had morphologic changes similar to the original biopsy.  We had been seeing her regularly, but then she was lost to follow-up after May 2019. She had a diagnostic right mammogram in April 2020 which revealed evolving fat necrosis.   She was admitted to [facility] in July 2022 due to developing an upper GI bleed from taking BC powders.  We had advised her to discontinue these previously.  She switched to Tylenol for her pain.  While in the hospital, her platelet count dropped into the teens, and she was given steroids and IVIG.   She had extensive bruising. She was in the hospital for a total of 15 days and her platelet count had gone up to 63,000 at the time of discharge.  She was referred back to [place].  She was given a prescription for prednisone at the time of discharge, but Dr. Hinton Rao advised her not to take this, as her platelet count was normal.  When we saw her in early August her platelet count was up to 168,000.  She had leukocytosis with white count was 14,000.  Her bruises had resolved and she was doing well.   She was then admitted to [facility] in August due to severe thrombocytopenia with a platelet count less than 5,000.  She presented to the hospital after she removed a scab from her knee, and the wound continued to ooze blood.  She received Nplate injection x2, as well as 2 units of PRBCs and 1 unit of platelets.  Upon discharge, her platelet count had improved to 17,000.  She was advised to discontinue aspirin 81 mg.  She once again had extensive bruising, but also noted shortness of breath with exertion, insomnia, and occasional dizziness and lightheadedness.  I saw her for hospital follow-up and she had resolution of the leukocytosis and her platelet count was up to 41,000.  Chemistries revealed worsening elevation of the  liver transaminases with an SGOT of 117 and an SGPT of 90.  She states that she has known fatty liver.  As her platelets remained low, she was started on prednisone 60 mg daily. We planned to monitor her CBC weekly and see her every 2 weeks.  Bilateral screening mammogram in August did not reveal any evidence of malignancy.  She initially had an excellent response to high-dose prednisone, but when we tapered her rapidly, she had recurrent severe thrombocytopenia.  We were unable to slowly taper the prednisone, so she went on to receive rituximab weekly for 4 weeks completed on her 17th with an excellent response.  She was rapidly tapered off of prednisone while receiving rituximab.  2 weeks ago prednisone was discontinued lately count remains normal at 151,000.   INTERVAL HISTORY:  Jean Kramer is here for follow up and states that she has been doing fairly well. She continues to have shortness of breath for which she is using nebulizer treatments. She notes intermittent epigastric cramps for the past year. She  states that she was taking Goody Powders previously. She has been cutting back on cigarettes, but continues to smoke. Platelets remain normal at 237,000, white count has increased from 9.3 to 11.8, and hemoglobin is normal. Chemistries are unremarkable except for elevated liver transaminases, worsening. Her  appetite is good, and she has gained 5 and 1/2 pounds since her last visit.  She denies fever, chills or other signs of infection.  She denies nausea, vomiting, bowel issues, or abdominal pain.  She denies sore throat, cough, dyspnea, or chest pain.  REVIEW OF SYSTEMS:  Review of Systems  Constitutional: Negative.  Negative for appetite change, chills, fatigue, fever and unexpected weight change.  HENT:  Negative.    Eyes: Negative.   Respiratory:  Positive for shortness of breath (chronic). Negative for chest tightness, cough, hemoptysis and wheezing.   Cardiovascular: Negative.  Negative for chest  pain, leg swelling and palpitations.  Gastrointestinal:  Positive for abdominal pain (epigastric, intermittent). Negative for abdominal distention, blood in stool, constipation, diarrhea, nausea and vomiting.  Endocrine: Negative.   Genitourinary: Negative.  Negative for difficulty urinating, dysuria, frequency and hematuria.   Musculoskeletal:  Positive for gait problem (uses a cane). Negative for arthralgias, back pain, flank pain and myalgias.  Skin: Negative.   Neurological:  Positive for gait problem (uses a cane). Negative for dizziness, extremity weakness, headaches, light-headedness, numbness, seizures and speech difficulty.  Hematological: Negative.   Psychiatric/Behavioral: Negative.  Negative for depression and sleep disturbance. The patient is not nervous/anxious.     VITALS:  Blood pressure (!) 89/54, pulse 96, resp. rate 20, height 5\' 2"  (1.575 m), weight 194 lb 11.2 oz (88.3 kg), SpO2 95 %.  Wt Readings from Last 3 Encounters:  07/16/21 194 lb 11.2 oz (88.3 kg)  07/01/21 189 lb 6.4 oz (85.9 kg)  06/18/21 190 lb (86.2 kg)    Body mass index is 35.61 kg/m.  Performance status (ECOG): 1 - Symptomatic but completely ambulatory  PHYSICAL EXAM:  Physical Exam Constitutional:      General: She is not in acute distress.    Appearance: Normal appearance. She is normal weight.  HENT:     Head: Normocephalic and atraumatic.  Eyes:     General: No scleral icterus.    Extraocular Movements: Extraocular movements intact.     Conjunctiva/sclera: Conjunctivae normal.     Pupils: Pupils are equal, round, and reactive to light.  Cardiovascular:     Rate and Rhythm: Normal rate and regular rhythm.     Pulses: Normal pulses.     Heart sounds: Normal heart sounds. No murmur heard.   No friction rub. No gallop.  Pulmonary:     Effort: Pulmonary effort is normal. No respiratory distress.     Breath sounds: Normal breath sounds.  Abdominal:     General: Bowel sounds are normal.  There is no distension.     Palpations: Abdomen is soft. There is no hepatomegaly, splenomegaly or mass.     Tenderness: There is no abdominal tenderness.  Musculoskeletal:        General: Normal range of motion.     Cervical back: Normal range of motion and neck supple.     Right lower leg: No edema.     Left lower leg: No edema.  Lymphadenopathy:     Cervical: No cervical adenopathy.  Skin:    General: Skin is warm and dry.  Neurological:     General: No focal deficit present.     Mental Status:  She is alert and oriented to person, place, and time. Mental status is at baseline.  Psychiatric:        Mood and Affect: Mood normal.        Behavior: Behavior normal.        Thought Content: Thought content normal.        Judgment: Judgment normal.    LABS:   CBC Latest Ref Rng & Units 07/16/2021 07/01/2021 06/18/2021  WBC - 11.8 9.3 15.1  Hemoglobin 12.0 - 16.0 14.4 15.0 14.7  Hematocrit 36 - 46 43 45 44  Platelets 150 - 399 237 164 151   CMP Latest Ref Rng & Units 07/16/2021 07/01/2021 06/18/2021  Glucose 70 - 99 mg/dL - - -  BUN 4 - 21 18 17  26(A)  Creatinine 0.5 - 1.1 0.8 0.9 1.4(A)  Sodium 137 - 147 140 139 136(A)  Potassium 3.4 - 5.3 4.4 4.2 4.5  Chloride 99 - 108 106 105 101  CO2 13 - 22 27(A) 24(A) 23(A)  Calcium 8.7 - 10.7 9.4 10.0 10.3  Total Protein 6.5 - 8.1 g/dL - - -  Total Bilirubin 0.3 - 1.2 mg/dL - - -  Alkaline Phos 25 - 125 92 82 90  AST 13 - 35 165(A) 113(A) 109(A)  ALT 7 - 35 138(A) 103(A) 121(A)    Lab Results  Component Value Date   TIBC 330 02/08/2021   FERRITIN 137 02/08/2021   IRONPCTSAT 11 02/08/2021   No results found for: LDH  STUDIES:  No results found.    HISTORY:   Allergies: No Known Allergies  Current Medications: Current Outpatient Medications  Medication Sig Dispense Refill   acetaminophen (TYLENOL) 500 MG tablet Take 1,000 mg by mouth every 6 (six) hours as needed for mild pain.     alendronate (FOSAMAX) 70 MG tablet  Take 70 mg by mouth once a week. Friday     ALPRAZolam (XANAX) 1 MG tablet Take 0.5-1 mg by mouth 2 (two) times daily.     Budeson-Glycopyrrol-Formoterol (BREZTRI AEROSPHERE) 160-9-4.8 MCG/ACT AERO 2 puffs     budesonide-formoterol (SYMBICORT) 160-4.5 MCG/ACT inhaler Inhale 2 puffs into the lungs 2 (two) times daily as needed (shortness of breath).     busPIRone (BUSPAR) 15 MG tablet Take 15 mg by mouth 2 (two) times daily.     calcium-vitamin D (OSCAL WITH D) 500-200 MG-UNIT TABS tablet Take 1 tablet by mouth 2 (two) times daily with a meal.     furosemide (LASIX) 20 MG tablet Take 20 mg by mouth daily.     ipratropium-albuterol (DUONEB) 0.5-2.5 (3) MG/3ML SOLN Inhale 3 mLs into the lungs every 6 (six) hours as needed for shortness of breath.     levothyroxine (SYNTHROID) 25 MCG tablet Take 25 mcg by mouth every morning.     LINZESS 290 MCG CAPS capsule Take 290 mcg by mouth daily as needed (constipation).     Omega-3 1000 MG CAPS Take 2 capsules by mouth daily.     ondansetron (ZOFRAN) 4 MG tablet Take 1 tablet (4 mg total) by mouth every 4 (four) hours as needed for nausea. 90 tablet 3   Oxycodone HCl 20 MG TABS Take 20 mg by mouth every 8 (eight) hours as needed (pain).     pantoprazole (PROTONIX) 40 MG tablet Take 1 tablet (40 mg total) by mouth 2 (two) times daily. 60 tablet 2   pramipexole (MIRAPEX) 0.5 MG tablet Take 0.5 mg by mouth at bedtime.  prochlorperazine (COMPAZINE) 10 MG tablet Take 1 tablet (10 mg total) by mouth every 6 (six) hours as needed for nausea or vomiting. 90 tablet 3   QUEtiapine (SEROQUEL) 200 MG tablet Take 200 mg by mouth at bedtime.     rOPINIRole (REQUIP) 0.5 MG tablet Take 0.5 mg by mouth 3 (three) times daily.     simvastatin (ZOCOR) 40 MG tablet Take 40 mg by mouth at bedtime.     No current facility-administered medications for this visit.    I, Rita Ohara, am acting as scribe for [name], MD  I have reviewed this report as typed by the  medical scribe, and it is complete and accurate.

## 2021-07-16 ENCOUNTER — Inpatient Hospital Stay: Payer: Medicare Other

## 2021-07-16 ENCOUNTER — Telehealth: Payer: Self-pay | Admitting: Oncology

## 2021-07-16 ENCOUNTER — Other Ambulatory Visit: Payer: Self-pay | Admitting: Hematology and Oncology

## 2021-07-16 ENCOUNTER — Encounter: Payer: Self-pay | Admitting: Oncology

## 2021-07-16 ENCOUNTER — Inpatient Hospital Stay (INDEPENDENT_AMBULATORY_CARE_PROVIDER_SITE_OTHER): Payer: Medicare Other | Admitting: Oncology

## 2021-07-16 VITALS — BP 89/54 | HR 96 | Resp 20 | Ht 62.0 in | Wt 194.7 lb

## 2021-07-16 DIAGNOSIS — D72829 Elevated white blood cell count, unspecified: Secondary | ICD-10-CM

## 2021-07-16 DIAGNOSIS — D693 Immune thrombocytopenic purpura: Secondary | ICD-10-CM

## 2021-07-16 DIAGNOSIS — Z9081 Acquired absence of spleen: Secondary | ICD-10-CM

## 2021-07-16 LAB — BASIC METABOLIC PANEL
BUN: 18 (ref 4–21)
CO2: 27 — AB (ref 13–22)
Chloride: 106 (ref 99–108)
Creatinine: 0.8 (ref 0.5–1.1)
Glucose: 105
Potassium: 4.4 (ref 3.4–5.3)
Sodium: 140 (ref 137–147)

## 2021-07-16 LAB — COMPREHENSIVE METABOLIC PANEL
Albumin: 4.1 (ref 3.5–5.0)
Calcium: 9.4 (ref 8.7–10.7)

## 2021-07-16 LAB — HEPATIC FUNCTION PANEL
ALT: 138 — AB (ref 7–35)
AST: 165 — AB (ref 13–35)
Alkaline Phosphatase: 92 (ref 25–125)
Bilirubin, Total: 0.5

## 2021-07-16 LAB — CBC AND DIFFERENTIAL
HCT: 43 (ref 36–46)
Hemoglobin: 14.4 (ref 12.0–16.0)
Neutrophils Absolute: 7.55
Platelets: 237 (ref 150–399)
WBC: 11.8

## 2021-07-16 LAB — CBC: RBC: 4.24 (ref 3.87–5.11)

## 2021-07-16 NOTE — Telephone Encounter (Signed)
Per 12/28 los next appt scheduled and given to patient °

## 2021-07-30 ENCOUNTER — Inpatient Hospital Stay: Payer: Medicare Other | Attending: Oncology

## 2021-07-30 ENCOUNTER — Other Ambulatory Visit: Payer: Self-pay | Admitting: Hematology and Oncology

## 2021-07-30 ENCOUNTER — Other Ambulatory Visit: Payer: Self-pay

## 2021-07-30 DIAGNOSIS — D693 Immune thrombocytopenic purpura: Secondary | ICD-10-CM

## 2021-07-30 LAB — HEPATIC FUNCTION PANEL
ALT: 139 — AB (ref 7–35)
AST: 193 — AB (ref 13–35)
Alkaline Phosphatase: 81 (ref 25–125)
Bilirubin, Total: 0.8

## 2021-07-30 LAB — CBC AND DIFFERENTIAL
HCT: 45 (ref 36–46)
Hemoglobin: 15.2 (ref 12.0–16.0)
Neutrophils Absolute: 5.59
Platelets: 253 (ref 150–399)
WBC: 9.8

## 2021-07-30 LAB — BASIC METABOLIC PANEL
BUN: 14 (ref 4–21)
CO2: 26 — AB (ref 13–22)
Chloride: 103 (ref 99–108)
Creatinine: 0.8 (ref 0.5–1.1)
Glucose: 120
Potassium: 4 (ref 3.4–5.3)
Sodium: 139 (ref 137–147)

## 2021-07-30 LAB — CBC: RBC: 4.42 (ref 3.87–5.11)

## 2021-07-30 LAB — COMPREHENSIVE METABOLIC PANEL
Albumin: 4.2 (ref 3.5–5.0)
Calcium: 10 (ref 8.7–10.7)

## 2021-08-15 ENCOUNTER — Inpatient Hospital Stay: Payer: Medicare Other

## 2021-08-15 ENCOUNTER — Encounter: Payer: Self-pay | Admitting: Hematology and Oncology

## 2021-08-15 ENCOUNTER — Other Ambulatory Visit: Payer: Self-pay

## 2021-08-15 ENCOUNTER — Inpatient Hospital Stay (INDEPENDENT_AMBULATORY_CARE_PROVIDER_SITE_OTHER): Payer: Medicare Other | Admitting: Hematology and Oncology

## 2021-08-15 ENCOUNTER — Telehealth: Payer: Self-pay | Admitting: Hematology and Oncology

## 2021-08-15 DIAGNOSIS — D693 Immune thrombocytopenic purpura: Secondary | ICD-10-CM

## 2021-08-15 DIAGNOSIS — R748 Abnormal levels of other serum enzymes: Secondary | ICD-10-CM | POA: Diagnosis not present

## 2021-08-15 LAB — BASIC METABOLIC PANEL
BUN: 27 — AB (ref 4–21)
CO2: 26 — AB (ref 13–22)
Chloride: 103 (ref 99–108)
Creatinine: 1 (ref 0.5–1.1)
Glucose: 94
Potassium: 4.4 (ref 3.4–5.3)
Sodium: 138 (ref 137–147)

## 2021-08-15 LAB — COMPREHENSIVE METABOLIC PANEL
Albumin: 4.3 (ref 3.5–5.0)
Calcium: 9.7 (ref 8.7–10.7)

## 2021-08-15 LAB — HEPATIC FUNCTION PANEL
ALT: 131 — AB (ref 7–35)
AST: 178 — AB (ref 13–35)
Alkaline Phosphatase: 118 (ref 25–125)
Bilirubin, Total: 0.7

## 2021-08-15 LAB — CBC AND DIFFERENTIAL
HCT: 45 (ref 36–46)
Hemoglobin: 15.5 (ref 12.0–16.0)
Neutrophils Absolute: 7.49
Platelets: 206 (ref 150–399)
WBC: 11.7

## 2021-08-15 LAB — CBC: RBC: 4.39 (ref 3.87–5.11)

## 2021-08-15 NOTE — Assessment & Plan Note (Addendum)
History of ITP with multiple relapses. The patient completed 4 cycles of weekly rituximab and treatment of her ITP in November 2022. She was then completely weaned off of prednisone. Platelets today are 206. We will plan to evaluate in 4 weeks.

## 2021-08-15 NOTE — Telephone Encounter (Signed)
Per 08/15/21 los next appt scheduled and confirmed with patient

## 2021-08-15 NOTE — Progress Notes (Signed)
Patient Care Team: [name], MD as PCP - General (Internal Medicine) [name], MD as Consulting Physician (Oncology)  Clinic Day:  08/15/2021  Referring physician: [name], MD  ASSESSMENT & PLAN:   Assessment & Plan: Chronic ITP (idiopathic thrombocytopenia) (HCC) History of ITP with multiple relapses.  The patient completed 4 cycles of weekly rituximab and treatment of her ITP in November 2022.  She was then completely weaned off of prednisone.  Platelets today are 206. We will plan to evaluate in 4 weeks.   Abnormal transaminases Elevated liver transaminases. She states that Dr. Jannette Fogo has evaluated her with imaging and labs and she was found to have fatty liver. These have improved today. She continues to follow with PCP.    The patient understands the plans discussed today and is in agreement with them.  She knows to contact our office if she develops concerns prior to her next appointment.    [name], NP  [facility] [address] [place] [address] Dept: [phone] Dept Fax: [phone]   Orders Placed This Encounter  Procedures   CBC and differential    This external order was created through the Results Console.   CBC    This external order was created through the Results Console.   Basic metabolic panel    This external order was created through the Results Console.   Comprehensive metabolic panel    This external order was created through the Results Console.   Hepatic function panel    This external order was created through the Results Console.      CHIEF COMPLAINT:  CC: A 71 year old female with history of ITP here for one month evaluation  Current Treatment:  Surveillance  INTERVAL HISTORY:  Jean Kramer is here today for repeat clinical assessment. She denies fevers or chills. She denies pain. Her appetite is good. Her weight has been stable.  I have  reviewed the past medical history, past surgical history, social history and family history with the patient and they are unchanged from previous note.  ALLERGIES:  has No Known Allergies.  MEDICATIONS:  Current Outpatient Medications  Medication Sig Dispense Refill   Oxycodone HCl 20 MG TABS 1 tablet as needed     acetaminophen (TYLENOL) 500 MG tablet Take 1,000 mg by mouth every 6 (six) hours as needed for mild pain.     alendronate (FOSAMAX) 70 MG tablet Take 70 mg by mouth once a week. Friday     Budeson-Glycopyrrol-Formoterol (BREZTRI AEROSPHERE) 160-9-4.8 MCG/ACT AERO 2 puffs     busPIRone (BUSPAR) 15 MG tablet Take 15 mg by mouth 2 (two) times daily.     calcium-vitamin D (OSCAL WITH D) 500-200 MG-UNIT TABS tablet Take 1 tablet by mouth 2 (two) times daily with a meal.     furosemide (LASIX) 20 MG tablet Take 20 mg by mouth daily.     ipratropium-albuterol (DUONEB) 0.5-2.5 (3) MG/3ML SOLN Inhale 3 mLs into the lungs every 6 (six) hours as needed for shortness of breath.     levothyroxine (SYNTHROID) 25 MCG tablet Take 25 mcg by mouth every morning.     Omega-3 1000 MG CAPS Take 2 capsules by mouth daily.     ondansetron (ZOFRAN) 4 MG tablet Take 1 tablet (4 mg total) by mouth every 4 (four) hours as needed for nausea. 90 tablet 3   pramipexole (MIRAPEX) 0.5 MG tablet Take 0.5 mg by  mouth at bedtime.     prochlorperazine (COMPAZINE) 10 MG tablet Take 1 tablet (10 mg total) by mouth every 6 (six) hours as needed for nausea or vomiting. 90 tablet 3   QUEtiapine (SEROQUEL) 200 MG tablet Take 200 mg by mouth at bedtime.     rOPINIRole (REQUIP) 0.5 MG tablet Take 0.5 mg by mouth 3 (three) times daily.     No current facility-administered medications for this visit.    HISTORY OF PRESENT ILLNESS:   Oncology History   No history exists.      REVIEW OF SYSTEMS:   Constitutional: Denies fevers, chills or abnormal weight loss Eyes: Denies blurriness of vision Ears, nose, mouth,  throat, and face: Denies mucositis or sore throat Respiratory: Denies cough, dyspnea or wheezes Cardiovascular: Denies palpitation, chest discomfort or lower extremity swelling Gastrointestinal:  Denies nausea, heartburn or change in bowel habits Skin: Denies abnormal skin rashes Lymphatics: Denies new lymphadenopathy or easy bruising Neurological:Denies numbness, tingling or new weaknesses Behavioral/Psych: Mood is stable, no new changes  All other systems were reviewed with the patient and are negative.   VITALS:  Blood pressure 111/62, pulse 92, temperature 98.3 F (36.8 C), temperature source Oral, resp. rate 20, height 5\' 2"  (1.575 m), weight 190 lb 1.6 oz (86.2 kg), SpO2 94 %.  Wt Readings from Last 3 Encounters:  08/15/21 190 lb 1.6 oz (86.2 kg)  07/16/21 194 lb 11.2 oz (88.3 kg)  07/01/21 189 lb 6.4 oz (85.9 kg)    Body mass index is 34.77 kg/m.  Performance status (ECOG): 1 - Symptomatic but completely ambulatory  PHYSICAL EXAM:   GENERAL:alert, no distress and comfortable SKIN: skin color, texture, turgor are normal, no rashes or significant lesions EYES: normal, Conjunctiva are pink and non-injected, sclera clear OROPHARYNX:no exudate, no erythema and lips, buccal mucosa, and tongue normal  NECK: supple, thyroid normal size, non-tender, without nodularity LYMPH:  no palpable lymphadenopathy in the cervical, axillary or inguinal LUNGS: clear to auscultation and percussion with normal breathing effort HEART: regular rate & rhythm and no murmurs and no lower extremity edema ABDOMEN:abdomen soft, non-tender and normal bowel sounds Musculoskeletal:no cyanosis of digits and no clubbing  NEURO: alert & oriented x 3 with fluent speech, no focal motor/sensory deficits  LABORATORY DATA:  I have reviewed the data as listed    Component Value Date/Time   NA 138 08/15/2021 0000   K 4.4 08/15/2021 0000   CL 103 08/15/2021 0000   CO2 26 (A) 08/15/2021 0000   GLUCOSE 203 (H)  03/06/2021 0350   BUN 27 (A) 08/15/2021 0000   CREATININE 1.0 08/15/2021 0000   CREATININE 0.95 03/06/2021 0350   CALCIUM 9.7 08/15/2021 0000   PROT 6.8 03/06/2021 0350   ALBUMIN 4.3 08/15/2021 0000   AST 178 (A) 08/15/2021 0000   ALT 131 (A) 08/15/2021 0000   ALKPHOS 118 08/15/2021 0000   BILITOT 0.6 03/06/2021 0350   GFRNONAA >60 03/06/2021 0350    No results found for: SPEP, UPEP  Lab Results  Component Value Date   WBC 11.7 08/15/2021   NEUTROABS 7.49 08/15/2021   HGB 15.5 08/15/2021   HCT 45 08/15/2021   MCV 101 (A) 06/16/2021   PLT 206 08/15/2021      Chemistry      Component Value Date/Time   NA 138 08/15/2021 0000   K 4.4 08/15/2021 0000   CL 103 08/15/2021 0000   CO2 26 (A) 08/15/2021 0000   BUN 27 (A) 08/15/2021 0000  CREATININE 1.0 08/15/2021 0000   CREATININE 0.95 03/06/2021 0350   GLU 94 08/15/2021 0000      Component Value Date/Time   CALCIUM 9.7 08/15/2021 0000   ALKPHOS 118 08/15/2021 0000   AST 178 (A) 08/15/2021 0000   ALT 131 (A) 08/15/2021 0000   BILITOT 0.6 03/06/2021 0350       RADIOGRAPHIC STUDIES: I have personally reviewed the radiological images as listed and agreed with the findings in the report. No results found.

## 2021-08-15 NOTE — Assessment & Plan Note (Signed)
Elevated liver transaminases. She states that Dr. Jannette Fogo has evaluated her with imaging and labs and she was found to have fatty liver.These have improved today. She continues to follow with PCP.

## 2021-09-09 NOTE — Progress Notes (Signed)
[facility]  [address] [place],  [place]  [address] [phone]  Clinic Day:  09/15/2021  Referring physician: [name], MD  This document serves as a record of services personally performed by [name], MD. It was created on their behalf by Curry,[name], a trained medical scribe. The creation of this record is based on the scribe's personal observations and the provider's statements to them.  ASSESSMENT & PLAN:   Assessment & Plan: History of ITP with multiple relapses.  The patient completed 4 cycles of weekly rituximab and treatment of her ITP in November 2022.  She was then completely weaned off of prednisone.    History of splenectomy, twice.  Chronic leukocytosis.  Tobacco abuse. She has now quit, but is vaping.  History of lumpectomy in July 2018 which was benign.  Osteopenia, stable to improved. Repeat bone density is scheduled through Dr. Jannette Fogo.  Elevated liver transaminases, improved. She states that Dr. Jannette Fogo has evaluated her with imaging and labs and she was found to have fatty liver. She has cut her Tylenol use back to 500 mg TID. We may need to eliminate Tylenol all together, but I doubt that this is really the cause of her abnormal liver function tests.   Dyspnea. She has chronic lung disease she states she uses her inhalers and nebulizers.    Hypotension and mild dehydration. I advised that she stop the Lasix and use it only as needed. She will also push fluids.   She continues to do fairly well, and her platelets remain normal, and so she will have repeat CBC in 1 month. Otherwise, we will plan to see her back in 2 months with CBC to continue monitoring her off of treatment. The patient understands the plans discussed today and is in agreement with them.  She knows to contact our office if she develops concerns prior to her next appointment.   I provided 20 minutes of face-to-face time during this encounter  and > 50% was spent counseling as documented under my assessment and plan.    [facility] [address] [place] [address] Dept: [phone] Dept Fax: [phone]   No orders of the defined types were placed in this encounter.     CHIEF COMPLAINT:  CC: ITP with new, bleeding and hematuria  Current Treatment: Observation   HISTORY OF PRESENT ILLNESS:  The patient Jean Kramer is a 71 year old female with a 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 She initially responded to that, but over the years has been on and off corticosteroids for relapsing ITP, and was also treated with danazol for a time.  Her ITP had been in remission for many years, but she does has had chronic leukocytosis.  She started smoking at age 2 and so she has been smoking approximately 1 pack per day for 49 years and continues to smoke despite numerous discussions about smoking cessation.   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  We recommended an ultrasound-guided biopsy which revealed fibroadenomatoid and fibrocystic changes, including cystic and micro papillary apocrine metaplasia, apocrine adenosis, stromal fibrosis, and foci of  pseudo angiomatous stromal hyperplasia and focal sclerosis.  It was recommended that she have excision of this area and that was performed in July by Dr. Melynda Ripple.  The final pathology revealed atrophic breast tissue with fibrocystic changes and foci of usual ductal hyperplasia but no intraductal papilloma, fibroadenoma, atypia or malignancy.  There was another lumpectomy sample which revealed micropapillary ductal hyperplasia, focal ectatic  ducts, and patchy peri ductal chronic inflammation.  The other area of lumpectomy had morphologic changes similar to the original biopsy.  We had been seeing her regularly, but then she was lost to follow-up after May 2019. She had a diagnostic right mammogram in April 2020 which revealed evolving fat necrosis.   She was admitted to [facility] in July 2022 due to developing an upper GI bleed from taking BC powders.  We had advised her to discontinue these previously.  She switched to Tylenol for her pain.  While in the hospital, her platelet count dropped into the teens, and she was given steroids and IVIG.   She had extensive bruising. She was in the hospital for a total of 15 days and her platelet count had gone up to 63,000 at the time of discharge.  She was referred back to [place].  She was given a prescription for prednisone at the time of discharge, but Dr. Hinton Rao advised her not to take this, as her platelet count was normal.  When we saw her in early August her platelet count was up to 168,000.  She had leukocytosis with white count was 14,000.  Her bruises had resolved and she was doing well.   She was then admitted to [facility] in August due to severe thrombocytopenia with a platelet count less than 5,000.  She presented to the hospital after she removed a scab from her knee, and the wound continued to ooze blood.  She received Nplate injection x2, as well as 2 units of PRBCs and 1 unit of platelets.  Upon discharge, her platelet count had improved to 17,000.  She was advised to discontinue aspirin 81 mg.  She once again had extensive bruising, but also noted shortness of breath with exertion, insomnia, and occasional dizziness and lightheadedness.  I saw her for hospital follow-up and she had resolution of the leukocytosis and her platelet count was up to 41,000.  Chemistries revealed worsening elevation of the liver transaminases with an SGOT of 117 and an SGPT of 90.  She states that she has  known fatty liver.  As her platelets remained low, she was started on prednisone 60 mg daily. We planned to monitor her CBC weekly and see her every 2 weeks.  Bilateral screening mammogram in August did not reveal any evidence of malignancy.  She initially had an excellent response to high-dose prednisone, but when we tapered her rapidly, she had recurrent severe thrombocytopenia.  We were unable to slowly taper the prednisone, so she went on to receive rituximab weekly for 4 weeks completed on her 17th with an excellent response.  She was rapidly tapered off of prednisone while receiving rituximab.  2 weeks ago prednisone was discontinued lately count remains normal at 151,000.   INTERVAL HISTORY:  Jean Kramer is here for routine follow up and states that she is doing fairly well. She reports headache which is not relieved with Tylenol. I had recommended that she decrease her dose of Tylenol last month due to significantly abnormal liver function tests. I advised that she try ibuprofen but be conservative  as this can thin the blood. She also reports generalized pruritis, and has been prescribed hydroxyzine 25 mg BID. However, she notes better improvement with Benadryl. She reports hair thinning and is taking vitamins. She continues Lasix 20 mg daily. She has stopped smoking cigarettes but has switched to a vape, and is trying to quit. Platelets remain normal at 205,000, and white count and hemoglobin are normal. Chemistries are unremarkable except for a BUN of 21 and elevated liver transaminases, which are slightly improved. I advised that she only take the Lasix as needed. Her  appetite is good, and she has gained 4 pounds since her last visit.  She denies fever, chills or other signs of infection.  She denies nausea, vomiting, bowel issues, or abdominal pain.  She denies sore throat, cough, dyspnea, or chest pain.  REVIEW OF SYSTEMS:  Review of Systems  Constitutional: Negative.  Negative for appetite change,  chills, fatigue, fever and unexpected weight change.  HENT:  Negative.    Eyes: Negative.   Respiratory:  Positive for shortness of breath (with exertion, chronic). Negative for chest tightness, cough, hemoptysis and wheezing.   Cardiovascular: Negative.  Negative for chest pain, leg swelling and palpitations.  Gastrointestinal:  Negative for abdominal distention, abdominal pain, blood in stool, constipation, diarrhea, nausea and vomiting.  Endocrine: Negative.   Genitourinary: Negative.  Negative for difficulty urinating, dysuria, frequency and hematuria.   Musculoskeletal:  Positive for gait problem (uses a cane). Negative for arthralgias, back pain, flank pain and myalgias.  Skin:  Positive for itching.  Neurological:  Positive for gait problem (uses a cane) and headaches. Negative for dizziness, extremity weakness, light-headedness, numbness, seizures and speech difficulty.  Hematological: Negative.   Psychiatric/Behavioral: Negative.  Negative for depression and sleep disturbance. The patient is not nervous/anxious.     VITALS:  Blood pressure (!) 98/50, pulse 88, temperature 98.1 F (36.7 C), temperature source Oral, resp. rate 20, height 5' 2.5" (1.588 m), weight 194 lb 14.4 oz (88.4 kg), SpO2 95 %.  Wt Readings from Last 3 Encounters:  09/15/21 194 lb 14.4 oz (88.4 kg)  08/15/21 190 lb 1.6 oz (86.2 kg)  07/16/21 194 lb 11.2 oz (88.3 kg)    Body mass index is 35.08 kg/m.  Performance status (ECOG): 1 - Symptomatic but completely ambulatory  PHYSICAL EXAM:  Physical Exam Constitutional:      General: She is not in acute distress.    Appearance: Normal appearance. She is normal weight.  HENT:     Head: Normocephalic and atraumatic.  Eyes:     General: No scleral icterus.    Extraocular Movements: Extraocular movements intact.     Conjunctiva/sclera: Conjunctivae normal.     Pupils: Pupils are equal, round, and reactive to light.  Cardiovascular:     Rate and Rhythm:  Normal rate and regular rhythm.     Pulses: Normal pulses.     Heart sounds: Normal heart sounds. No murmur heard.   No friction rub. No gallop.  Pulmonary:     Effort: Pulmonary effort is normal. No respiratory distress.     Breath sounds: Normal breath sounds.  Abdominal:     General: Bowel sounds are normal. There is no distension.     Palpations: Abdomen is soft. There is no hepatomegaly, splenomegaly or mass.     Tenderness: There is no abdominal tenderness.  Musculoskeletal:        General: Normal range of motion.     Cervical back: Normal range of motion  and neck supple.     Right lower leg: No edema.     Left lower leg: No edema.  Lymphadenopathy:     Cervical: No cervical adenopathy.  Skin:    General: Skin is warm and dry.  Neurological:     General: No focal deficit present.     Mental Status: She is alert and oriented to person, place, and time. Mental status is at baseline.  Psychiatric:        Mood and Affect: Mood normal.        Behavior: Behavior normal.        Thought Content: Thought content normal.        Judgment: Judgment normal.    LABS:   CBC Latest Ref Rng & Units 09/15/2021 08/15/2021 07/30/2021  WBC - 9.6 11.7 9.8  Hemoglobin 12.0 - 16.0 14.8 15.5 15.2  Hematocrit 36 - 46 44 45 45  Platelets 150 - 399 205 206 253   CMP Latest Ref Rng & Units 09/15/2021 08/15/2021 07/30/2021  Glucose 70 - 99 mg/dL - - -  BUN 4 - 21 21 27(A) 14  Creatinine 0.5 - 1.1 0.8 1.0 0.8  Sodium 137 - 147 137 138 139  Potassium 3.4 - 5.3 4.2 4.4 4.0  Chloride 99 - 108 101 103 103  CO2 13 - 22 27(A) 26(A) 26(A)  Calcium 8.7 - 10.7 9.5 9.7 10.0  Total Protein 6.5 - 8.1 g/dL - - -  Total Bilirubin 0.3 - 1.2 mg/dL - - -  Alkaline Phos 25 - 125 110 118 81  AST 13 - 35 144(A) 178(A) 193(A)  ALT 7 - 35 117(A) 131(A) 139(A)    Lab Results  Component Value Date   TIBC 330 02/08/2021   FERRITIN 137 02/08/2021   IRONPCTSAT 11 02/08/2021   No results found for: LDH  STUDIES:   No results found.    HISTORY:   Allergies: No Known Allergies  Current Medications: Current Outpatient Medications  Medication Sig Dispense Refill   acetaminophen (TYLENOL) 500 MG tablet Take 1,000 mg by mouth every 6 (six) hours as needed for mild pain.     alendronate (FOSAMAX) 70 MG tablet Take 70 mg by mouth once a week. Friday     ALPRAZolam (XANAX) 1 MG tablet Take 1 mg by mouth 2 (two) times daily as needed.     Budeson-Glycopyrrol-Formoterol (BREZTRI AEROSPHERE) 160-9-4.8 MCG/ACT AERO 2 puffs     busPIRone (BUSPAR) 15 MG tablet Take 15 mg by mouth 2 (two) times daily.     calcium-vitamin D (OSCAL WITH D) 500-200 MG-UNIT TABS tablet Take 1 tablet by mouth 2 (two) times daily with a meal.     furosemide (LASIX) 20 MG tablet Take 20 mg by mouth daily.     hydrOXYzine (ATARAX) 25 MG tablet Take 25 mg by mouth 2 (two) times daily as needed.     ipratropium-albuterol (DUONEB) 0.5-2.5 (3) MG/3ML SOLN Inhale 3 mLs into the lungs every 6 (six) hours as needed for shortness of breath.     levothyroxine (SYNTHROID) 25 MCG tablet Take 25 mcg by mouth every morning.     Omega-3 1000 MG CAPS Take 2 capsules by mouth daily.     ondansetron (ZOFRAN) 4 MG tablet Take 1 tablet (4 mg total) by mouth every 4 (four) hours as needed for nausea. 90 tablet 3   Oxycodone HCl 20 MG TABS 1 tablet as needed     pramipexole (MIRAPEX) 0.5 MG tablet Take 0.5  mg by mouth at bedtime.     prochlorperazine (COMPAZINE) 10 MG tablet Take 1 tablet (10 mg total) by mouth every 6 (six) hours as needed for nausea or vomiting. 90 tablet 3   QUEtiapine (SEROQUEL) 200 MG tablet Take 200 mg by mouth at bedtime.     rOPINIRole (REQUIP) 0.5 MG tablet Take 0.5 mg by mouth 3 (three) times daily.     No current facility-administered medications for this visit.    I, Rita Ohara, am acting as scribe for [name], MD  I have reviewed this report as typed by the medical scribe, and it is complete and  accurate.

## 2021-09-15 ENCOUNTER — Encounter: Payer: Self-pay | Admitting: Oncology

## 2021-09-15 ENCOUNTER — Other Ambulatory Visit: Payer: Self-pay

## 2021-09-15 ENCOUNTER — Inpatient Hospital Stay: Payer: Medicare Other

## 2021-09-15 ENCOUNTER — Inpatient Hospital Stay: Payer: Medicare Other | Attending: Oncology | Admitting: Oncology

## 2021-09-15 VITALS — BP 98/50 | HR 88 | Temp 98.1°F | Resp 20 | Ht 62.5 in | Wt 194.9 lb

## 2021-09-15 DIAGNOSIS — D693 Immune thrombocytopenic purpura: Secondary | ICD-10-CM

## 2021-09-15 LAB — CBC AND DIFFERENTIAL
HCT: 44 (ref 36–46)
Hemoglobin: 14.8 (ref 12.0–16.0)
Neutrophils Absolute: 6.24
Platelets: 205 (ref 150–399)
WBC: 9.6

## 2021-09-15 LAB — BASIC METABOLIC PANEL
BUN: 21 (ref 4–21)
CO2: 27 — AB (ref 13–22)
Chloride: 101 (ref 99–108)
Creatinine: 0.8 (ref 0.5–1.1)
Glucose: 113
Potassium: 4.2 (ref 3.4–5.3)
Sodium: 137 (ref 137–147)

## 2021-09-15 LAB — HEPATIC FUNCTION PANEL
ALT: 117 — AB (ref 7–35)
AST: 144 — AB (ref 13–35)
Alkaline Phosphatase: 110 (ref 25–125)
Bilirubin, Total: 0.4

## 2021-09-15 LAB — COMPREHENSIVE METABOLIC PANEL
Albumin: 3.9 (ref 3.5–5.0)
Calcium: 9.5 (ref 8.7–10.7)

## 2021-09-15 LAB — CBC: RBC: 4.34 (ref 3.87–5.11)

## 2021-10-13 ENCOUNTER — Other Ambulatory Visit: Payer: Self-pay

## 2021-10-13 ENCOUNTER — Inpatient Hospital Stay: Payer: Medicare Other | Attending: Oncology

## 2021-10-13 DIAGNOSIS — D693 Immune thrombocytopenic purpura: Secondary | ICD-10-CM

## 2021-10-13 LAB — BASIC METABOLIC PANEL
BUN: 15 (ref 4–21)
CO2: 29 — AB (ref 13–22)
Chloride: 105 (ref 99–108)
Creatinine: 0.7 (ref 0.5–1.1)
Glucose: 99
Potassium: 3.9 mEq/L (ref 3.5–5.1)
Sodium: 140 (ref 137–147)

## 2021-10-13 LAB — HEPATIC FUNCTION PANEL
ALT: 153 U/L — AB (ref 7–35)
AST: 191 — AB (ref 13–35)
Alkaline Phosphatase: 117 (ref 25–125)
Bilirubin, Total: 0.6

## 2021-10-13 LAB — CBC AND DIFFERENTIAL
HCT: 43 (ref 36–46)
Hemoglobin: 13.8 (ref 12.0–16.0)
Neutrophils Absolute: 5.92
Platelets: 214 10*3/uL (ref 150–400)
WBC: 9.7

## 2021-10-13 LAB — CBC: RBC: 4.27 (ref 3.87–5.11)

## 2021-10-13 LAB — COMPREHENSIVE METABOLIC PANEL
Albumin: 3.7 (ref 3.5–5.0)
Calcium: 9.8 (ref 8.7–10.7)

## 2021-11-13 ENCOUNTER — Encounter: Payer: Self-pay | Admitting: Hematology and Oncology

## 2021-11-13 ENCOUNTER — Inpatient Hospital Stay: Payer: Medicare Other | Attending: Oncology | Admitting: Hematology and Oncology

## 2021-11-13 ENCOUNTER — Inpatient Hospital Stay: Payer: Medicare Other

## 2021-11-13 DIAGNOSIS — R748 Abnormal levels of other serum enzymes: Secondary | ICD-10-CM | POA: Diagnosis not present

## 2021-11-13 DIAGNOSIS — R0602 Shortness of breath: Secondary | ICD-10-CM | POA: Diagnosis not present

## 2021-11-13 DIAGNOSIS — D693 Immune thrombocytopenic purpura: Secondary | ICD-10-CM | POA: Diagnosis not present

## 2021-11-13 LAB — BASIC METABOLIC PANEL
BUN: 20 (ref 4–21)
CO2: 24 — AB (ref 13–22)
Chloride: 101 (ref 99–108)
Creatinine: 0.7 (ref 0.5–1.1)
Glucose: 191
Potassium: 4 mEq/L (ref 3.5–5.1)
Sodium: 138 (ref 137–147)

## 2021-11-13 LAB — CBC AND DIFFERENTIAL
HCT: 43 (ref 36–46)
Hemoglobin: 14.1 (ref 12.0–16.0)
Neutrophils Absolute: 8.25
Platelets: 207 10*3/uL (ref 150–400)
WBC: 13.1

## 2021-11-13 LAB — COMPREHENSIVE METABOLIC PANEL
Albumin: 4.1 (ref 3.5–5.0)
Calcium: 9.8 (ref 8.7–10.7)

## 2021-11-13 LAB — HEPATIC FUNCTION PANEL
ALT: 114 U/L — AB (ref 7–35)
AST: 144 — AB (ref 13–35)
Alkaline Phosphatase: 130 — AB (ref 25–125)
Bilirubin, Total: 0.6

## 2021-11-13 LAB — CBC: RBC: 4.32 (ref 3.87–5.11)

## 2021-11-13 NOTE — Assessment & Plan Note (Signed)
Elevated liver transaminases. She states that Dr. Jannette Fogo has evaluated her with imaging and labs and she was found to have fatty liver.?These have improved today. She continues to follow with PCP. ?

## 2021-11-13 NOTE — Assessment & Plan Note (Signed)
Dyspnea. She has chronic lung disease she states she uses her inhalers and nebulizers. ? ?

## 2021-11-13 NOTE — Progress Notes (Signed)
Patient Care Team: [name], MD as PCP - General (Internal Medicine) [name], MD as Consulting Physician (Oncology)  Clinic Day:  11/13/2021  Referring physician: [name], MD  ASSESSMENT & PLAN:   Assessment & Plan: Chronic ITP (idiopathic thrombocytopenia) (HCC) History of ITP with multiple relapses.  The patient completed 4 cycles of weekly rituximab and treatment of her ITP in November 2022.  She was then completely weaned off of prednisone.  Platelets today are normal at 207. She is doing well. We will plan for follow up in 2 months.   Dyspnea Dyspnea. She has chronic lung disease she states she uses her inhalers and nebulizers.    Abnormal transaminases Elevated liver transaminases. She states that Dr. Jannette Fogo has evaluated her with imaging and labs and she was found to have fatty liver. These have improved today. She continues to follow with PCP.    The patient understands the plans discussed today and is in agreement with them.  She knows to contact our office if she develops concerns prior to her next appointment.    [name], NP  [facility] [address] [place] [address] Dept: [phone] Dept Fax: [phone]   Orders Placed This Encounter  Procedures   CBC and differential    This external order was created through the Results Console.   CBC    This external order was created through the Results Console.   Basic metabolic panel    This external order was created through the Results Console.   Comprehensive metabolic panel    This external order was created through the Results Console.   Hepatic function panel    This external order was created through the Results Console.      CHIEF COMPLAINT:  CC: A 71 year old female with history of chronic ITP here for 2 month evaluation  Current Treatment:  Surveillance  INTERVAL HISTORY:  Jean Kramer is here today  for repeat clinical assessment. She denies fevers or chills. She denies pain. Her appetite is good. Her weight has been stable.  I have reviewed the past medical history, past surgical history, social history and family history with the patient and they are unchanged from previous note.  ALLERGIES:  has No Known Allergies.  MEDICATIONS:  Current Outpatient Medications  Medication Sig Dispense Refill   lidocaine (LIDODERM) 5 % 1 patch remove after 12 hours     Oxycodone HCl 20 MG TABS 1 tablet as needed     acetaminophen (TYLENOL) 500 MG tablet Take 1,000 mg by mouth every 6 (six) hours as needed for mild pain.     alendronate (FOSAMAX) 70 MG tablet Take 70 mg by mouth once a week. Friday     ALPRAZolam (XANAX) 1 MG tablet Take 1 mg by mouth 2 (two) times daily as needed.     Budeson-Glycopyrrol-Formoterol (BREZTRI AEROSPHERE) 160-9-4.8 MCG/ACT AERO 2 puffs     busPIRone (BUSPAR) 15 MG tablet Take 15 mg by mouth 2 (two) times daily.     calcium-vitamin D (OSCAL WITH D) 500-200 MG-UNIT TABS tablet Take 1 tablet by mouth 2 (two) times daily with a meal.     furosemide (LASIX) 20 MG tablet Take 20 mg by mouth daily.     hydrOXYzine (ATARAX) 25 MG tablet Take 1 tablet by mouth 2 (two) times daily as needed.     ipratropium-albuterol (DUONEB) 0.5-2.5 (3) MG/3ML SOLN Inhale 3 mLs into the  lungs every 6 (six) hours as needed for shortness of breath.     levothyroxine (SYNTHROID) 25 MCG tablet Take 25 mcg by mouth every morning.     Omega-3 1000 MG CAPS Take 2 capsules by mouth daily.     ondansetron (ZOFRAN) 4 MG tablet Take 1 tablet (4 mg total) by mouth every 4 (four) hours as needed for nausea. 90 tablet 3   pramipexole (MIRAPEX) 0.5 MG tablet Take 0.5 mg by mouth at bedtime.     prochlorperazine (COMPAZINE) 10 MG tablet Take 1 tablet (10 mg total) by mouth every 6 (six) hours as needed for nausea or vomiting. 90 tablet 3   QUEtiapine (SEROQUEL) 200 MG tablet Take 200 mg by mouth at bedtime.      rOPINIRole (REQUIP) 0.5 MG tablet Take 0.5 mg by mouth 3 (three) times daily.     simvastatin (ZOCOR) 40 MG tablet Take 40 mg by mouth at bedtime.     No current facility-administered medications for this visit.    HISTORY OF PRESENT ILLNESS:   Oncology History   No history exists.      REVIEW OF SYSTEMS:   Constitutional: Denies fevers, chills or abnormal weight loss Eyes: Denies blurriness of vision Ears, nose, mouth, throat, and face: Denies mucositis or sore throat Respiratory: Denies cough, dyspnea or wheezes Cardiovascular: Denies palpitation, chest discomfort or lower extremity swelling Gastrointestinal:  Denies nausea, heartburn or change in bowel habits Skin: Denies abnormal skin rashes Lymphatics: Denies new lymphadenopathy or easy bruising Neurological:Denies numbness, tingling or new weaknesses Behavioral/Psych: Mood is stable, no new changes  All other systems were reviewed with the patient and are negative.   VITALS:  Blood pressure 113/67, pulse 85, temperature 98.7 F (37.1 C), temperature source Oral, resp. rate 20, height 5' 2.5" (1.588 m), weight 198 lb 9.6 oz (90.1 kg), SpO2 95 %.  Wt Readings from Last 3 Encounters:  11/13/21 198 lb 9.6 oz (90.1 kg)  09/15/21 194 lb 14.4 oz (88.4 kg)  08/15/21 190 lb 1.6 oz (86.2 kg)    Body mass index is 35.75 kg/m.  Performance status (ECOG): 1 - Symptomatic but completely ambulatory  PHYSICAL EXAM:   GENERAL:alert, no distress and comfortable SKIN: skin color, texture, turgor are normal, no rashes or significant lesions EYES: normal, Conjunctiva are pink and non-injected, sclera clear OROPHARYNX:no exudate, no erythema and lips, buccal mucosa, and tongue normal  NECK: supple, thyroid normal size, non-tender, without nodularity LYMPH:  no palpable lymphadenopathy in the cervical, axillary or inguinal LUNGS: clear to auscultation and percussion with normal breathing effort HEART: regular rate & rhythm and no  murmurs and no lower extremity edema ABDOMEN:abdomen soft, non-tender and normal bowel sounds Musculoskeletal:no cyanosis of digits and no clubbing  NEURO: alert & oriented x 3 with fluent speech, no focal motor/sensory deficits  LABORATORY DATA:  I have reviewed the data as listed    Component Value Date/Time   NA 138 11/13/2021 0000   K 4.0 11/13/2021 0000   CL 101 11/13/2021 0000   CO2 24 (A) 11/13/2021 0000   GLUCOSE 203 (H) 03/06/2021 0350   BUN 20 11/13/2021 0000   CREATININE 0.7 11/13/2021 0000   CREATININE 0.95 03/06/2021 0350   CALCIUM 9.8 11/13/2021 0000   PROT 6.8 03/06/2021 0350   ALBUMIN 4.1 11/13/2021 0000   AST 144 (A) 11/13/2021 0000   ALT 114 (A) 11/13/2021 0000   ALKPHOS 130 (A) 11/13/2021 0000   BILITOT 0.6 03/06/2021 0350   GFRNONAA >60  03/06/2021 0350    No results found for: SPEP, UPEP  Lab Results  Component Value Date   WBC 13.1 11/13/2021   NEUTROABS 8.25 11/13/2021   HGB 14.1 11/13/2021   HCT 43 11/13/2021   MCV 101 (A) 06/16/2021   PLT 207 11/13/2021      Chemistry      Component Value Date/Time   NA 138 11/13/2021 0000   K 4.0 11/13/2021 0000   CL 101 11/13/2021 0000   CO2 24 (A) 11/13/2021 0000   BUN 20 11/13/2021 0000   CREATININE 0.7 11/13/2021 0000   CREATININE 0.95 03/06/2021 0350   GLU 191 11/13/2021 0000      Component Value Date/Time   CALCIUM 9.8 11/13/2021 0000   ALKPHOS 130 (A) 11/13/2021 0000   AST 144 (A) 11/13/2021 0000   ALT 114 (A) 11/13/2021 0000   BILITOT 0.6 03/06/2021 0350       RADIOGRAPHIC STUDIES: I have personally reviewed the radiological images as listed and agreed with the findings in the report. No results found.

## 2021-11-13 NOTE — Assessment & Plan Note (Signed)
History of ITP with multiple relapses. ?The patient completed 4 cycles of weekly rituximab and treatment of her ITP in November 2022. ?She was then completely weaned off of prednisone. ?Platelets today are normal at 207. She is doing well. We will plan for follow up in 2 months.  ?

## 2021-11-25 ENCOUNTER — Other Ambulatory Visit: Payer: Self-pay

## 2021-11-25 ENCOUNTER — Emergency Department (HOSPITAL_COMMUNITY): Payer: Medicare Other

## 2021-11-25 ENCOUNTER — Observation Stay (HOSPITAL_COMMUNITY)
Admission: EM | Admit: 2021-11-25 | Discharge: 2021-11-26 | Disposition: A | Discharge status: Home / Self Care | Payer: Medicare Other | Attending: Internal Medicine | Admitting: Internal Medicine

## 2021-11-25 ENCOUNTER — Encounter (HOSPITAL_COMMUNITY): Payer: Self-pay

## 2021-11-25 DIAGNOSIS — R188 Other ascites: Secondary | ICD-10-CM | POA: Diagnosis not present

## 2021-11-25 DIAGNOSIS — H539 Unspecified visual disturbance: Secondary | ICD-10-CM

## 2021-11-25 DIAGNOSIS — I700 Atherosclerosis of aorta: Secondary | ICD-10-CM | POA: Diagnosis not present

## 2021-11-25 DIAGNOSIS — J329 Chronic sinusitis, unspecified: Secondary | ICD-10-CM | POA: Insufficient documentation

## 2021-11-25 DIAGNOSIS — R202 Paresthesia of skin: Secondary | ICD-10-CM | POA: Diagnosis present

## 2021-11-25 DIAGNOSIS — I9589 Other hypotension: Secondary | ICD-10-CM | POA: Diagnosis not present

## 2021-11-25 DIAGNOSIS — E861 Hypovolemia: Secondary | ICD-10-CM

## 2021-11-25 DIAGNOSIS — Z79891 Long term (current) use of opiate analgesic: Secondary | ICD-10-CM | POA: Insufficient documentation

## 2021-11-25 DIAGNOSIS — Z9081 Acquired absence of spleen: Secondary | ICD-10-CM | POA: Insufficient documentation

## 2021-11-25 DIAGNOSIS — H538 Other visual disturbances: Secondary | ICD-10-CM | POA: Insufficient documentation

## 2021-11-25 DIAGNOSIS — R2681 Unsteadiness on feet: Secondary | ICD-10-CM | POA: Insufficient documentation

## 2021-11-25 DIAGNOSIS — A419 Sepsis, unspecified organism: Secondary | ICD-10-CM | POA: Insufficient documentation

## 2021-11-25 DIAGNOSIS — R7989 Other specified abnormal findings of blood chemistry: Secondary | ICD-10-CM | POA: Diagnosis not present

## 2021-11-25 DIAGNOSIS — D693 Immune thrombocytopenic purpura: Secondary | ICD-10-CM | POA: Diagnosis not present

## 2021-11-25 DIAGNOSIS — F319 Bipolar disorder, unspecified: Secondary | ICD-10-CM | POA: Insufficient documentation

## 2021-11-25 DIAGNOSIS — K029 Dental caries, unspecified: Secondary | ICD-10-CM | POA: Insufficient documentation

## 2021-11-25 DIAGNOSIS — R200 Anesthesia of skin: Secondary | ICD-10-CM | POA: Diagnosis not present

## 2021-11-25 DIAGNOSIS — K5730 Diverticulosis of large intestine without perforation or abscess without bleeding: Secondary | ICD-10-CM | POA: Insufficient documentation

## 2021-11-25 DIAGNOSIS — K7460 Unspecified cirrhosis of liver: Secondary | ICD-10-CM | POA: Insufficient documentation

## 2021-11-25 DIAGNOSIS — R748 Abnormal levels of other serum enzymes: Secondary | ICD-10-CM

## 2021-11-25 DIAGNOSIS — Z79890 Hormone replacement therapy (postmenopausal): Secondary | ICD-10-CM | POA: Insufficient documentation

## 2021-11-25 DIAGNOSIS — R42 Dizziness and giddiness: Secondary | ICD-10-CM | POA: Insufficient documentation

## 2021-11-25 DIAGNOSIS — D72829 Elevated white blood cell count, unspecified: Secondary | ICD-10-CM | POA: Diagnosis present

## 2021-11-25 DIAGNOSIS — F17210 Nicotine dependence, cigarettes, uncomplicated: Secondary | ICD-10-CM | POA: Insufficient documentation

## 2021-11-25 DIAGNOSIS — K0889 Other specified disorders of teeth and supporting structures: Secondary | ICD-10-CM | POA: Diagnosis not present

## 2021-11-25 DIAGNOSIS — J449 Chronic obstructive pulmonary disease, unspecified: Secondary | ICD-10-CM | POA: Insufficient documentation

## 2021-11-25 DIAGNOSIS — E039 Hypothyroidism, unspecified: Secondary | ICD-10-CM | POA: Diagnosis not present

## 2021-11-25 DIAGNOSIS — I6782 Cerebral ischemia: Secondary | ICD-10-CM | POA: Diagnosis not present

## 2021-11-25 DIAGNOSIS — G8929 Other chronic pain: Secondary | ICD-10-CM | POA: Diagnosis not present

## 2021-11-25 DIAGNOSIS — M6281 Muscle weakness (generalized): Secondary | ICD-10-CM | POA: Insufficient documentation

## 2021-11-25 DIAGNOSIS — E860 Dehydration: Secondary | ICD-10-CM | POA: Diagnosis not present

## 2021-11-25 DIAGNOSIS — Z79899 Other long term (current) drug therapy: Secondary | ICD-10-CM | POA: Insufficient documentation

## 2021-11-25 DIAGNOSIS — R7401 Elevation of levels of liver transaminase levels: Secondary | ICD-10-CM | POA: Insufficient documentation

## 2021-11-25 DIAGNOSIS — K760 Fatty (change of) liver, not elsewhere classified: Secondary | ICD-10-CM | POA: Insufficient documentation

## 2021-11-25 DIAGNOSIS — I959 Hypotension, unspecified: Secondary | ICD-10-CM | POA: Diagnosis present

## 2021-11-25 DIAGNOSIS — R5383 Other fatigue: Secondary | ICD-10-CM | POA: Insufficient documentation

## 2021-11-25 DIAGNOSIS — E872 Acidosis, unspecified: Secondary | ICD-10-CM | POA: Insufficient documentation

## 2021-11-25 DIAGNOSIS — K047 Periapical abscess without sinus: Secondary | ICD-10-CM | POA: Insufficient documentation

## 2021-11-25 DIAGNOSIS — R531 Weakness: Secondary | ICD-10-CM | POA: Insufficient documentation

## 2021-11-25 DIAGNOSIS — R9431 Abnormal electrocardiogram [ECG] [EKG]: Secondary | ICD-10-CM | POA: Insufficient documentation

## 2021-11-25 DIAGNOSIS — Z8673 Personal history of transient ischemic attack (TIA), and cerebral infarction without residual deficits: Secondary | ICD-10-CM | POA: Insufficient documentation

## 2021-11-25 LAB — URINALYSIS, ROUTINE W REFLEX MICROSCOPIC
Bilirubin Urine: NEGATIVE
Glucose, UA: NEGATIVE mg/dL
Hgb urine dipstick: NEGATIVE
Ketones, ur: NEGATIVE mg/dL
Leukocytes,Ua: NEGATIVE
Nitrite: NEGATIVE
Protein, ur: NEGATIVE mg/dL
Specific Gravity, Urine: 1.02 (ref 1.005–1.030)
pH: 5 (ref 5.0–8.0)

## 2021-11-25 LAB — I-STAT CHEM 8, ED
BUN: 17 mg/dL (ref 8–23)
Calcium, Ion: 1.23 mmol/L (ref 1.15–1.40)
Chloride: 102 mmol/L (ref 98–111)
Creatinine, Ser: 0.8 mg/dL (ref 0.44–1.00)
Glucose, Bld: 123 mg/dL — ABNORMAL HIGH (ref 70–99)
HCT: 47 % — ABNORMAL HIGH (ref 36.0–46.0)
Hemoglobin: 16 g/dL — ABNORMAL HIGH (ref 12.0–15.0)
Potassium: 4.7 mmol/L (ref 3.5–5.1)
Sodium: 135 mmol/L (ref 135–145)
TCO2: 24 mmol/L (ref 22–32)

## 2021-11-25 LAB — COMPREHENSIVE METABOLIC PANEL
ALT: 170 U/L — ABNORMAL HIGH (ref 0–44)
AST: 200 U/L — ABNORMAL HIGH (ref 15–41)
Albumin: 3.3 g/dL — ABNORMAL LOW (ref 3.5–5.0)
Alkaline Phosphatase: 81 U/L (ref 38–126)
Anion gap: 9 (ref 5–15)
BUN: 14 mg/dL (ref 8–23)
CO2: 23 mmol/L (ref 22–32)
Calcium: 10.3 mg/dL (ref 8.9–10.3)
Chloride: 102 mmol/L (ref 98–111)
Creatinine, Ser: 0.88 mg/dL (ref 0.44–1.00)
GFR, Estimated: >60 mL/min (ref >60)
Glucose, Bld: 122 mg/dL — ABNORMAL HIGH (ref 70–99)
Potassium: 4.8 mmol/L (ref 3.5–5.1)
Sodium: 134 mmol/L — ABNORMAL LOW (ref 135–145)
Total Bilirubin: 0.8 mg/dL (ref 0.3–1.2)
Total Protein: 7.8 g/dL (ref 6.5–8.1)

## 2021-11-25 LAB — CBC
HCT: 43.1 % (ref 36.0–46.0)
Hemoglobin: 14.8 g/dL (ref 12.0–15.0)
MCH: 34.3 pg — ABNORMAL HIGH (ref 26.0–34.0)
MCHC: 34.3 g/dL (ref 30.0–36.0)
MCV: 99.8 fL (ref 80.0–100.0)
Platelets: 218 10*3/uL (ref 150–400)
RBC: 4.32 MIL/uL (ref 3.87–5.11)
RDW: 15 % (ref 11.5–15.5)
WBC: 14 10*3/uL — ABNORMAL HIGH (ref 4.0–10.5)
nRBC: 0 % (ref 0.0–0.2)

## 2021-11-25 LAB — PROTIME-INR
INR: 1.2 (ref 0.8–1.2)
INR: 1.3 — ABNORMAL HIGH (ref 0.8–1.2)
Prothrombin Time: 15.3 seconds — ABNORMAL HIGH (ref 11.4–15.2)
Prothrombin Time: 15.8 seconds — ABNORMAL HIGH (ref 11.4–15.2)

## 2021-11-25 LAB — PROCALCITONIN: Procalcitonin: <0.1 ng/mL

## 2021-11-25 LAB — CORTISOL: Cortisol, Plasma: 17 ug/dL

## 2021-11-25 LAB — LACTIC ACID, PLASMA
Lactic Acid, Venous: 2.1 mmol/L (ref 0.5–1.9)
Lactic Acid, Venous: 2.6 mmol/L (ref 0.5–1.9)

## 2021-11-25 LAB — CBG MONITORING, ED: Glucose-Capillary: 135 mg/dL — ABNORMAL HIGH (ref 70–99)

## 2021-11-25 LAB — TSH: TSH: 5.795 u[IU]/mL — ABNORMAL HIGH (ref 0.350–4.500)

## 2021-11-25 LAB — AMMONIA: Ammonia: 28 umol/L (ref 9–35)

## 2021-11-25 MED ORDER — SODIUM CHLORIDE 0.9 % IV SOLN: INTRAVENOUS | Status: DC

## 2021-11-25 MED ORDER — PIPERACILLIN-TAZOBACTAM 3.375 G IVPB 30 MIN
3.3750 g | Freq: Once | INTRAVENOUS | Status: AC
  Administered 2021-11-25: 3.375 g via INTRAVENOUS
  Filled 2021-11-25: qty 50

## 2021-11-25 MED ORDER — LEVOTHYROXINE SODIUM 25 MCG PO TABS
25.0000 ug | ORAL_TABLET | Freq: Every morning | ORAL | Status: DC
  Administered 2021-11-26: 25 ug via ORAL
  Filled 2021-11-25: qty 1

## 2021-11-25 MED ORDER — PRAMIPEXOLE DIHYDROCHLORIDE 0.25 MG PO TABS
0.5000 mg | ORAL_TABLET | Freq: Every day | ORAL | Status: DC
  Administered 2021-11-25: 0.5 mg via ORAL
  Filled 2021-11-25: qty 2

## 2021-11-25 MED ORDER — IPRATROPIUM-ALBUTEROL 0.5-2.5 (3) MG/3ML IN SOLN: 3.0000 mL | Freq: Four times a day (QID) | RESPIRATORY_TRACT | Status: DC | PRN

## 2021-11-25 MED ORDER — OXYCODONE HCL 5 MG PO TABS
20.0000 mg | ORAL_TABLET | Freq: Three times a day (TID) | ORAL | Status: DC | PRN
  Administered 2021-11-25 – 2021-11-26 (×2): 20 mg via ORAL
  Filled 2021-11-25 (×2): qty 4

## 2021-11-25 MED ORDER — FLUTICASONE FUROATE-VILANTEROL 200-25 MCG/ACT IN AEPB
1.0000 | INHALATION_SPRAY | Freq: Every day | RESPIRATORY_TRACT | Status: DC
Start: 2021-11-26 — End: 2021-11-26
  Administered 2021-11-26: 1 via RESPIRATORY_TRACT
  Filled 2021-11-25: qty 28

## 2021-11-25 MED ORDER — ONDANSETRON HCL 4 MG/2ML IJ SOLN: 4.0000 mg | Freq: Four times a day (QID) | INTRAMUSCULAR | Status: DC | PRN

## 2021-11-25 MED ORDER — MIDODRINE HCL 5 MG PO TABS
10.0000 mg | ORAL_TABLET | Freq: Three times a day (TID) | ORAL | Status: DC
Start: 2021-11-25 — End: 2021-11-26
  Administered 2021-11-25 – 2021-11-26 (×3): 10 mg via ORAL
  Filled 2021-11-25 (×3): qty 2

## 2021-11-25 MED ORDER — QUETIAPINE FUMARATE 200 MG PO TABS
200.0000 mg | ORAL_TABLET | Freq: Every day | ORAL | Status: DC
  Administered 2021-11-25: 200 mg via ORAL
  Filled 2021-11-25 (×2): qty 1

## 2021-11-25 MED ORDER — SODIUM CHLORIDE 0.9 % IV SOLN
3.0000 g | Freq: Four times a day (QID) | INTRAVENOUS | Status: DC
  Administered 2021-11-25 – 2021-11-26 (×2): 3 g via INTRAVENOUS
  Filled 2021-11-25 (×3): qty 8

## 2021-11-25 MED ORDER — FENTANYL CITRATE PF 50 MCG/ML IJ SOSY
25.0000 ug | PREFILLED_SYRINGE | Freq: Once | INTRAMUSCULAR | Status: AC
Start: 2021-11-25 — End: 2021-11-25
  Administered 2021-11-25: 25 ug via INTRAVENOUS
  Filled 2021-11-25: qty 1

## 2021-11-25 MED ORDER — SODIUM CHLORIDE 0.9 % IV BOLUS
1000.0000 mL | Freq: Once | INTRAVENOUS | Status: AC
  Administered 2021-11-25: 1000 mL via INTRAVENOUS

## 2021-11-25 MED ORDER — UMECLIDINIUM BROMIDE 62.5 MCG/ACT IN AEPB
1.0000 | INHALATION_SPRAY | Freq: Every day | RESPIRATORY_TRACT | Status: DC
  Administered 2021-11-26: 1 via RESPIRATORY_TRACT
  Filled 2021-11-25: qty 7

## 2021-11-25 MED ORDER — BUDESON-GLYCOPYRROL-FORMOTEROL 160-9-4.8 MCG/ACT IN AERO: 2.0000 | INHALATION_SPRAY | Freq: Two times a day (BID) | RESPIRATORY_TRACT | Status: DC

## 2021-11-25 MED ORDER — ENOXAPARIN SODIUM 40 MG/0.4ML IJ SOSY
40.0000 mg | PREFILLED_SYRINGE | Freq: Every day | INTRAMUSCULAR | Status: DC
  Administered 2021-11-25: 40 mg via SUBCUTANEOUS
  Filled 2021-11-25: qty 0.4

## 2021-11-25 MED ORDER — SODIUM CHLORIDE 0.9% FLUSH
3.0000 mL | Freq: Two times a day (BID) | INTRAVENOUS | Status: DC
  Administered 2021-11-25 – 2021-11-26 (×2): 3 mL via INTRAVENOUS

## 2021-11-25 MED ORDER — ONDANSETRON HCL 4 MG PO TABS: 4.0000 mg | ORAL_TABLET | Freq: Four times a day (QID) | ORAL | Status: DC | PRN

## 2021-11-25 MED ORDER — IOHEXOL 300 MG/ML  SOLN
100.0000 mL | Freq: Once | INTRAMUSCULAR | Status: AC | PRN
  Administered 2021-11-25: 100 mL via INTRAVENOUS

## 2021-11-25 NOTE — ED Provider Notes (Signed)
?[facility] ?Provider Note ? ? ?CSN: [id number] ?Arrival date & time: 11/25/21  1450 ? ?  ? ?History ? ?Chief Complaint  ?Patient presents with  ? Numbness  ? ? ?Jean Kramer is a 71 y.o. female here presenting with numbness and blurry vision.  Patient states that around noon today, she had some blurry vision and numbness.  Patient states that she also has diffuse weakness.  She states that she has something similar before and was noted to be hypotensive.  Patient does have some chronic dental pain.  Patient does have dentures.  Patient also takes oxycodone at baseline but did not take more than usual. ? ?The history is provided by the patient.  ? ?  ? ?Home Medications ?Prior to Admission medications   ?Medication Sig Start Date End Date Taking? Authorizing Provider  ?acetaminophen (TYLENOL) 500 MG tablet Take 1,000 mg by mouth every 6 (six) hours as needed for mild pain.    [provider]  ?alendronate (FOSAMAX) 70 MG tablet Take 70 mg by mouth once a week. Friday 12/06/20   [provider]  ?ALPRAZolam Duanne Moron) 1 MG tablet Take 1 mg by mouth 2 (two) times daily as needed. 09/08/21   [provider]  ?Budeson-Glycopyrrol-Formoterol (BREZTRI AEROSPHERE) 160-9-4.8 MCG/ACT AERO 2 puffs 04/21/21   [provider]  ?busPIRone (BUSPAR) 15 MG tablet Take 15 mg by mouth 2 (two) times daily. 12/03/20   [provider]  ?calcium-vitamin D (OSCAL WITH D) 500-200 MG-UNIT TABS tablet Take 1 tablet by mouth 2 (two) times daily with a meal.    [provider]  ?furosemide (LASIX) 20 MG tablet Take 20 mg by mouth daily. 01/16/21   [provider]  ?hydrOXYzine (ATARAX) 25 MG tablet Take 1 tablet by mouth 2 (two) times daily as needed.    [provider]  ?ipratropium-albuterol (DUONEB) 0.5-2.5 (3) MG/3ML SOLN Inhale 3 mLs into the lungs every 6 (six) hours as needed for shortness of breath.    [provider]   ?levothyroxine (SYNTHROID) 25 MCG tablet Take 25 mcg by mouth every morning. 11/20/20   [provider]  ?lidocaine (LIDODERM) 5 % 1 patch remove after 12 hours 11/05/21   [provider]  ?Omega-3 1000 MG CAPS Take 2 capsules by mouth daily.    [provider]  ?ondansetron (ZOFRAN) 4 MG tablet Take 1 tablet (4 mg total) by mouth every 4 (four) hours as needed for nausea. 05/12/21   [name], NP  ?Oxycodone HCl 20 MG TABS 1 tablet as needed 11/05/21   [provider]  ?pramipexole (MIRAPEX) 0.5 MG tablet Take 0.5 mg by mouth at bedtime. 12/06/20   [provider]  ?prochlorperazine (COMPAZINE) 10 MG tablet Take 1 tablet (10 mg total) by mouth every 6 (six) hours as needed for nausea or vomiting. 05/12/21   Dayton Scrape A, NP  ?QUEtiapine (SEROQUEL) 200 MG tablet Take 200 mg by mouth at bedtime. 02/01/21   [provider]  ?rOPINIRole (REQUIP) 0.5 MG tablet Take 0.5 mg by mouth 3 (three) times daily.    [provider]  ?simvastatin (ZOCOR) 40 MG tablet Take 40 mg by mouth at bedtime. 09/28/21   [provider]  ?   ? ?Allergies    ?Patient has no known allergies.   ? ?Review of Systems   ?Review of Systems  ?Neurological:  Positive for dizziness and weakness.  ?All other systems reviewed and are negative. ? ?  Physical Exam ?Updated Vital Signs ?BP (!) 80/52   Pulse 91   Temp 98.5 ?F (36.9 ?C) (Oral)   Resp 16   Ht '5\' 2"'$  (1.575 m)   Wt 89.8 kg   SpO2 98%   BMI 36.21 kg/m?  ?Physical Exam ?Vitals and nursing note reviewed.  ?Constitutional:   ?   Comments: Chronically ill, slightly dehydrated  ?HENT:  ?   Head: Normocephalic.  ?   Nose: Nose normal.  ?   Mouth/Throat:  ?   Comments: Poor dentition overall.  Patient has upper dentures.  Patient does have cavity of bilateral upper molars.  No obvious purulent discharge around the gums ?Eyes:  ?   Extraocular Movements: Extraocular movements intact.  ?   Pupils: Pupils are equal,  round, and reactive to light.  ?Cardiovascular:  ?   Rate and Rhythm: Normal rate and regular rhythm.  ?   Pulses: Normal pulses.  ?   Heart sounds: Normal heart sounds.  ?Pulmonary:  ?   Effort: Pulmonary effort is normal.  ?   Breath sounds: Normal breath sounds.  ?Abdominal:  ?   General: Abdomen is flat.  ?   Palpations: Abdomen is soft.  ?Musculoskeletal:     ?   General: Normal range of motion.  ?   Cervical back: Normal range of motion and neck supple.  ?Skin: ?   General: Skin is warm.  ?   Capillary Refill: Capillary refill takes less than 2 seconds.  ?Neurological:  ?   General: No focal deficit present.  ?   Mental Status: She is oriented to person, place, and time.  ?Psychiatric:     ?   Mood and Affect: Mood normal.     ?   Behavior: Behavior normal.  ? ? ?ED Results / Procedures / Treatments   ?Labs ?(all labs ordered are listed, but only abnormal results are displayed) ?Labs Reviewed  ?COMPREHENSIVE METABOLIC PANEL - Abnormal; Notable for the following components:  ?    Result Value  ? Sodium 134 (*)   ? Glucose, Bld 122 (*)   ? Albumin 3.3 (*)   ? AST 200 (*)   ? ALT 170 (*)   ? All other components within normal limits  ?CBC - Abnormal; Notable for the following components:  ? WBC 14.0 (*)   ? MCH 34.3 (*)   ? All other components within normal limits  ?PROTIME-INR - Abnormal; Notable for the following components:  ? Prothrombin Time 15.3 (*)   ? All other components within normal limits  ?CBG MONITORING, ED - Abnormal; Notable for the following components:  ? Glucose-Capillary 135 (*)   ? All other components within normal limits  ?I-STAT CHEM 8, ED - Abnormal; Notable for the following components:  ? Glucose, Bld 123 (*)   ? Hemoglobin 16.0 (*)   ? HCT 47.0 (*)   ? All other components within normal limits  ?URINE CULTURE  ?CULTURE, BLOOD (ROUTINE X 2)  ?CULTURE, BLOOD (ROUTINE X 2)  ?LACTIC ACID, PLASMA  ?LACTIC ACID, PLASMA  ?URINALYSIS, ROUTINE W REFLEX MICROSCOPIC  ?TSH  ? ? ?EKG ?EKG  Interpretation ? ?Date/Time:  Tuesday Nov 25 2021 15:14:26 EDT ?Ventricular Rate:  100 ?PR Interval:  154 ?QRS Duration: 66 ?QT Interval:  322 ?QTC Calculation: 415 ?R Axis:   48 ?Text Interpretation: Normal sinus rhythm Nonspecific ST and T wave abnormality Abnormal ECG When compared with ECG of 05-Feb-2021 13:33, PREVIOUS ECG IS PRESENT Confirmed by  Wandra Arthurs [phone]) on 11/25/2021 5:03:21 PM ? ?Radiology ?DG Chest 2 View ? ?Result Date: 11/25/2021 ?CLINICAL DATA:  Fatigue, shortness of breath, facial numbness EXAM: CHEST - 2 VIEW COMPARISON:  07/01/2021 FINDINGS: Normal heart size, mediastinal contours, and pulmonary vascularity. Atherosclerotic calcification aorta. Lungs clear. No pulmonary infiltrate, pleural effusion, or pneumothorax. Osseous structures demineralized. IMPRESSION: No acute abnormalities. Aortic Atherosclerosis (ICD10-I70.0). Electronically Signed   By: Lavonia Dana M.D.   On: 11/25/2021 16:11  ? ?CT HEAD WO CONTRAST (5MM) ? ?Result Date: 11/25/2021 ?CLINICAL DATA:  Headache and numbness EXAM: CT HEAD WITHOUT CONTRAST TECHNIQUE: Contiguous axial images were obtained from the base of the skull through the vertex without intravenous contrast. RADIATION DOSE REDUCTION: This exam was performed according to the departmental dose-optimization program which includes automated exposure control, adjustment of the mA and/or kV according to patient size and/or use of iterative reconstruction technique. COMPARISON:  CT head 02/06/2019 FINDINGS: Brain: Ventricle size and cerebral volume normal. Mild hypodensity in the white matter bilaterally. Negative for acute infarct, hemorrhage, mass. Vascular: Negative for hyperdense vessel Skull: Negative Sinuses/Orbits: Asymmetric mucosal edema left maxillary, ethmoid and frontal sinus with progression from the prior study. Bony thickening left maxillary sinus. Periapical lucency around left upper molar could be a cause for sinus infection on the left. Air-fluid level in  the sphenoid sinus is new. Mastoid clear bilaterally. Other: Partially calcified scalp lesions in the right parietal lobe and frontal parietal convexity unchanged. Probable Pilar cysts. IMPRESSION: No acute intracranial abno

## 2021-11-25 NOTE — ED Provider Triage Note (Signed)
Emergency Medicine Provider Triage Evaluation Note ? ?Jean Kramer , a 71 y.o. female  was evaluated in triage.  Pt complains of "concern for stroke" ? ?Between 12-1 face felt numb (both sides) and states she had some black spots in her vision (both eyes described as floaters).  ?She says she felt she had a somewhat unsteady gait as well.  ? ? 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 ?Review of Systems  ?Positive: Face numbness, blurry vision/floaters ?Negative: Fever, CP, limb weakness ? ?Physical Exam  ?BP (!) 117/59 (BP Location: Left Arm)   Pulse 91   Temp 98.5 ?F (36.9 ?C) (Oral)   Resp 18   Ht '5\' 2"'$  (1.575 m)   Wt 89.8 kg   SpO2 95%   BMI 36.21 kg/m?  ?Gen:   Awake, no distress  ?Resp:  Normal effort  ?MSK:   Moves extremities without difficulty  ?Other:   ? ?Alert and oriented to self, place, time and event.  ? ?Speech is fluent, clear without dysarthria or dysphasia.  ? ?Strength 5/5 in upper/lower extremities   ?Sensation intact in upper/lower extremities  ? ?Normal gait.  ?Negative Romberg. No pronator drift.  ?Normal finger-to-nose and feet tapping.  ?CN I not tested  ?CN II grossly intact visual fields bilaterally. Did not visualize posterior eye.  ?CN III, IV, VI PERRLA and EOMs intact bilaterally  ?CN V Intact sensation to sharp and light touch to the face  ?CN VII facial movements symmetric  ?CN VIII not tested  ?CN IX, X no uvula deviation, symmetric rise of soft palate  ?CN XI 5/5 SCM and trapezius strength bilaterally  ?CN XII Midline tongue protrusion, symmetric L/R movements   ? ?Medical Decision Making  ?Medically screening exam initiated at 3:41 PM.  Appropriate orders placed.  Crystalmarie Yasin Moye was informed that the remainder of the evaluation will be completed by another provider, this initial triage assessment does not replace that evaluation, and the importance of remaining in the ED until their evaluation is complete. ? ?Discussed with Ankit N who agrees with my plan not  to call code stroke.  ? ?Labs and CT head and DG chest. EKG ? ?  ?Tedd Sias, [place] ?11/25/21 1556 ? ?

## 2021-11-25 NOTE — H&P (Addendum)
?History and Physical  ? ? ?Kearia Yin Spackman DZH:[id number] DOB: [birth year] DOA: 11/25/2021 ? ?PCP: [name], MD  ? ?Patient coming from: Home  ? ?Chief Complaint: Lightheaded, facial numbness, blurred vision  ? ?HPI: Jean Kramer is a pleasant 71 y.o. female with medical history significant for ITP, COPD, bipolar disorder, hypothyroidism, and chronic pain, now presenting to the emergency department with lightheadedness, facial numbness, and blurred vision.  Patient reports that she woke in her usual state of health, did not eat or drink anything today, was visiting with her granddaughter and her great grandchildren, and became acutely lightheaded with numbness involving her bilateral face, and blurred vision.  Symptoms were worse when standing and better when sitting or lying down.  She reports history of similar spells when her blood pressure has been low.  She denies any fevers, chills, cough, abdominal pain, vomiting, diarrhea, chest pain, or palpitations.  She does not remember when or why she stopped taking midodrine. ? ?ED Course: Upon arrival to the ED, patient is found to be afebrile and saturating well on room air with systolic blood pressure as low as 73.  EKG features sinus rhythm and chest x-ray negative for acute cardiopulmonary disease.  CT head is negative for acute intracranial abnormality but notable for progressive periapical lucency around her left upper molar.  No acute findings noted on CT of the abdomen and pelvis though there are features suggestive of cirrhosis.  Blood work most notable for AST 200, ALT 170, WBC 14,000, and lactic acid 2.1.  Patient was given 3 L of saline, fentanyl, and Zosyn.  ED reviewed the case with dentist Dr. Haig Prophet who recommended outpatient management of the odontogenic infection. ? ?Review of Systems:  ?All other systems reviewed and apart from HPI, are negative. ? ?Past Medical History:  ?Diagnosis Date  ? Abnormal transaminases 03/21/2021  ? COPD  (chronic obstructive pulmonary disease) ([place])   ? 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 Osteopenia after menopause 03/21/2021  ? Stroke [place])   ? Tobacco abuse 03/21/2021  ? ? ?Past Surgical History:  ?Procedure Laterality Date  ? BREAST LUMPECTOMY Right 01/29/2017  ? for benign disease  ? spleenectomy    ? SPLENECTOMY    ? ? ?Social History:  ? reports that she has been smoking cigarettes. She has never used smokeless tobacco. No history on file for alcohol use and drug use. ? ?No Known Allergies ? ?History reviewed. No pertinent family history. ? ? ?Prior to Admission medications   ?Medication Sig Start Date End Date Taking? Authorizing Provider  ?levothyroxine (SYNTHROID) 25 MCG tablet Take 25 mcg by mouth every morning. 11/20/20  Yes [provider]  ?acetaminophen (TYLENOL) 500 MG tablet Take 1,000 mg by mouth every 6 (six) hours as needed for mild pain.    [provider]  ?alendronate (FOSAMAX) 70 MG tablet Take 70 mg by mouth once a week. Friday 12/06/20   [provider]  ?ALPRAZolam Duanne Moron) 1 MG tablet Take 1 mg by mouth 2 (two) times daily as needed for anxiety. 09/08/21   [provider]  ?Budeson-Glycopyrrol-Formoterol (BREZTRI AEROSPHERE) 160-9-4.8 MCG/ACT AERO 2 puffs 04/21/21   [provider]  ?busPIRone (BUSPAR) 15 MG tablet Take 15 mg by mouth 2 (two) times daily. 12/03/20   [provider]  ?calcium-vitamin D (OSCAL WITH D) 500-200 MG-UNIT TABS tablet Take 1 tablet by mouth 2 (two) times daily with a meal.    [provider]  ?  furosemide (LASIX) 20 MG tablet Take 20 mg by mouth daily. 01/16/21   [provider]  ?hydrOXYzine (ATARAX) 25 MG tablet Take 1 tablet by mouth 2 (two) times daily as needed for anxiety.    [provider]  ?ipratropium-albuterol (DUONEB) 0.5-2.5 (3) MG/3ML SOLN Inhale 3 mLs into the lungs every 6 (six) hours as needed for shortness of breath.    [provider]  ?lidocaine  (LIDODERM) 5 % 1 patch remove after 12 hours 11/05/21   [provider]  ?Omega-3 1000 MG CAPS Take 2 capsules by mouth daily.    [provider]  ?ondansetron (ZOFRAN) 4 MG tablet Take 1 tablet (4 mg total) by mouth every 4 (four) hours as needed for nausea. 05/12/21   [name], NP  ?Oxycodone HCl 20 MG TABS Take 20 mg by mouth every 8 (eight) hours as needed (pain). 11/05/21   [provider]  ?pramipexole (MIRAPEX) 0.5 MG tablet Take 0.5 mg by mouth at bedtime. 12/06/20   [provider]  ?prochlorperazine (COMPAZINE) 10 MG tablet Take 1 tablet (10 mg total) by mouth every 6 (six) hours as needed for nausea or vomiting. 05/12/21   Dayton Scrape A, NP  ?QUEtiapine (SEROQUEL) 200 MG tablet Take 200 mg by mouth at bedtime. 02/01/21   [provider]  ?rOPINIRole (REQUIP) 0.5 MG tablet Take 0.5 mg by mouth 3 (three) times daily.    [provider]  ?simvastatin (ZOCOR) 40 MG tablet Take 40 mg by mouth at bedtime. 09/28/21   [provider]  ? ? ?Physical Exam: ?Vitals:  ? 11/25/21 2100 11/25/21 2130 11/25/21 2200 11/25/21 2240  ?BP: 101/74 106/85 114/73 (!) 126/55  ?Pulse: 97 96 93 94  ?Resp: '16 19 17 13  '$ ?Temp:      ?TempSrc:      ?SpO2: 98% 98% 100% 97%  ?Weight:      ?Height:      ? ? ?Constitutional: NAD, calm  ?Eyes: PERTLA, lids and conjunctivae normal ?ENMT: Mucous membranes are moist. Posterior pharynx clear of any exudate or lesions.   ?Neck: supple, no masses  ?Respiratory: no wheezing, no crackles. No accessory muscle use.  ?Cardiovascular: S1 & S2 heard, regular rate and rhythm. No extremity edema.  ?Abdomen: No distension, no tenderness, soft. Bowel sounds active.  ?Musculoskeletal: no clubbing / cyanosis. No joint deformity upper and lower extremities.   ?Skin: no significant rashes, lesions, ulcers. Warm, dry, well-perfused. ?Neurologic: CN 2-12 grossly intact. Sensation intact. Strength 5/5 in all 4 limbs. Alert and oriented.   ?Psychiatric: Pleasant. Cooperative.  ? ? ?Labs and Imaging on Admission: I have personally reviewed following labs and imaging studies ? ?CBC: ?Recent Labs  ?Lab 11/25/21 ?1556 11/25/21 ?1623  ?WBC 14.0*  --   ?HGB 14.8 16.0*  ?HCT 43.1 47.0*  ?MCV 99.8  --   ?PLT 218  --   ? ?Basic Metabolic Panel: ?Recent Labs  ?Lab 11/25/21 ?1556 11/25/21 ?1623  ?NA 134* 135  ?K 4.8 4.7  ?CL 102 102  ?CO2 23  --   ?GLUCOSE 122* 123*  ?BUN 14 17  ?CREATININE 0.88 0.80  ?CALCIUM 10.3  --   ? ?GFR: ?Estimated Creatinine Clearance: 67.2 mL/min (by C-G formula based on SCr of 0.8 mg/dL). ?Liver Function Tests: ?Recent Labs  ?Lab 11/25/21 ?1556  ?AST 200*  ?ALT 170*  ?ALKPHOS 81  ?BILITOT 0.8  ?PROT 7.8  ?ALBUMIN 3.3*  ? ?No results for input(s): LIPASE, AMYLASE in the last  168 hours. ?No results for input(s): AMMONIA in the last 168 hours. ?Coagulation Profile: ?Recent Labs  ?Lab 11/25/21 ?1730  ?INR 1.2  ? ?Cardiac Enzymes: ?No results for input(s): CKTOTAL, CKMB, CKMBINDEX, TROPONINI in the last 168 hours. ?BNP (last 3 results) ?No results for input(s): PROBNP in the last 8760 hours. ?HbA1C: ?No results for input(s): HGBA1C in the last 72 hours. ?CBG: ?Recent Labs  ?Lab 11/25/21 ?1559  ?GLUCAP 135*  ? ?Lipid Profile: ?No results for input(s): CHOL, HDL, LDLCALC, TRIG, CHOLHDL, LDLDIRECT in the last 72 hours. ?Thyroid Function Tests: ?Recent Labs  ?  11/25/21 ?1730  ?TSH 5.795*  ? ?Anemia Panel: ?No results for input(s): VITAMINB12, FOLATE, FERRITIN, TIBC, IRON, RETICCTPCT in the last 72 hours. ?Urine analysis: ?   ?Component Value Date/Time  ? [place] YELLOW 11/25/2021 1930  ? APPEARANCEUR CLEAR 11/25/2021 1930  ? LABSPEC 1.020 11/25/2021 1930  ? PHURINE 5.0 11/25/2021 1930  ? [place] NEGATIVE 11/25/2021 1930  ? [place] NEGATIVE 11/25/2021 1930  ? [place] NEGATIVE 11/25/2021 1930  ? [place] NEGATIVE 11/25/2021 1930  ? [place] NEGATIVE 11/25/2021 1930  ? NITRITE NEGATIVE 11/25/2021 1930  ? LEUKOCYTESUR NEGATIVE 11/25/2021  1930  ? ?Sepsis Labs: ?'@LABRCNTIP'$ (procalcitonin:4,lacticidven:4) ?)No results found for this or any previous visit (from the past 240 hour(s)).  ? ?Radiological Exams on Admission: ?DG Chest 2 View ? ?Result Dat

## 2021-11-25 NOTE — Progress Notes (Addendum)
Pharmacy Antibiotic Note ? ?Jean Kramer is a 71 y.o. female admitted on 11/25/2021 with  odontogenic infection .  Pharmacy has been consulted for unasyn dosing. ? ?WBC 14, Scr 0.8, LA 2.6, afebrile ? ?Plan: ?Unasyn 3 g q6h IV  ?F/u LOT therapy, cultures, ability to narrow therapy ? ?Height: '5\' 2"'$  (157.5 cm) ?Weight: 89.8 kg (198 lb) ?IBW/kg (Calculated) : 50.1 ? ?Temp (24hrs), Avg:98.5 ?F (36.9 ?C), Min:98.5 ?F (36.9 ?C), Max:98.5 ?F (36.9 ?C) ? ?Recent Labs  ?Lab 11/25/21 ?1556 11/25/21 ?1623 11/25/21 ?1730 11/25/21 ?1949  ?WBC 14.0*  --   --   --   ?CREATININE 0.88 0.80  --   --   ?LATICACIDVEN  --   --  2.1* 2.6*  ?  ?Estimated Creatinine Clearance: 67.2 mL/min (by C-G formula based on SCr of 0.8 mg/dL).   ? ?No Known Allergies ? ?Antimicrobials this admission: ?5/9 zosyn x1 ?5/9 unasyn >>  ? ?Dose adjustments this admission: ?NA ? ?Microbiology results: ?5/9 BCx: sent ?5/9 UCx: sent  ? ?Thank you for allowing pharmacy to be a part of this patient?s care. ? ?Jean Kramer Jean Kramer ?11/25/2021 10:57 PM ? ? ?

## 2021-11-25 NOTE — ED Triage Notes (Signed)
Pt arrived POV from home c/o blurry vision and facial numbness, unsteady gait that started about a hr and half ago. Pt states her last known well was 12pm. Pt is also c/o of hypotension. Pt endorses leg pain and back pain as well. No other deficits noted.  ?

## 2021-11-26 DIAGNOSIS — R748 Abnormal levels of other serum enzymes: Secondary | ICD-10-CM | POA: Diagnosis not present

## 2021-11-26 DIAGNOSIS — F319 Bipolar disorder, unspecified: Secondary | ICD-10-CM | POA: Diagnosis not present

## 2021-11-26 DIAGNOSIS — K047 Periapical abscess without sinus: Secondary | ICD-10-CM | POA: Diagnosis not present

## 2021-11-26 DIAGNOSIS — I9589 Other hypotension: Secondary | ICD-10-CM | POA: Diagnosis not present

## 2021-11-26 LAB — URINE CULTURE: Culture: NO GROWTH

## 2021-11-26 LAB — COMPREHENSIVE METABOLIC PANEL
ALT: 131 U/L — ABNORMAL HIGH (ref 0–44)
AST: 138 U/L — ABNORMAL HIGH (ref 15–41)
Albumin: 2.7 g/dL — ABNORMAL LOW (ref 3.5–5.0)
Alkaline Phosphatase: 65 U/L (ref 38–126)
Anion gap: 9 (ref 5–15)
BUN: 12 mg/dL (ref 8–23)
CO2: 21 mmol/L — ABNORMAL LOW (ref 22–32)
Calcium: 8.7 mg/dL — ABNORMAL LOW (ref 8.9–10.3)
Chloride: 105 mmol/L (ref 98–111)
Creatinine, Ser: 0.82 mg/dL (ref 0.44–1.00)
GFR, Estimated: >60 mL/min (ref >60)
Glucose, Bld: 139 mg/dL — ABNORMAL HIGH (ref 70–99)
Potassium: 4 mmol/L (ref 3.5–5.1)
Sodium: 135 mmol/L (ref 135–145)
Total Bilirubin: 0.6 mg/dL (ref 0.3–1.2)
Total Protein: 6.4 g/dL — ABNORMAL LOW (ref 6.5–8.1)

## 2021-11-26 LAB — LACTIC ACID, PLASMA: Lactic Acid, Venous: 2.1 mmol/L (ref 0.5–1.9)

## 2021-11-26 LAB — CBC
HCT: 38.5 % (ref 36.0–46.0)
Hemoglobin: 13 g/dL (ref 12.0–15.0)
MCH: 33.9 pg (ref 26.0–34.0)
MCHC: 33.8 g/dL (ref 30.0–36.0)
MCV: 100.3 fL — ABNORMAL HIGH (ref 80.0–100.0)
Platelets: 211 10*3/uL (ref 150–400)
RBC: 3.84 MIL/uL — ABNORMAL LOW (ref 3.87–5.11)
RDW: 15.1 % (ref 11.5–15.5)
WBC: 11.8 10*3/uL — ABNORMAL HIGH (ref 4.0–10.5)
nRBC: 0 % (ref 0.0–0.2)

## 2021-11-26 LAB — T4, FREE: Free T4: 1.05 ng/dL (ref 0.61–1.12)

## 2021-11-26 LAB — MAGNESIUM: Magnesium: 1.7 mg/dL (ref 1.7–2.4)

## 2021-11-26 MED ORDER — AMOXICILLIN-POT CLAVULANATE 875-125 MG PO TABS
1.0000 | ORAL_TABLET | Freq: Two times a day (BID) | ORAL | Status: DC
  Administered 2021-11-26: 1 via ORAL
  Filled 2021-11-26: qty 1

## 2021-11-26 MED ORDER — OXYCODONE HCL 5 MG PO TABS: 10.0000 mg | ORAL_TABLET | ORAL | Status: DC | PRN

## 2021-11-26 MED ORDER — OYSTER SHELL CALCIUM/D3 500-5 MG-MCG PO TABS
1.0000 | ORAL_TABLET | Freq: Two times a day (BID) | ORAL | Status: DC
  Administered 2021-11-26: 1 via ORAL
  Filled 2021-11-26 (×2): qty 1

## 2021-11-26 MED ORDER — AMOXICILLIN-POT CLAVULANATE 875-125 MG PO TABS: 1.0000 | ORAL_TABLET | Freq: Two times a day (BID) | ORAL | 0 refills | Status: DC

## 2021-11-26 MED ORDER — ACETAMINOPHEN 325 MG PO TABS: 650.0000 mg | ORAL_TABLET | Freq: Four times a day (QID) | ORAL | Status: DC | PRN

## 2021-11-26 MED ORDER — ONDANSETRON HCL 4 MG PO TABS: 4.0000 mg | ORAL_TABLET | ORAL | Status: DC | PRN

## 2021-11-26 MED ORDER — MIDODRINE HCL 10 MG PO TABS: 10.0000 mg | ORAL_TABLET | Freq: Three times a day (TID) | ORAL | 3 refills | Status: AC

## 2021-11-26 MED ORDER — ACETAMINOPHEN 500 MG PO TABS: 1000.0000 mg | ORAL_TABLET | Freq: Four times a day (QID) | ORAL | Status: DC | PRN

## 2021-11-26 MED ORDER — SODIUM CHLORIDE 0.9 % IV BOLUS
500.0000 mL | Freq: Once | INTRAVENOUS | Status: AC
  Administered 2021-11-26: 500 mL via INTRAVENOUS

## 2021-11-26 MED ORDER — OXYCODONE HCL 20 MG PO TABS: 20.0000 mg | ORAL_TABLET | Freq: Three times a day (TID) | ORAL | Status: DC | PRN

## 2021-11-26 MED ORDER — BUSPIRONE HCL 10 MG PO TABS
15.0000 mg | ORAL_TABLET | Freq: Two times a day (BID) | ORAL | Status: DC
Start: 2021-11-26 — End: 2021-11-26
  Administered 2021-11-26: 15 mg via ORAL
  Filled 2021-11-26: qty 2

## 2021-11-26 MED ORDER — HYDROXYZINE HCL 25 MG PO TABS: 25.0000 mg | ORAL_TABLET | Freq: Two times a day (BID) | ORAL | Status: DC | PRN

## 2021-11-26 MED ORDER — OYSTER SHELL CALCIUM/D 500-200 MG-UNIT PO TABS: 1.0000 | ORAL_TABLET | Freq: Two times a day (BID) | ORAL | Status: DC

## 2021-11-26 MED ORDER — ALPRAZOLAM 0.25 MG PO TABS: 1.0000 mg | ORAL_TABLET | Freq: Two times a day (BID) | ORAL | Status: DC | PRN

## 2021-11-26 NOTE — ED Notes (Signed)
Walked patient to the bathroom patient did great patient is now back in bed on the monitor with family at bedside and call bell in reach  ?

## 2021-11-26 NOTE — ED Notes (Signed)
PT at bedside.

## 2021-11-26 NOTE — ED Notes (Signed)
RN notified by Admitting MD that due to the pt's history of hypotension that the notification criteria is that her MAP drops below 60 or pt is not alert & oriented with hypotension  ?

## 2021-11-26 NOTE — Care Management CC44 (Signed)
Condition Code 44 Documentation Completed ? ?Patient Details  ?Name: Jean Kramer ?MRN: [id number] ?Date of Birth: 10/15/1950 ? ? ?Condition Code 44 given:  Yes ?Patient signature on Condition Code 44 notice:  Yes ?Documentation of 2 MD's agreement:  Yes ?Code 44 added to claim:  Yes ? ? ? ?Hao Dion, LCSW ?11/26/2021, 3:47 PM ? ?

## 2021-11-26 NOTE — Discharge Summary (Signed)
?DISCHARGE SUMMARY ? ?Jean Kramer ? ?MR#: [id number] ? ?DOB:[birth year]  ?Date of Admission: 11/25/2021 ?Date of Discharge: 11/26/2021 ? ?Attending Physician:Daylani Deblois Hennie Duos, MD ? ?Patient's DJT:TSVXB, Rosalyn Charters, MD ? ?Consults: None ? ?Disposition: Discharge home ? ?Follow-up Appts: ? Follow-up Information   ? ? Hague, Rosalyn Charters, MD Follow up.   ?Specialty: Internal Medicine ?Contact information: ?[address] ?[place] [address] ?[phone] ? ? ?  ?  ? ?  ?  ? ?  ? ? ?Tests Needing Follow-up: ?-Assess blood pressure ?-Recheck LFTs and determine if it is safe to resume Zocor ? ?Discharge Diagnoses: ?Acute exacerbation of chronic hypotension - mild lactic acidosis ?Dental infection ?COPD ?Chronic transaminitis ?Bipolar disorder ?Hypothyroidism ?Chronic ITP status post splenectomy ? ?Initial presentation: ?71 year old with a history of ITP, COPD, bipolar disorder, hypothyroidism, and chronic pain who presented to the ED with lightheadedness facial numbness and blurred vision which occurred acutely the day of her presentation.  Her symptoms were noted to be much worse when standing and better when she would sit or even lie down.  She reported a history of similar spells when her blood pressure was low.  In the ED she was found to have a systolic blood pressure of 73.  CT head was negative for acute intracranial abnormality but did suggest a progressive periapical lucency around a left upper molar.  The ED team discussed the patient's care with the on-call dentist who recommended outpatient management of the odontogenic infection. ? ?Hospital Course: ?The patient's symptoms at presentation were consistent with her prior episodes of uncontrolled hypotension per her own history.  She reported to me that she ran out of midodrine and faced difficulty getting a refill from her primary care physician, and therefore had not taken it for quite some time.  After being found to be hypotensive in the ER she was volume  resuscitated and her midodrine was resumed.  Her diuretic was placed on hold.  Though her systolics continue to be modestly low and even dipped into the 70s at time, she symptomatically was dramatically improved.  Her random serum cortisol was normal.  Her blood pressure trend did appear to more consistently remain in the 90s.  She was able to ambulate around the unit without any difficulty and reported that she felt stable on her feet.  She denied orthostatic symptoms.  She was comfortable going home.  She was counseled on the absolute need to comply with the use of her midodrine.  She was counseled on the fact that Requip and Mirapex could worsen her orthostatic symptoms and her chronic hypotension and that she should avoid using these if at all possible.  She is provided with a new prescription for midodrine with 3 refills and instructed that she should see her primary care physician to get refills beyond that going forward.  She was also educated on the incidental finding of the tooth infection and prescribed a course of antibiotics to be completed after her discharge.  This was not problematic for her and she in fact denied any mouth pain whatsoever.  In the setting of mild transaminitis, likely related to her hypotension, she was advised to stop Zocor for now. ? ?Allergies as of 11/26/2021   ?No Known Allergies ?  ? ?  ?Medication List  ?  ? ?STOP taking these medications   ? ?furosemide 20 MG tablet ?Commonly known as: LASIX ?  ?lidocaine 5 % ?Commonly known as: LIDODERM ?  ?pramipexole 0.5 MG tablet ?Commonly  known as: MIRAPEX ?  ?prochlorperazine 10 MG tablet ?Commonly known as: COMPAZINE ?  ?rOPINIRole 0.5 MG tablet ?Commonly known as: REQUIP ?  ?simvastatin 40 MG tablet ?Commonly known as: ZOCOR ?  ? ?  ? ?TAKE these medications   ? ?acetaminophen 325 MG tablet ?Commonly known as: TYLENOL ?Take 2 tablets (650 mg total) by mouth every 6 (six) hours as needed for mild pain. ?What changed:  ?medication  strength ?how much to take ?  ?alendronate 70 MG tablet ?Commonly known as: FOSAMAX ?Take 70 mg by mouth every Friday. ?  ?amoxicillin-clavulanate 875-125 MG tablet ?Commonly known as: AUGMENTIN ?Take 1 tablet by mouth every 12 (twelve) hours. ?  ?Breztri Aerosphere 160-9-4.8 MCG/ACT Aero ?Generic drug: Budeson-Glycopyrrol-Formoterol ?2 puffs ?  ?busPIRone 15 MG tablet ?Commonly known as: BUSPAR ?Take 15 mg by mouth 2 (two) times daily. ?  ?CALCIUM + D3 PO ?Take 1 tablet by mouth daily. ?  ?HAIR/SKIN/NAILS PO ?Take 1 capsule by mouth daily. ?  ?hydrOXYzine 25 MG tablet ?Commonly known as: ATARAX ?Take 1 tablet by mouth 2 (two) times daily as needed for anxiety. ?  ?ibuprofen 200 MG tablet ?Commonly known as: ADVIL ?Take 600 mg by mouth every 6 (six) hours as needed for mild pain or headache. ?  ?ipratropium-albuterol 0.5-2.5 (3) MG/3ML Soln ?Commonly known as: DUONEB ?Inhale 3 mLs into the lungs every 6 (six) hours as needed for shortness of breath. ?  ?levothyroxine 25 MCG tablet ?Commonly known as: SYNTHROID ?Take 25 mcg by mouth at bedtime. ?  ?midodrine 10 MG tablet ?Commonly known as: PROAMATINE ?Take 1 tablet (10 mg total) by mouth 3 (three) times daily with meals. ?  ?multivitamin tablet ?Take 1 tablet by mouth daily. ?  ?OMEGA 3 PO ?Take 1 capsule by mouth daily. ?  ?ondansetron 4 MG tablet ?Commonly known as: ZOFRAN ?Take 1 tablet (4 mg total) by mouth every 4 (four) hours as needed for nausea. ?  ?Oxycodone HCl 20 MG Tabs ?Take 20 mg by mouth every 8 (eight) hours as needed (pain). ?  ?QUEtiapine 200 MG tablet ?Commonly known as: SEROQUEL ?Take 200 mg by mouth at bedtime. ?  ?VITAMIN A PO ?Take 1 tablet by mouth daily. ?  ? ?  ? ? ?Day of Discharge ?BP (!) 114/53   Pulse 77   Temp 97.9 ?F (36.6 ?C) (Oral)   Resp 11   Ht '5\' 2"'$  (1.575 m)   Wt 89.8 kg   SpO2 97%   BMI 36.21 kg/m?  ? ?Physical Exam: ?General: No acute respiratory distress ?Lungs: Clear to auscultation bilaterally without wheezes or  crackles ?Cardiovascular: Regular rate and rhythm without murmur gallop or rub normal S1 and S2 ?Abdomen: Nontender, nondistended, soft, bowel sounds positive, no rebound, no ascites, no appreciable mass ?Extremities: No significant cyanosis, clubbing, or edema bilateral lower extremities ? ?Basic Metabolic Panel: ?Recent Labs  ?Lab 11/25/21 ?1556 11/25/21 ?1623 11/26/21 ?0110  ?NA 134* 135 135  ?K 4.8 4.7 4.0  ?CL 102 102 105  ?CO2 23  --  21*  ?GLUCOSE 122* 123* 139*  ?BUN '14 17 12  '$ ?CREATININE 0.88 0.80 0.82  ?CALCIUM 10.3  --  8.7*  ?MG  --   --  1.7  ? ? ?Liver Function Tests: ?Recent Labs  ?Lab 11/25/21 ?1556 11/26/21 ?0110  ?AST 200* 138*  ?ALT 170* 131*  ?ALKPHOS 81 65  ?BILITOT 0.8 0.6  ?PROT 7.8 6.4*  ?ALBUMIN 3.3* 2.7*  ? ? ?Recent Labs  ?Lab 11/25/21 ?  2318  ?AMMONIA 28  ? ? ?Coags: ?Recent Labs  ?Lab 11/25/21 ?1730 11/25/21 ?2318  ?INR 1.2 1.3*  ? ? ?CBC: ?Recent Labs  ?Lab 11/25/21 ?1556 11/25/21 ?1623 11/26/21 ?0110  ?WBC 14.0*  --  11.8*  ?HGB 14.8 16.0* 13.0  ?HCT 43.1 47.0* 38.5  ?MCV 99.8  --  100.3*  ?PLT 218  --  211  ? ? ?CBG: ?Recent Labs  ?Lab 11/25/21 ?1559  ?GLUCAP 135*  ? ? ?Recent Results (from the past 240 hour(s))  ?Blood culture (routine x 2)     Status: None (Preliminary result)  ? Collection Time: 11/25/21  5:25 PM  ? Specimen: BLOOD  ?Result Value Ref Range Status  ? Specimen Description BLOOD BLOOD RIGHT FOREARM  Final  ? Special Requests   Final  ?  BOTTLES DRAWN AEROBIC AND ANAEROBIC Blood Culture results may not be optimal due to an inadequate volume of blood received in culture bottles  ? Culture   Final  ?  NO GROWTH < 24 HOURS ?Performed at [facility], [facility] [address], [place], [place] [address] ?  ? Report Status PENDING  Incomplete  ?Blood culture (routine x 2)     Status: None (Preliminary result)  ? Collection Time: 11/25/21  5:29 PM  ? Specimen: BLOOD  ?Result Value Ref Range Status  ? Specimen Description BLOOD BLOOD RIGHT HAND  Final  ? Special Requests   Final   ?  BOTTLES DRAWN AEROBIC AND ANAEROBIC Blood Culture results may not be optimal due to an inadequate volume of blood received in culture bottles  ? Culture   Final  ?  NO GROWTH < 24 HOURS ?Performed at [facility]

## 2021-11-26 NOTE — Progress Notes (Signed)
HOSPITAL MEDICINE OVERNIGHT EVENT NOTE   ? ?Notified by nursing that patient has had persisting hypotension throughout their stay in the emergency department. ? ?Patient is slightly lethargic but arousable and oriented.  Patient already received 3000 cc in normal saline boluses in addition to this I ordered an additional 500 cc normal saline bolus without any change in blood pressures.  Furthermore, patient had a cortisol performed during this hospitalization which was in the normal range. ? ?Review of previous hospitalization in mid 2022 revealed similar blood pressures.  Patient has chronic hypotension and is on longstanding use of midodrine.  Therefore in the absence of any clinical change we will continue to monitor blood pressures for now. ? ?Jean Emerald  MD ?[facility]  ? ? ? ? ? ? ? ? ? ? ? ?

## 2021-11-26 NOTE — Care Management Obs Status (Signed)
MEDICARE OBSERVATION STATUS NOTIFICATION ? ? ?Patient Details  ?Name: Jean Kramer ?MRN: [id number] ?Date of Birth: 01/23/1951 ? ? ?Medicare Observation Status Notification Given:    ?Yes ? ? ?Rinaldo Macqueen, LCSW ?11/26/2021, 3:50 PM ?

## 2021-11-26 NOTE — ED Notes (Signed)
Breakfast orders placed 

## 2021-11-26 NOTE — Progress Notes (Signed)
?   11/26/21 1546  ?Obs Status Letter  ?Condition Code 44 Given: Yes  ?Patient signature on CC44 notice: Yes  ?Documentation of 2 MD's agreement CC44: Yes  ?Code 44 added to claim: Yes  ? ? ?

## 2021-11-26 NOTE — ED Notes (Signed)
RN notified MD that pt is requesting diff scheduling of pain meds. Awaiting response  ?

## 2021-11-26 NOTE — Evaluation (Signed)
Physical Therapy Evaluation ?Patient Details ?Name: Jean Kramer ?MRN: [id number] ?DOB: [birth year] ?Today's Date: 11/26/2021 ? ?History of Present Illness ? Pt is a 71 y/o female admitted secondary to facial numbness and blurry vision. Found to be hypotensive. PMH includes COPD, CVA and tobacco use.  ?Clinical Impression ? Pt admitted secondary to problem above with deficits below. Pt requiring min guard A for mobility tasks. Reporting back and LE pain, however, did not seem to limit mobility. VSS throughout. Anticipate pt will progress well and will not require follow up PT. Will continue to follow acutely.    ?   ? ?Recommendations for follow up therapy are one component of a multi-disciplinary discharge planning process, led by the attending physician.  Recommendations may be updated based on patient status, additional functional criteria and insurance authorization. ? ?Follow Up Recommendations No PT follow up ? ?  ?Assistance Recommended at Discharge Intermittent Supervision/Assistance  ?Patient can return home with the following ? Assistance with cooking/housework ? ?  ?Equipment Recommendations None recommended by PT  ?Recommendations for Other Services ?    ?  ?Functional Status Assessment Patient has had a recent decline in their functional status and demonstrates the ability to make significant improvements in function in a reasonable and predictable amount of time.  ? ?  ?Precautions / Restrictions Precautions ?Precautions: None ?Restrictions ?Weight Bearing Restrictions: No  ? ?  ? ?Mobility ? Bed Mobility ?Overal bed mobility: Modified Independent ?  ?  ?  ?  ?  ?  ?  ?  ? ?Transfers ?Overall transfer level: Needs assistance ?Equipment used: None ?Transfers: Sit to/from Stand ?Sit to Stand: Min guard ?  ?  ?  ?  ?  ?General transfer comment: Min guard for safety to stand from higher stretcher height. ?  ? ?Ambulation/Gait ?Ambulation/Gait assistance: Min guard ?Gait Distance (Feet): 150  Feet ?Assistive device: 1 person hand held assist ?Gait Pattern/deviations: Step-through pattern, Decreased stride length ?Gait velocity: Decreased ?  ?  ?General Gait Details: Min guard for safety. No overt LOB and pt asymptomatic throughout. ? ?Stairs ?  ?  ?  ?  ?  ? ?Wheelchair Mobility ?  ? ?Modified Rankin (Stroke Patients Only) ?  ? ?  ? ?Balance Overall balance assessment: Mild deficits observed, not formally tested ?  ?  ?  ?  ?  ?  ?  ?  ?  ?  ?  ?  ?  ?  ?  ?  ?  ?  ?   ? ? ? ?Pertinent Vitals/Pain Pain Assessment ?Pain Assessment: Faces ?Faces Pain Scale: Hurts little more ?Pain Location: back ?Pain Descriptors / Indicators: Grimacing, Guarding ?Pain Intervention(s): Limited activity within patient's tolerance, Monitored during session, Repositioned  ? ? ?Home Living Family/patient expects to be discharged to:: Private residence ?Living Arrangements: Alone ?Available Help at Discharge: Family ?Type of Home: House ?Home Access: Ramped entrance ?  ?  ?  ?Home Layout: One level ?Home Equipment: Conservation officer, nature (2 wheels);Rollator (4 wheels);Cane - single point ?   ?  ?Prior Function Prior Level of Function : Independent/Modified Independent ?  ?  ?  ?  ?  ?  ?Mobility Comments: Uses cane vs rollator ?  ?  ? ? ?Hand Dominance  ?   ? ?  ?Extremity/Trunk Assessment  ? Upper Extremity Assessment ?Upper Extremity Assessment: Overall WFL for tasks assessed ?  ? ?Lower Extremity Assessment ?Lower Extremity Assessment: Generalized weakness ?  ? ?Cervical / Trunk Assessment ?  Cervical / Trunk Assessment: Normal  ?Communication  ? Communication: No difficulties  ?Cognition Arousal/Alertness: Awake/alert ?Behavior During Therapy: [facility] for tasks assessed/performed ?Overall Cognitive Status: Within Functional Limits for tasks assessed ?  ?  ?  ?  ?  ?  ?  ?  ?  ?  ?  ?  ?  ?  ?  ?  ?  ?  ?  ? ?  ?General Comments General comments (skin integrity, edema, etc.): VSS throughout ? ?  ?Exercises    ? ?Assessment/Plan  ?  ?PT  Assessment Patient needs continued PT services  ?PT Problem List Decreased strength;Decreased activity tolerance;Decreased balance;Decreased mobility ? ?   ?  ?PT Treatment Interventions DME instruction;Gait training;Functional mobility training;Therapeutic activities;Therapeutic exercise;Balance training;Patient/family education   ? ?PT Goals (Current goals can be found in the Care Plan section)  ?Acute Rehab PT Goals ?Patient Stated Goal: to go home ?PT Goal Formulation: With patient ?Time For Goal Achievement: 12/10/21 ?Potential to Achieve Goals: Good ? ?  ?Frequency Min 2X/week ?  ? ? ?Co-evaluation   ?  ?  ?  ?  ? ? ?  ?AM-PAC PT "6 Clicks" Mobility  ?Outcome Measure Help needed turning from your back to your side while in a flat bed without using bedrails?: None ?Help needed moving from lying on your back to sitting on the side of a flat bed without using bedrails?: None ?Help needed moving to and from a bed to a chair (including a wheelchair)?: A Little ?Help needed standing up from a chair using your arms (e.g., wheelchair or bedside chair)?: A Little ?Help needed to walk in hospital room?: A Little ?Help needed climbing 3-5 steps with a railing? : A Little ?6 Click Score: 20 ? ?  ?End of Session   ?Activity Tolerance: Patient tolerated treatment well ?Patient left: in chair;with call bell/phone within reach (in recliner in ED) ?Nurse Communication: Mobility status ?PT Visit Diagnosis: Unsteadiness on feet (R26.81);Muscle weakness (generalized) (M62.81) ?  ? ?Time: 3149-7026 ?PT Time Calculation (min) (ACUTE ONLY): 16 min ? ? ?Charges:   PT Evaluation ?$PT Eval Low Complexity: 1 Low ?  ?  ?   ? ? ?Reuel Derby, PT, DPT  ?Acute Rehabilitation Services  ?Pager: [phone] ?Office: [phone] ? ? ?[place] ?11/26/2021, 3:37 PM ?

## 2021-11-26 NOTE — ED Notes (Addendum)
Admitting notified of Pt's BP, Pt A&Ox4 but slightly lethargic, radial pulse +2 and pt urinating frequently . Pt placed in trendelenburg and BP assessed on multiple sites   ?

## 2021-11-30 LAB — CULTURE, BLOOD (ROUTINE X 2)
Culture: NO GROWTH
Culture: NO GROWTH

## 2021-12-04 ENCOUNTER — Encounter: Payer: Self-pay | Admitting: Gastroenterology

## 2021-12-30 ENCOUNTER — Ambulatory Visit: Payer: 59 | Admitting: Gastroenterology

## 2022-01-09 ENCOUNTER — Encounter: Payer: Self-pay | Admitting: *Deleted

## 2022-01-14 ENCOUNTER — Encounter: Payer: Self-pay | Admitting: Hematology and Oncology

## 2022-01-14 ENCOUNTER — Inpatient Hospital Stay: Payer: Medicare Other | Attending: Oncology | Admitting: Hematology and Oncology

## 2022-01-14 ENCOUNTER — Inpatient Hospital Stay: Payer: Medicare Other

## 2022-01-14 ENCOUNTER — Telehealth: Payer: Self-pay | Admitting: Hematology and Oncology

## 2022-01-14 DIAGNOSIS — D693 Immune thrombocytopenic purpura: Secondary | ICD-10-CM

## 2022-01-14 LAB — BASIC METABOLIC PANEL
BUN: 22 — AB (ref 4–21)
CO2: 25 — AB (ref 13–22)
Chloride: 99 (ref 99–108)
Creatinine: 0.8 (ref 0.5–1.1)
Glucose: 116
Potassium: 4.4 mEq/L (ref 3.5–5.1)
Sodium: 134 — AB (ref 137–147)

## 2022-01-14 LAB — CBC: RBC: 4.05 (ref 3.87–5.11)

## 2022-01-14 LAB — CBC AND DIFFERENTIAL
HCT: 42 (ref 36–46)
Hemoglobin: 14 (ref 12.0–16.0)
Neutrophils Absolute: 7.62
Platelets: 174 10*3/uL (ref 150–400)
WBC: 11.9

## 2022-01-14 LAB — COMPREHENSIVE METABOLIC PANEL
Albumin: 4.2 (ref 3.5–5.0)
Calcium: 10.6 (ref 8.7–10.7)

## 2022-01-14 LAB — HEPATIC FUNCTION PANEL
ALT: 233 U/L — AB (ref 7–35)
AST: 296 — AB (ref 13–35)
Alkaline Phosphatase: 128 — AB (ref 25–125)
Bilirubin, Total: 0.7

## 2022-01-14 NOTE — Telephone Encounter (Signed)
Per 01/14/22 los next appt scheduled and confirmed with patient 

## 2022-01-14 NOTE — Progress Notes (Signed)
Patient Care Team: [name], MD as PCP - General (Internal Medicine) [name], MD as Consulting Physician (Oncology)  Clinic Day:  01/14/2022  Referring physician: [name], MD  ASSESSMENT & PLAN:   Assessment & Plan: Acute ITP ([place]) History of ITP with multiple relapses.  The patient completed 4 cycles of weekly rituximab and treatment of her ITP in November 2022. She was then completely weaned off of prednisone.  Platelets today are normal at 174. She is doing well. We will plan for follow up in 3 months.    The patient understands the plans discussed today and is in agreement with them.  She knows to contact our office if she develops concerns prior to her next appointment.    [name], NP  [facility] [address] [place] [address] Dept: [phone] Dept Fax: [phone]   Orders Placed This Encounter  Procedures   CBC and differential    This external order was created through the Results Console.   CBC    This external order was created through the Results Console.   Basic metabolic panel    This external order was created through the Results Console.   Comprehensive metabolic panel    This external order was created through the Results Console.   Hepatic function panel    This external order was created through the Results Console.      CHIEF COMPLAINT:  CC: A 71 year old female with history of chronic ITP here for 2 month evaluation  Current Treatment:  Surveillance  INTERVAL HISTORY:  Hollin is here today for repeat clinical assessment. She denies fevers or chills. She denies pain. Her appetite is good. Her weight has been stable.  I have reviewed the past medical history, past surgical history, social history and family history with the patient and they are unchanged from previous note.  ALLERGIES:  has No Known Allergies.  MEDICATIONS:  Current  Outpatient Medications  Medication Sig Dispense Refill   levothyroxine (SYNTHROID) 50 MCG tablet 1 tablet in the morning on an empty stomach     Oxycodone HCl 20 MG TABS 1 tablet as needed     alendronate (FOSAMAX) 70 MG tablet Take 70 mg by mouth every Friday.     Biotin w/ Vitamins C & E (HAIR/SKIN/NAILS PO) Take 1 capsule by mouth daily.     Budeson-Glycopyrrol-Formoterol (BREZTRI AEROSPHERE) 160-9-4.8 MCG/ACT AERO 2 puffs     busPIRone (BUSPAR) 15 MG tablet Take 15 mg by mouth 2 (two) times daily.     Calcium Carb-Cholecalciferol (CALCIUM + D3 PO) Take 1 tablet by mouth daily.     furosemide (LASIX) 20 MG tablet Take 20 mg by mouth daily.     hydrOXYzine (ATARAX) 25 MG tablet Take 1 tablet by mouth 2 (two) times daily as needed for anxiety.     ibuprofen (ADVIL) 200 MG tablet Take 600 mg by mouth every 6 (six) hours as needed for mild pain or headache.     ipratropium-albuterol (DUONEB) 0.5-2.5 (3) MG/3ML SOLN Inhale 3 mLs into the lungs every 6 (six) hours as needed for shortness of breath.     midodrine (PROAMATINE) 10 MG tablet Take 1 tablet (10 mg total) by mouth 3 (three) times daily with meals. 90 tablet 3   Multiple Vitamin (MULTIVITAMIN) tablet Take 1 tablet by mouth daily.     Omega-3 Fatty Acids (OMEGA 3 PO) Take 1 capsule by  mouth daily.     ondansetron (ZOFRAN) 4 MG tablet Take 1 tablet (4 mg total) by mouth every 4 (four) hours as needed for nausea. 90 tablet 3   pramipexole (MIRAPEX) 0.5 MG tablet Take 0.5 mg by mouth at bedtime.     QUEtiapine (SEROQUEL) 200 MG tablet Take 200 mg by mouth at bedtime.     simvastatin (ZOCOR) 40 MG tablet Take 40 mg by mouth at bedtime.     VITAMIN A PO Take 1 tablet by mouth daily.     No current facility-administered medications for this visit.    HISTORY OF PRESENT ILLNESS:   Oncology History   No history exists.      REVIEW OF SYSTEMS:   Constitutional: Denies fevers, chills or abnormal weight loss Eyes: Denies blurriness of  vision Ears, nose, mouth, throat, and face: Denies mucositis or sore throat Respiratory: Denies cough, dyspnea or wheezes Cardiovascular: Denies palpitation, chest discomfort or lower extremity swelling Gastrointestinal:  Denies nausea, heartburn or change in bowel habits Skin: Denies abnormal skin rashes Lymphatics: Denies new lymphadenopathy or easy bruising Neurological:Denies numbness, tingling or new weaknesses Behavioral/Psych: Mood is stable, no new changes  All other systems were reviewed with the patient and are negative.   VITALS:  Blood pressure 132/61, pulse 88, temperature 98.1 F (36.7 C), temperature source Oral, resp. rate 20, height '5\' 2"'$  (1.575 m), weight 199 lb (90.3 kg), SpO2 93 %.  Wt Readings from Last 3 Encounters:  01/14/22 199 lb (90.3 kg)  11/25/21 198 lb (89.8 kg)  11/13/21 198 lb 9.6 oz (90.1 kg)    Body mass index is 36.4 kg/m.  Performance status (ECOG): 1 - Symptomatic but completely ambulatory  PHYSICAL EXAM:   GENERAL:alert, no distress and comfortable SKIN: skin color, texture, turgor are normal, no rashes or significant lesions EYES: normal, Conjunctiva are pink and non-injected, sclera clear OROPHARYNX:no exudate, no erythema and lips, buccal mucosa, and tongue normal  NECK: supple, thyroid normal size, non-tender, without nodularity LYMPH:  no palpable lymphadenopathy in the cervical, axillary or inguinal LUNGS: clear to auscultation and percussion with normal breathing effort HEART: regular rate & rhythm and no murmurs and no lower extremity edema ABDOMEN:abdomen soft, non-tender and normal bowel sounds Musculoskeletal:no cyanosis of digits and no clubbing  NEURO: alert & oriented x 3 with fluent speech, no focal motor/sensory deficits  LABORATORY DATA:  I have reviewed the data as listed    Component Value Date/Time   NA 134 (A) 01/14/2022 0000   K 4.4 01/14/2022 0000   CL 99 01/14/2022 0000   CO2 25 (A) 01/14/2022 0000   GLUCOSE  139 (H) 11/26/2021 0110   BUN 22 (A) 01/14/2022 0000   CREATININE 0.8 01/14/2022 0000   CREATININE 0.82 11/26/2021 0110   CALCIUM 10.6 01/14/2022 0000   PROT 6.4 (L) 11/26/2021 0110   ALBUMIN 4.2 01/14/2022 0000   AST 296 (A) 01/14/2022 0000   ALT 233 (A) 01/14/2022 0000   ALKPHOS 128 (A) 01/14/2022 0000   BILITOT 0.6 11/26/2021 0110   GFRNONAA >60 11/26/2021 0110    No results found for: "SPEP", "UPEP"  Lab Results  Component Value Date   WBC 11.9 01/14/2022   NEUTROABS 7.62 01/14/2022   HGB 14.0 01/14/2022   HCT 42 01/14/2022   MCV 100.3 (H) 11/26/2021   PLT 174 01/14/2022      Chemistry      Component Value Date/Time   NA 134 (A) 01/14/2022 0000   K 4.4 01/14/2022  0000   CL 99 01/14/2022 0000   CO2 25 (A) 01/14/2022 0000   BUN 22 (A) 01/14/2022 0000   CREATININE 0.8 01/14/2022 0000   CREATININE 0.82 11/26/2021 0110   GLU 116 01/14/2022 0000      Component Value Date/Time   CALCIUM 10.6 01/14/2022 0000   ALKPHOS 128 (A) 01/14/2022 0000   AST 296 (A) 01/14/2022 0000   ALT 233 (A) 01/14/2022 0000   BILITOT 0.6 11/26/2021 0110       RADIOGRAPHIC STUDIES: I have personally reviewed the radiological images as listed and agreed with the findings in the report. No results found.

## 2022-01-14 NOTE — Assessment & Plan Note (Signed)
History of ITP with multiple relapses. The patient completed 4 cycles of weekly rituximab and treatment of her ITP in November 2022. She was then completely weaned off of prednisone. Platelets today are normal at 174. She is doing well. We will plan for follow up in 3 months.

## 2022-01-16 ENCOUNTER — Ambulatory Visit (INDEPENDENT_AMBULATORY_CARE_PROVIDER_SITE_OTHER): Payer: Medicare Other | Admitting: Gastroenterology

## 2022-01-16 ENCOUNTER — Other Ambulatory Visit (INDEPENDENT_AMBULATORY_CARE_PROVIDER_SITE_OTHER): Payer: Medicare Other

## 2022-01-16 ENCOUNTER — Encounter: Payer: Self-pay | Admitting: Gastroenterology

## 2022-01-16 VITALS — BP 130/68 | HR 80 | Ht 62.0 in | Wt 196.0 lb

## 2022-01-16 DIAGNOSIS — R932 Abnormal findings on diagnostic imaging of liver and biliary tract: Secondary | ICD-10-CM

## 2022-01-16 DIAGNOSIS — R748 Abnormal levels of other serum enzymes: Secondary | ICD-10-CM

## 2022-01-16 DIAGNOSIS — D509 Iron deficiency anemia, unspecified: Secondary | ICD-10-CM

## 2022-01-16 LAB — HEPATIC FUNCTION PANEL
ALT: 205 U/L — ABNORMAL HIGH (ref 0–35)
AST: 239 U/L — ABNORMAL HIGH (ref 0–37)
Albumin: 4 g/dL (ref 3.5–5.2)
Alkaline Phosphatase: 91 U/L (ref 39–117)
Bilirubin, Direct: 0.2 mg/dL (ref 0.0–0.3)
Total Bilirubin: 0.7 mg/dL (ref 0.2–1.2)
Total Protein: 8.3 g/dL (ref 6.0–8.3)

## 2022-01-16 LAB — CK: Total CK: 77 U/L (ref 7–177)

## 2022-01-16 NOTE — Patient Instructions (Signed)
You have been scheduled for an endoscopy. Please follow written instructions given to you at your visit today. If you use inhalers (even only as needed), please bring them with you on the day of your procedure.  Your provider has requested that you go to the basement level for lab work before leaving today. Press "B" on the elevator. The lab is located at the first door on the left as you exit the elevator.  You will be due for right upper quadrant ultrasound in November 2023. We will contact you with an appointment when it gets closer to that time.  If you are age 59 or older, your body mass index should be between 23-30. Your Body mass index is 35.85 kg/m. If this is out of the aforementioned range listed, please consider follow up with your Primary Care Provider.  If you are age 34 or younger, your body mass index should be between 19-25. Your Body mass index is 35.85 kg/m. If this is out of the aformentioned range listed, please consider follow up with your Primary Care Provider.   ________________________________________________________  The [place] [facility] providers would like to encourage you to use [facility] to communicate with providers for non-urgent requests or questions.  Due to long hold times on the telephone, sending your provider a message by [facility] may be a faster and more efficient way to get a response.  Please allow 48 business hours for a response.  Please remember that this is for non-urgent requests.  _______________________________________________________  Due to recent changes in healthcare laws, you may see the results of your imaging and laboratory studies on MyChart before your provider has had a chance to review them.  We understand that in some cases there may be results that are confusing or concerning to you. Not all laboratory results come back in the same time frame and the provider may be waiting for multiple results in order to interpret others.  Please give [place] 48  hours in order for your provider to thoroughly review all the results before contacting the office for clarification of your results.

## 2022-01-16 NOTE — Progress Notes (Signed)
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  Patient reports a longstanding history of ITP treated with splenectomy at age 34, has been on periodic steroids and rituximab for ITP in the past, sounds like she has responded to rituximab most recently.  She had some elevated liver enzymes at least for the past year or so from review of her chart, AST, ALT, alk phos elevated to mid to high 100s.  Most recently checked a few days ago and in the 200s which is new for her.  She states her brother had cirrhosis from alcoholism.  The patient denies any significant alcohol use at all.  She states she weighs more now than she is ever had in the past, prednisone has put weight on her when needed for her ITP.  She denies any new medications over the past year other than perhaps midodrine which she takes for hypotension.  She was placed on Zocor at 1 point time and this was held due to her liver enzyme elevation.  She is not taking any NSAIDs at this time, historically she did take [facility] powders for some time.  She does have some episodic cramping in her abdomen.  Denies any nausea or vomiting, eating okay, no reflux symptoms.  She takes stool softeners for her bowels such as Colace which keeps her regular.  She denies any blood in her stools.  She has never had an EGD but states someone told her in the past she had ulcers.  She has had a colonoscopy more than 10 years ago, she cannot recall where or when this was done.  In regards to her COPD she has never been on supplemental oxygen.  She denies any cardiac history.  She seems to be tolerating the midodrine recently and her blood pressures have been much better since she has been on it.  She was admitted to the hospital in May for hypotension,.  She has been ruled out for adrenal insufficiency.  Thus  far her work-up has shown negative hepatitis B and C testing in October of last year.  She had iron studies done which actually showed a mild iron deficiency back in May.  INR 1.3, platelets most recently in the 200s.  She had a CT scan when she was in the hospital in May, which showed fatty liver and suggested changes of cirrhosis.  She is never had jaundice, no history of variceal bleeding, no history of hepatic encephalopathy, no history of ascites.   Hep B surface antigen (-) and hep C AB (-) 05/09/21  Labs 12/02/21: B12 549, iron 37 (low - normal 50-175), ferritin 226, TIBC 317 AP 129, ALT 178, AST 146, B il 0.4, GGT 86  11/25/21: INR 1.3 Ammonia 23  CT abdomen pelvis with contrast 11/25/21: EXAM: CT ABDOMEN AND PELVIS WITH CONTRAST   TECHNIQUE: Multidetector CT imaging of the abdomen and pelvis was performed using the standard protocol following bolus administration of intravenous contrast.   RADIATION DOSE REDUCTION: This exam was performed according to the departmental dose-optimization program which includes automated exposure control, adjustment of the mA and/or kV according to patient size and/or use of iterative reconstruction technique.   CONTRAST:  179mL OMNIPAQUE IOHEXOL 300 MG/ML  SOLN   COMPARISON:  None Available.   FINDINGS: Evaluation of this exam is limited due to respiratory motion artifact.  Lower chest: The visualized lung bases are clear. There is coronary vascular calcification.   No intra-abdominal free air or free fluid.   Hepatobiliary: Fatty liver. Slight irregularity of the liver contour suspicious for early cirrhosis. No intrahepatic biliary ductal dilatation. Faint subcentimeter right hepatic hypodense focus is too small to characterize. There is cholecystectomy. No retained calcified stone noted in the central CBD.   Pancreas: Unremarkable. No pancreatic ductal dilatation or surrounding inflammatory changes.   Spleen: Splenectomy.    Adrenals/Urinary Tract: The adrenal glands are unremarkable. There is no hydronephrosis on either side. There is symmetric enhancement and excretion of contrast by both kidneys. The visualized ureters and urinary bladder appear unremarkable.   Stomach/Bowel: Several small scattered sigmoid diverticula without active inflammatory changes. There is no bowel obstruction or active inflammation. The appendix is normal.   Vascular/Lymphatic: Advanced aortoiliac atherosclerotic disease. The IVC is unremarkable no portal venous gas. There is no adenopathy.   Reproductive: Hysterectomy.  No adnexal masses.   Other: None   Musculoskeletal: Degenerative changes of spine. No acute osseous pathology.   IMPRESSION: 1. No acute intra-abdominal or pelvic pathology. 2. Fatty liver with findings suspicious for cirrhosis. 3. Small scattered sigmoid diverticula. No bowel obstruction. Normal appendix. 4. Aortic Atherosclerosis (ICD10-I70.0).      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   Hypotension    Osteopenia after menopause 03/21/2021   Stroke [facility])    Tobacco abuse 03/21/2021   Vitamin D deficiency      Past Surgical History:  Procedure Laterality Date   ABDOMINAL HYSTERECTOMY     BREAST LUMPECTOMY Right 01/29/2017   for benign disease   spleenectomy     SPLENECTOMY     Family History  Problem Relation Age of Onset   Brain cancer Brother    Colon cancer Neg Hx    Rectal cancer Neg Hx    Stomach cancer Neg Hx    Social History    Tobacco Use   Smoking status: Every Day    Types: Cigarettes   Smokeless tobacco: Never  Vaping Use   Vaping Use: Never used  Substance Use Topics   Alcohol use: Never   Drug use: Never   Current Outpatient Medications  Medication Sig Dispense Refill   alendronate (FOSAMAX) 70 MG tablet Take 70 mg by mouth every Friday.     Biotin w/ Vitamins C & E (HAIR/SKIN/NAILS PO) Take 1 capsule by mouth daily.     Budeson-Glycopyrrol-Formoterol (BREZTRI AEROSPHERE) 160-9-4.8 MCG/ACT AERO 2 puffs     busPIRone (BUSPAR) 15 MG tablet Take 15 mg by mouth 2 (two) times daily.     Calcium Carb-Cholecalciferol (CALCIUM + D3 PO) Take 1 tablet by mouth daily.     Ferrous Sulfate (IRON PO) Take by mouth. 9 mg two times daily     furosemide (LASIX) 20 MG tablet Take 20 mg by mouth daily.     hydrOXYzine (ATARAX) 25 MG tablet Take 1 tablet by mouth 2 (two) times  daily as needed for anxiety.     ibuprofen (ADVIL) 200 MG tablet Take 600 mg by mouth every 6 (six) hours as needed for mild pain or headache.     ipratropium-albuterol (DUONEB) 0.5-2.5 (3) MG/3ML SOLN Inhale 3 mLs into the lungs every 6 (six) hours as needed for shortness of breath.     levothyroxine (SYNTHROID) 50 MCG tablet 1 tablet in the morning on an empty stomach     midodrine (PROAMATINE) 10 MG tablet Take 1 tablet (10 mg total) by mouth 3 (three) times daily with meals. 90 tablet 3   Multiple Vitamin (MULTIVITAMIN) tablet Take 1 tablet by mouth daily.     Omega-3 Fatty Acids (OMEGA 3 PO) Take 1 capsule by mouth daily.     Oxycodone HCl 20 MG TABS 1 tablet as needed     pramipexole (MIRAPEX) 0.5 MG tablet Take 0.5 mg by mouth at bedtime.     QUEtiapine (SEROQUEL) 200 MG tablet Take 200 mg by mouth at bedtime.     VITAMIN A PO Take 1 tablet by mouth daily.     ondansetron (ZOFRAN) 4 MG tablet Take 1 tablet (4 mg total) by mouth every 4 (four) hours as needed for nausea. (Patient not taking: Reported on 01/16/2022) 90 tablet 3   simvastatin  (ZOCOR) 40 MG tablet Take 40 mg by mouth at bedtime. (Patient not taking: Reported on 01/16/2022)     No current facility-administered medications for this visit.   No Known Allergies   Review of Systems: All systems reviewed and negative except where noted in HPI.   Labs reviewed in Epic  Physical Exam: BP 130/68   Pulse 80   Ht $R'5\' 2"'Du$  (1.575 m)   Wt 196 lb (88.9 kg)   SpO2 96%   BMI 35.85 kg/m  Constitutional: Pleasant,well-developed, female in no acute distress. HEENT: Normocephalic and atraumatic. Conjunctivae are normal. No scleral icterus. Neck supple.  Cardiovascular: Normal rate, regular rhythm.  Pulmonary/chest: Effort normal and breath sounds normal.  Abdominal: Soft, nondistended, nontender. There are no masses palpable.  Extremities: no edema Lymphadenopathy: No cervical adenopathy noted. Neurological: Alert and oriented to person place and time. Skin: Skin is warm and dry. No rashes noted. Psychiatric: Normal mood and affect. Behavior is normal.   ASSESSMENT AND PLAN: 71 year old female here for new patient assessment the following:  Elevated liver enzymes Abnormal liver imaging -suspected cirrhosis Iron deficiency  Discussed elevated liver enzyme issue with the patient.  Imaging suggest she may have cirrhosis.  She is status post splenectomy, history ITP, on steroids intermittently over years with fatty liver.  If she does have cirrhosis very well could be due to fatty liver, although she warrants further serologic evaluation to rule out other causes of chronic liver disease and to see what is causing her ongoing transaminitis.  I recommend going to the lab today to send for serologic evaluation, will also check creatine kinase given her last AST was greater than ALT.  We will also check immunity to hepatitis a and B and vaccinate if needed.  We discussed cirrhosis in general, risks for future decompensation and risks for [facility] over time if she does have cirrhosis.   Recommend repeat ultrasound in 6 months from her last exam for [facility] screening, will check an AFP today.  I am recommending EGD to screen for esophageal varices, and to further evaluate iron deficiency.  I discussed risk benefits of EGD and anesthesia with her, she wants to proceed.  She states  her pulmonary status is at baseline.  She will need close monitoring moving forward, recommend weight loss over time to treat fatty liver if possible.  Would continue to hold statin for now.  Otherwise we discussed her mild iron deficiency that was noted.  Given this finding and her last colonoscopy was more than 10 years ago and recommending a colonoscopy to further evaluate.  I discussed risk and benefits of this.  She is rather adamant she never wants another colonoscopy.  I relayed to her my concern about her iron loss, want to make sure no polyp or mass lesion causing this.  She will think about this and let me know if she reconsiders.  Especially if EGD is negative for clear cause.    Plan: - labs today to further evaluate for chronic liver diseases and transaminitis - schedule EGD at the [facility] - colonoscopy recommended as above, patient is declining this at present time - recall RUQ [place] in November - counseled on cirrhosis and risks for decompensation moving forward, will need to see at least every 6 months  Jolly Mango, MD [facility]  CC: [name], MD

## 2022-01-22 ENCOUNTER — Other Ambulatory Visit: Payer: Self-pay

## 2022-01-22 DIAGNOSIS — D509 Iron deficiency anemia, unspecified: Secondary | ICD-10-CM

## 2022-01-22 DIAGNOSIS — R932 Abnormal findings on diagnostic imaging of liver and biliary tract: Secondary | ICD-10-CM

## 2022-01-22 DIAGNOSIS — R748 Abnormal levels of other serum enzymes: Secondary | ICD-10-CM

## 2022-01-22 LAB — MITOCHONDRIAL ANTIBODIES: Mitochondrial M2 Ab, IgG: <=20 U (ref <=20.0)

## 2022-01-22 LAB — HEPATITIS B SURFACE ANTIBODY,QUALITATIVE: Hep B S Ab: NONREACTIVE

## 2022-01-22 LAB — HEPATITIS B SURFACE ANTIGEN: Hepatitis B Surface Ag: NONREACTIVE

## 2022-01-22 LAB — IGG: IgG (Immunoglobin G), Serum: 1169 mg/dL (ref 600–1540)

## 2022-01-22 LAB — ANA: Anti Nuclear Antibody (ANA): POSITIVE — AB

## 2022-01-22 LAB — ANTI-NUCLEAR AB-TITER (ANA TITER)
ANA TITER: 1:40 {titer} — ABNORMAL HIGH
ANA Titer 1: 1:40 {titer} — ABNORMAL HIGH

## 2022-01-22 LAB — AFP TUMOR MARKER: AFP-Tumor Marker: 6.1 ng/mL — ABNORMAL HIGH

## 2022-01-22 LAB — ALPHA-1-ANTITRYPSIN: A-1 Antitrypsin, Ser: 185 mg/dL (ref 83–199)

## 2022-01-22 LAB — HEPATITIS A ANTIBODY, TOTAL: Hepatitis A AB,Total: REACTIVE — AB

## 2022-01-22 LAB — ANTI-SMOOTH MUSCLE ANTIBODY, IGG: Actin (Smooth Muscle) Antibody (IGG): <20 U (ref <20)

## 2022-01-22 LAB — HEPATITIS C ANTIBODY: Hepatitis C Ab: NONREACTIVE

## 2022-01-22 LAB — CERULOPLASMIN: Ceruloplasmin: 31 mg/dL (ref 18–53)

## 2022-01-27 ENCOUNTER — Other Ambulatory Visit: Payer: Self-pay

## 2022-01-27 DIAGNOSIS — R932 Abnormal findings on diagnostic imaging of liver and biliary tract: Secondary | ICD-10-CM

## 2022-01-27 DIAGNOSIS — R748 Abnormal levels of other serum enzymes: Secondary | ICD-10-CM

## 2022-01-28 ENCOUNTER — Encounter: Payer: Self-pay | Admitting: Certified Registered Nurse Anesthetist

## 2022-01-30 ENCOUNTER — Ambulatory Visit (INDEPENDENT_AMBULATORY_CARE_PROVIDER_SITE_OTHER): Payer: Medicare Other | Admitting: Gastroenterology

## 2022-01-30 DIAGNOSIS — R932 Abnormal findings on diagnostic imaging of liver and biliary tract: Secondary | ICD-10-CM | POA: Diagnosis not present

## 2022-01-30 DIAGNOSIS — Z23 Encounter for immunization: Secondary | ICD-10-CM | POA: Diagnosis not present

## 2022-01-30 DIAGNOSIS — R748 Abnormal levels of other serum enzymes: Secondary | ICD-10-CM | POA: Diagnosis not present

## 2022-02-03 ENCOUNTER — Other Ambulatory Visit (INDEPENDENT_AMBULATORY_CARE_PROVIDER_SITE_OTHER): Payer: Medicare Other

## 2022-02-03 ENCOUNTER — Ambulatory Visit (AMBULATORY_SURGERY_CENTER): Payer: Medicare Other | Admitting: Gastroenterology

## 2022-02-03 ENCOUNTER — Encounter: Payer: Self-pay | Admitting: Gastroenterology

## 2022-02-03 VITALS — BP 101/69 | HR 79 | Temp 98.0°F | Resp 11 | Ht 62.0 in | Wt 192.0 lb

## 2022-02-03 DIAGNOSIS — R748 Abnormal levels of other serum enzymes: Secondary | ICD-10-CM | POA: Diagnosis not present

## 2022-02-03 DIAGNOSIS — K319 Disease of stomach and duodenum, unspecified: Secondary | ICD-10-CM | POA: Diagnosis not present

## 2022-02-03 DIAGNOSIS — R932 Abnormal findings on diagnostic imaging of liver and biliary tract: Secondary | ICD-10-CM

## 2022-02-03 DIAGNOSIS — K200 Eosinophilic esophagitis: Secondary | ICD-10-CM | POA: Diagnosis not present

## 2022-02-03 DIAGNOSIS — K2970 Gastritis, unspecified, without bleeding: Secondary | ICD-10-CM

## 2022-02-03 DIAGNOSIS — K2950 Unspecified chronic gastritis without bleeding: Secondary | ICD-10-CM | POA: Diagnosis not present

## 2022-02-03 DIAGNOSIS — D509 Iron deficiency anemia, unspecified: Secondary | ICD-10-CM | POA: Diagnosis not present

## 2022-02-03 DIAGNOSIS — K7460 Unspecified cirrhosis of liver: Secondary | ICD-10-CM

## 2022-02-03 LAB — HEPATIC FUNCTION PANEL
ALT: 182 U/L — ABNORMAL HIGH (ref 0–35)
AST: 189 U/L — ABNORMAL HIGH (ref 0–37)
Albumin: 3.7 g/dL (ref 3.5–5.2)
Alkaline Phosphatase: 72 U/L (ref 39–117)
Bilirubin, Direct: 0.2 mg/dL (ref 0.0–0.3)
Total Bilirubin: 0.8 mg/dL (ref 0.2–1.2)
Total Protein: 7.4 g/dL (ref 6.0–8.3)

## 2022-02-03 MED ORDER — OMEPRAZOLE 40 MG PO CPDR: 40.0000 mg | DELAYED_RELEASE_CAPSULE | Freq: Two times a day (BID) | ORAL | 3 refills | Status: DC

## 2022-02-03 MED ORDER — SODIUM CHLORIDE 0.9 % IV SOLN: 500.0000 mL | Freq: Once | INTRAVENOUS | Status: DC

## 2022-02-03 NOTE — Progress Notes (Signed)
0924 Robinul 0.1 mg IV given due large amount of secretions upon assessment.  MD made aware, vss 

## 2022-02-03 NOTE — Op Note (Addendum)
[facility] Patient Name: Sussan Meter Procedure Date: 02/03/2022 9:23 AM MRN: [id number] Endoscopist: Remo Lipps P. Havery Moros , MD Age: 71 Referring MD:  Date of Birth: Feb 03, 1951 Gender: Female Account #: [id number] Procedure:                Upper GI endoscopy Indications:              Cirrhosis rule out esophageal varices, history of                            iron deficiency - patient had been on Goody powders                            stopped, advised to avoid NSAIDs but has been on                            ibuprofen Medicines:                Monitored Anesthesia Care Procedure:                Pre-Anesthesia Assessment:                           - Prior to the procedure, a History and Physical                            was performed, and patient medications and                            allergies were reviewed. The patient's tolerance of                            previous anesthesia was also reviewed. The risks                            and benefits of the procedure and the sedation                            options and risks were discussed with the patient.                            All questions were answered, and informed consent                            was obtained. Prior Anticoagulants: The patient has                            taken no previous anticoagulant or antiplatelet                            agents. ASA Grade Assessment: III - A patient with                            severe systemic disease. After reviewing the risks  and benefits, the patient was deemed in                            satisfactory condition to undergo the procedure.                           After obtaining informed consent, the endoscope was                            passed under direct vision. Throughout the                            procedure, the patient's blood pressure, pulse, and                            oxygen saturations were monitored  continuously. The                            GIF [id number] #[id number] was introduced through the                            mouth, and advanced to the second part of duodenum.                            The upper GI endoscopy was accomplished without                            difficulty. The patient tolerated the procedure                            well. Scope In: Scope Out: Findings:                 Esophagogastric landmarks were identified: the                            Z-line was found at 38 cm, the gastroesophageal                            junction was found at 38 cm and the upper extent of                            the gastric folds was found at 38 cm from the                            incisors.                           LA Grade A esophagitis was found focally at the GEJ.                           A plaque was found in the lower third of the  esophagus, 35 cm from the incisors, could not be                            lavaged off. Biopsies were taken with a cold                            forceps for histology.                           The exam of the esophagus was otherwise normal. No                            varices                           Diffuse mild inflammation characterized by                            erythema, friability and granularity was found in                            the entire examined stomach.                           One non-bleeding gastric ulcer with no stigmata of                            bleeding was found in the prepyloric region of the                            stomach, with some associated inflammatory                            nodularity. The lesion was 3 mm in largest                            dimension. Biopsies were taken from the periphery                            of the lesion with a cold forceps for histology.                           The exam of the stomach was otherwise normal. No                             varices                           Biopsies were taken with a cold forceps in the                            gastric body, at the incisura and in the gastric  antrum for Helicobacter pylori testing.                           The examined duodenum was normal. Complications:            No immediate complications. Estimated blood loss:                            Minimal. Estimated Blood Loss:     Estimated blood loss was minimal. Impression:               - Esophagogastric landmarks identified.                           - Focal LA Grade A reflux esophagitis.                           - Plaque in the lower third of the esophagus.                            Biopsied.                           - Gastritis.                           - Non-bleeding gastric ulcer with no stigmata of                            bleeding. Biopsied.                           - Normal examined duodenum.                           - Biopsies were taken with a cold forceps for                            Helicobacter pylori testing.                           Gastritis / gastric ulcer could be cause of iron                            deficiency. Given last colonoscopy was > 10 years                            ago, I have recommended colonoscopy to the patient,                            she has declined this to date. She can schedule                            this if she changes her mind. Also overdue for                            follow  up LFTs which are ordered at the lab for her                            today. Recommendation:           - Patient has a contact number available for                            emergencies. The signs and symptoms of potential                            delayed complications were discussed with the                            patient. Return to normal activities tomorrow.                            Written discharge instructions were provided to the                             patient.                           - Resume previous diet.                           - Continue present medications.                           - Stop / avoid all NSAIDS                           - Start omeprazole '40mg'$  twice daily for 4 weeks,                            then once daily thereafter                           - Await pathology results.                           - Go to the lab for blood draw today (follow up                            LFTs)                           - Consideration for repeat EGD in a few months to                            check for mucosal healing. Will discuss if she is                            willing to do colonoscopy at that time Carlota Raspberry. Kennedey Digilio, MD 02/03/2022 9:48:06 AM This report has been signed electronically.

## 2022-02-03 NOTE — Progress Notes (Signed)
History and Physical Interval Note: Patient seen on 01/16/22 - no interval changes. Suspected cirrhosis, history of IDA - EGD to further evaluate, screen for varices. Colonoscopy also recommended for history of IDA and no recent colonoscopy exams but patient has DECLINED that exam. I have discussed risks / benefits of EGD and anesthesia, she understands and wants to proceed. She denies any cardiopulmonary symptoms today, otherwise feels at baseline without complaints.   02/03/2022 9:25 AM  Claudius Sis  has presented today for endoscopic procedure(s), with the diagnosis of  Encounter Diagnoses  Name Primary?   Abnormal liver diagnostic imaging Yes   Iron deficiency anemia, unspecified iron deficiency anemia type   .  The various methods of evaluation and treatment have been discussed with the patient and/or family. After consideration of risks, benefits and other options for treatment, the patient has consented to  the endoscopic procedure(s).   The patient's history has been reviewed, patient examined, no change in status, stable for surgery.  I have reviewed the patient's chart and labs.  Questions were answered to the patient's satisfaction.    Jolly Mango, MD [facility]

## 2022-02-03 NOTE — Patient Instructions (Addendum)
Information on gastritis and esophagitis given to you today.  Await pathology results.  Resume previous diet and medications.  Start omeprazole 40 mg twice a day for 4 weeks, then once a day thereafter.   YOU HAD AN ENDOSCOPIC PROCEDURE TODAY AT [facility]:   Refer to the procedure report that was given to you for any specific questions about what was found during the examination.  If the procedure report does not answer your questions, please call your gastroenterologist to clarify.  If you requested that your care partner not be given the details of your procedure findings, then the procedure report has been included in a sealed envelope for you to review at your convenience later.  YOU SHOULD EXPECT: Some feelings of bloating in the abdomen. Passage of more gas than usual.  Walking can help get rid of the air that was put into your GI tract during the procedure and reduce the bloating. If you had a lower endoscopy (such as a colonoscopy or flexible sigmoidoscopy) you may notice spotting of blood in your stool or on the toilet paper. If you underwent a bowel prep for your procedure, you may not have a normal bowel movement for a few days.  Please Note:  You might notice some irritation and congestion in your nose or some drainage.  This is from the oxygen used during your procedure.  There is no need for concern and it should clear up in a day or so.  SYMPTOMS TO REPORT IMMEDIATELY:   Following upper endoscopy (EGD)  Vomiting of blood or coffee ground material  New chest pain or pain under the shoulder blades  Painful or persistently difficult swallowing  New shortness of breath  Fever of 100F or higher  Black, tarry-looking stools  For urgent or emergent issues, a gastroenterologist can be reached at any hour by calling [phone]. Do not use MyChart messaging for urgent concerns.    DIET:  We do recommend a small meal at first, but then you may proceed to your  regular diet.  Drink plenty of fluids but you should avoid alcoholic beverages for 24 hours.  ACTIVITY:  You should plan to take it easy for the rest of today and you should NOT DRIVE or use heavy machinery until tomorrow (because of the sedation medicines used during the test).    FOLLOW UP: Our staff will call the number listed on your records the next business day following your procedure.  We will call around 7:15- 8:00 am to check on you and address any questions or concerns that you may have regarding the information given to you following your procedure. If we do not reach you, we will leave a message.  If you develop any symptoms (ie: fever, flu-like symptoms, shortness of breath, cough etc.) before then, please call [phone].  If you test positive for Covid 19 in the 2 weeks post procedure, please call and report this information to [place].    If any biopsies were taken you will be contacted by phone or by letter within the next 1-3 weeks.  Please call us at [phone] if you have not heard about the biopsies in 3 weeks.    SIGNATURES/CONFIDENTIALITY: You and/or your care partner have signed paperwork which will be entered into your electronic medical record.  These signatures attest to the fact that that the information above on your After Visit Summary has been reviewed and is understood.  Full responsibility of the confidentiality of  this discharge information lies with you and/or your care-partner.

## 2022-02-03 NOTE — Progress Notes (Signed)
Vitals-AS  Pt's states no medical or surgical changes since previsit or office visit.  

## 2022-02-03 NOTE — Progress Notes (Signed)
Called to room to assist during endoscopic procedure.  Patient ID and intended procedure confirmed with present staff. Received instructions for my participation in the procedure from the performing physician.  

## 2022-02-03 NOTE — Progress Notes (Signed)
Report given to PACU, vss 

## 2022-02-04 ENCOUNTER — Other Ambulatory Visit: Payer: Self-pay

## 2022-02-04 ENCOUNTER — Telehealth: Payer: Self-pay

## 2022-02-04 DIAGNOSIS — R748 Abnormal levels of other serum enzymes: Secondary | ICD-10-CM

## 2022-02-04 DIAGNOSIS — K760 Fatty (change of) liver, not elsewhere classified: Secondary | ICD-10-CM

## 2022-02-04 DIAGNOSIS — R932 Abnormal findings on diagnostic imaging of liver and biliary tract: Secondary | ICD-10-CM

## 2022-02-04 NOTE — Telephone Encounter (Signed)
Attempted f/u call. No answer, left VM. 

## 2022-02-18 ENCOUNTER — Telehealth: Payer: Self-pay | Admitting: Gastroenterology

## 2022-02-18 NOTE — Telephone Encounter (Signed)
Returned call to patient. We reviewed her 02/03/22 pathology results and recommendations. See result note for details.

## 2022-02-18 NOTE — Telephone Encounter (Signed)
Patient is returning your call.  

## 2022-02-25 ENCOUNTER — Telehealth: Payer: Self-pay

## 2022-02-25 NOTE — Telephone Encounter (Signed)
-----   Message from Yevette Edwards, RN sent at 02/04/2022  3:58 PM EDT ----- Regarding: Labs Hepatic function panel - order is in epic

## 2022-02-25 NOTE — Telephone Encounter (Signed)
Left pt a detailed vm reminding her that she is due for repeat LFT's this week. I told pt that she can have her labs drawn when she comes in for her 2nd vaccine on Friday, 02/27/22. I told pt that she can have labs before or after her vaccine. I advised pt to call back with any questions or concerns.

## 2022-02-27 ENCOUNTER — Other Ambulatory Visit (INDEPENDENT_AMBULATORY_CARE_PROVIDER_SITE_OTHER): Payer: Medicare Other

## 2022-02-27 DIAGNOSIS — K760 Fatty (change of) liver, not elsewhere classified: Secondary | ICD-10-CM

## 2022-02-27 DIAGNOSIS — R932 Abnormal findings on diagnostic imaging of liver and biliary tract: Secondary | ICD-10-CM

## 2022-02-27 DIAGNOSIS — R748 Abnormal levels of other serum enzymes: Secondary | ICD-10-CM | POA: Diagnosis not present

## 2022-02-27 LAB — HEPATIC FUNCTION PANEL
ALT: 132 U/L — ABNORMAL HIGH (ref 0–35)
AST: 136 U/L — ABNORMAL HIGH (ref 0–37)
Albumin: 3.9 g/dL (ref 3.5–5.2)
Alkaline Phosphatase: 105 U/L (ref 39–117)
Bilirubin, Direct: 0.1 mg/dL (ref 0.0–0.3)
Total Bilirubin: 0.5 mg/dL (ref 0.2–1.2)
Total Protein: 8 g/dL (ref 6.0–8.3)

## 2022-02-27 NOTE — Telephone Encounter (Signed)
Pt had labs drawn today

## 2022-03-02 ENCOUNTER — Other Ambulatory Visit: Payer: Self-pay

## 2022-03-02 DIAGNOSIS — R932 Abnormal findings on diagnostic imaging of liver and biliary tract: Secondary | ICD-10-CM

## 2022-03-02 DIAGNOSIS — R748 Abnormal levels of other serum enzymes: Secondary | ICD-10-CM

## 2022-03-02 DIAGNOSIS — K760 Fatty (change of) liver, not elsewhere classified: Secondary | ICD-10-CM

## 2022-03-03 ENCOUNTER — Ambulatory Visit (INDEPENDENT_AMBULATORY_CARE_PROVIDER_SITE_OTHER): Payer: Medicare Other | Admitting: Gastroenterology

## 2022-03-03 DIAGNOSIS — R748 Abnormal levels of other serum enzymes: Secondary | ICD-10-CM

## 2022-03-03 DIAGNOSIS — K760 Fatty (change of) liver, not elsewhere classified: Secondary | ICD-10-CM

## 2022-03-03 DIAGNOSIS — R932 Abnormal findings on diagnostic imaging of liver and biliary tract: Secondary | ICD-10-CM | POA: Diagnosis not present

## 2022-03-03 NOTE — Progress Notes (Signed)
Patient arrived at the office today for 2nd Heplisav injection. Pt tolerated injection well. Common side effects reviewed with patient. No complications noted. Pt had no concerns at the end of the visit.

## 2022-03-25 ENCOUNTER — Telehealth: Payer: Self-pay

## 2022-03-25 DIAGNOSIS — K760 Fatty (change of) liver, not elsewhere classified: Secondary | ICD-10-CM

## 2022-03-25 DIAGNOSIS — R932 Abnormal findings on diagnostic imaging of liver and biliary tract: Secondary | ICD-10-CM

## 2022-03-25 DIAGNOSIS — R748 Abnormal levels of other serum enzymes: Secondary | ICD-10-CM

## 2022-03-25 NOTE — Telephone Encounter (Signed)
Order placed in epic - patient will have labs drawn before October appt  Lab reminder mailed to patient.

## 2022-03-25 NOTE — Telephone Encounter (Signed)
-----   Message from Yevette Edwards, RN sent at 01/22/2022 11:13 AM EDT ----- Regarding: Labs AFP due - need to enter order

## 2022-04-17 ENCOUNTER — Other Ambulatory Visit: Payer: Self-pay | Admitting: Oncology

## 2022-04-17 ENCOUNTER — Encounter: Payer: Self-pay | Admitting: Oncology

## 2022-04-17 ENCOUNTER — Inpatient Hospital Stay: Payer: Medicare Other | Attending: Oncology | Admitting: Oncology

## 2022-04-17 ENCOUNTER — Inpatient Hospital Stay: Payer: Medicare Other

## 2022-04-17 VITALS — BP 105/61 | HR 87 | Temp 98.0°F | Resp 20 | Ht 62.0 in | Wt 192.8 lb

## 2022-04-17 DIAGNOSIS — D693 Immune thrombocytopenic purpura: Secondary | ICD-10-CM

## 2022-04-17 DIAGNOSIS — Z9081 Acquired absence of spleen: Secondary | ICD-10-CM

## 2022-04-17 DIAGNOSIS — D72829 Elevated white blood cell count, unspecified: Secondary | ICD-10-CM

## 2022-04-17 LAB — CBC AND DIFFERENTIAL
HCT: 41 (ref 36–46)
Hemoglobin: 13.7 (ref 12.0–16.0)
Neutrophils Absolute: 4.99
Platelets: 182 10*3/uL (ref 150–400)
WBC: 9.6

## 2022-04-17 LAB — COMPREHENSIVE METABOLIC PANEL
Albumin: 4.2 (ref 3.5–5.0)
Calcium: 10.3 (ref 8.7–10.7)

## 2022-04-17 LAB — HEPATIC FUNCTION PANEL
ALT: 102 U/L — AB (ref 7–35)
AST: 98 — AB (ref 13–35)
Alkaline Phosphatase: 96 (ref 25–125)
Bilirubin, Total: 0.7

## 2022-04-17 LAB — BASIC METABOLIC PANEL
BUN: 27 — AB (ref 4–21)
CO2: 23 — AB (ref 13–22)
Chloride: 108 (ref 99–108)
Creatinine: 0.9 (ref 0.5–1.1)
Glucose: 141
Potassium: 4.3 mEq/L (ref 3.5–5.1)
Sodium: 140 (ref 137–147)

## 2022-04-17 LAB — CBC: RBC: 3.93 (ref 3.87–5.11)

## 2022-04-17 NOTE — Progress Notes (Signed)
Patient Care Team: [name], MD as PCP - General (Internal Medicine) [name], MD as Consulting Physician (Oncology)  Clinic Day:  04/17/22  Referring physician: [name], MD  ASSESSMENT & PLAN:   Assessment & Plan: History of ITP with multiple relapses.  The patient completed 4 cycles of weekly rituximab and treatment of her ITP in November 2022.  She was then completely weaned off of prednisone.     History of splenectomy, twice.   Chronic leukocytosis.   Tobacco abuse. She has now quit, but is vaping.   History of lumpectomy in July 2018 which was benign.   Osteopenia, stable to improved. Repeat bone density is scheduled through Dr. Jannette Fogo.   Elevated liver transaminases, improved. She states that Dr. Jannette Fogo has evaluated her with imaging and labs and she was found to have fatty liver. We need to eliminate Tylenol all together, but I doubt that this is really the cause of her abnormal liver function tests.    Dyspnea. She has chronic lung disease, she states she uses her inhalers and nebulizers.    9.   Diabetes mellitus type 2.  10.  Peptic ulcer disease.  She will follow up this fall with the gastroenterologist.  11.  Hypotension, she is now on midodrine 10 mg tid.    She initially had an excellent response to high-dose prednisone, but when we tapered her rapidly, she had recurrent severe thrombocytopenia.  We were unable to slowly taper the prednisone, so she went on to receive rituximab weekly for 4 weeks completed on November 17th, 2022 with an excellent response.  She was rapidly tapered off of prednisone while receiving rituximab. Her platelets have remained normal and are 182,000 today. I will see her back in 3-4 months with CBC and CMP.     The patient understands the plans discussed today and is in agreement with them.  She knows to contact our office if she develops concerns prior to her next appointment.    [name], MD  [facility] [address] [place] [address] Dept: [phone] Dept Fax: [phone]   Orders Placed This Encounter  Procedures   CBC and differential    This external order was created through the Results Console.   CBC    This external order was created through the Results Console.   Basic metabolic panel    This external order was created through the Results Console.   Comprehensive metabolic panel    This external order was created through the Results Console.   Hepatic function panel    This external order was created through the Results Console.      CHIEF COMPLAINT:  CC: A 71 year old female with history of chronic ITP here for 2 month evaluation  Current Treatment:  Surveillance  INTERVAL HISTORY:  Man[place]op is here today for repeat clinical assessment. She recently had an EGD and they found an ulcer. She was placed on omeprazole 40 mg daily. They plan to follow up in the fall. She has been diagnosed with diabetes mellitus now, and is on metformin 500 mg daily. She has known fatty liver but her liver transaminases are mildly improved since her last visit, with an SGOT of 98 and SGPT of 102. Her CBC is normal, including a platelet count of 182,000. She still has chronic pain of her legs but her appetite is good.She has had problems with hypotension and is  now on midodrine 10 mg tid. She denies fevers or chills. She denies pain. Her appetite is good. Her weight is stable.  I have reviewed the past medical history, past surgical history, social history and family history with the patient and they are unchanged from previous note.  ALLERGIES:  has No Known Allergies.  MEDICATIONS:  Current Outpatient Medications  Medication Sig Dispense Refill   alendronate (FOSAMAX) 70 MG tablet Take 70 mg by mouth every Friday.     Biotin w/ Vitamins C & E (HAIR/SKIN/NAILS PO) Take 1 capsule by mouth daily.      Budeson-Glycopyrrol-Formoterol (BREZTRI AEROSPHERE) 160-9-4.8 MCG/ACT AERO 2 puffs     busPIRone (BUSPAR) 15 MG tablet Take 15 mg by mouth 2 (two) times daily.     Calcium Carb-Cholecalciferol (CALCIUM + D3 PO) Take 1 tablet by mouth daily.     Ferrous Sulfate (IRON PO) Take by mouth. 9 mg two times daily     furosemide (LASIX) 20 MG tablet Take 20 mg by mouth daily.     hydrOXYzine (ATARAX) 25 MG tablet Take 1 tablet by mouth 2 (two) times daily as needed for anxiety.     ibuprofen (ADVIL) 200 MG tablet Take 600 mg by mouth every 6 (six) hours as needed for mild pain or headache.     ipratropium-albuterol (DUONEB) 0.5-2.5 (3) MG/3ML SOLN Inhale 3 mLs into the lungs every 6 (six) hours as needed for shortness of breath.     levothyroxine (SYNTHROID) 50 MCG tablet 1 tablet in the morning on an empty stomach     metFORMIN (GLUCOPHAGE-XR) 500 MG 24 hr tablet Take 500 mg by mouth daily.     midodrine (PROAMATINE) 10 MG tablet Take 1 tablet (10 mg total) by mouth 3 (three) times daily with meals. 90 tablet 3   Multiple Vitamin (MULTIVITAMIN) tablet Take 1 tablet by mouth daily.     NON FORMULARY Stool softer pt take 1 a day     Omega-3 Fatty Acids (OMEGA 3 PO) Take 1 capsule by mouth daily.     omeprazole (PRILOSEC) 40 MG capsule Take 1 capsule (40 mg total) by mouth 2 (two) times daily. 90 capsule 3   ondansetron (ZOFRAN) 4 MG tablet Take 1 tablet (4 mg total) by mouth every 4 (four) hours as needed for nausea. 90 tablet 3   Oxycodone HCl 20 MG TABS 1 tablet as needed     Plecanatide (TRULANCE) 3 MG TABS Take 3 mg by mouth daily. Exp: 08-2023, LOT: [id number] 9 tablet 0   pramipexole (MIRAPEX) 0.5 MG tablet Take 0.5 mg by mouth at bedtime.     QUEtiapine (SEROQUEL) 200 MG tablet Take 200 mg by mouth at bedtime.     VITAMIN A PO Take 1 tablet by mouth daily.     No current facility-administered medications for this visit.    HISTORY OF PRESENT ILLNESS:   The patient Azzie is a 70 year old female with  a has a history of ITP originally diagnosed in the 60's.  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 She initially responded to that, but over the years has been on and off corticosteroids for relapsing ITP, and was also treated with danazol for a time.  Her ITP had been in remission for many years, but she does has had chronic leukocytosis.  She  started smoking at age 3 and so she has been smoking approximately 1 pack per day for 49 years and continues to smoke despite numerous discussions about smoking cessation.   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  We recommended an ultrasound-guided biopsy which revealed fibroadenomatoid and fibrocystic changes, including cystic and micro papillary apocrine metaplasia, apocrine adenosis, stromal fibrosis, and foci of pseudo angiomatous stromal hyperplasia and focal sclerosis.  It was recommended that she have excision of this area and that was performed in July by Dr. Melynda Ripple.  The final pathology revealed atrophic breast tissue with fibrocystic changes and foci of usual ductal hyperplasia but no intraductal papilloma, fibroadenoma, atypia or malignancy.  There was another lumpectomy sample which revealed micropapillary ductal hyperplasia, focal ectatic ducts, and patchy peri ductal chronic inflammation.  The other area of lumpectomy had morphologic changes similar to the original biopsy.  We had been seeing her regularly, but then she was lost to follow-up after May 2019. She had a diagnostic right mammogram in April 2020 which revealed evolving fat necrosis.   She was admitted to [facility] in July 2022 due to developing an upper GI bleed from taking BC powders.  We had advised her to discontinue these previously.  She  switched to Tylenol for her pain.  While in the hospital, her platelet count dropped into the teens, and she was given steroids and IVIG.   She had extensive bruising. She was in the hospital for a total of 15 days and her platelet count had gone up to 63,000 at the time of discharge.  She was referred back to [place].  She was given a prescription for prednisone at the time of discharge, but Dr. Hinton Rao advised her not to take this, as her platelet count was normal.  When we saw her in early August her platelet count was up to 168,000.  She had leukocytosis with white count was 14,000.  Her bruises had resolved and she was doing well.   She was then admitted to [facility] in August due to severe thrombocytopenia with a platelet count less than 5,000.  She presented to the hospital after she removed a scab from her knee, and the wound continued to ooze blood.  She received Nplate injection x2, as well as 2 units of PRBCs and 1 unit of platelets.  Upon discharge, her platelet count had improved to 17,000.  She was advised to discontinue aspirin 81 mg.  She once again had extensive bruising, but also noted shortness of breath with exertion, insomnia, and occasional dizziness and lightheadedness.  I saw her for hospital follow-up and she had resolution of the leukocytosis and her platelet count was up to 41,000.  Chemistries revealed worsening elevation of the liver transaminases with an SGOT of 117 and an SGPT of 90.  She states that she has known fatty liver.  As her platelets remained low, she was started on prednisone 60 mg daily. We planned to monitor her CBC weekly and see her every 2 weeks.  Bilateral screening mammogram in August did not reveal any evidence of malignancy.   She initially had an excellent response to high-dose prednisone, but when we tapered her rapidly, she had recurrent severe thrombocytopenia.  We were unable to slowly taper the prednisone, so she went on to receive rituximab weekly for 4  weeks completed on her 17th with an excellent response.  She  was rapidly tapered off of prednisone while receiving rituximab.  2 weeks ago prednisone was discontinued lately count remains normal at 151,000.    REVIEW OF SYSTEMS:   Constitutional: Denies fevers, chills or abnormal weight loss Eyes: Denies blurriness of vision Ears, nose, mouth, throat, and face: Denies mucositis or sore throat Respiratory: Denies cough, dyspnea or wheezes Cardiovascular: Denies palpitation, chest discomfort or lower extremity swelling Gastrointestinal:  Denies nausea, heartburn or change in bowel habits Skin: Denies abnormal skin rashes Lymphatics: Denies new lymphadenopathy or easy bruising Neurological:Denies numbness, tingling or new weaknesses Behavioral/Psych: Mood is stable, no new changes  All other systems were reviewed with the patient and are negative.   VITALS:  Blood pressure 105/61, pulse 87, temperature 98 F (36.7 C), temperature source Oral, resp. rate 20, height '5\' 2"'$  (1.575 m), weight 192 lb 12.8 oz (87.5 kg), SpO2 96 %.  Wt Readings from Last 3 Encounters:  05/08/22 192 lb (87.1 kg)  04/17/22 192 lb 12.8 oz (87.5 kg)  02/03/22 192 lb (87.1 kg)    Body mass index is 35.26 kg/m.  Performance status (ECOG): 1 - Symptomatic but completely ambulatory  PHYSICAL EXAM:   GENERAL:alert, no distress and comfortable SKIN: skin color, texture, turgor are normal, no rashes or significant lesions EYES: normal, Conjunctiva are pink and non-injected, sclera clear OROPHARYNX:no exudate, no erythema and lips, buccal mucosa, and tongue normal  NECK: supple, thyroid normal size, non-tender, without nodularity LYMPH:  no palpable lymphadenopathy in the cervical, axillary or inguinal LUNGS: clear to auscultation and percussion with normal breathing effort HEART: regular rate & rhythm and no murmurs and no lower extremity edema ABDOMEN:abdomen soft, non-tender and normal bowel  sounds Musculoskeletal:no cyanosis of digits and no clubbing  NEURO: alert & oriented x 3 with fluent speech, no focal motor/sensory deficits  LABORATORY DATA:  I have reviewed the data as listed    Component Value Date/Time   NA 140 04/17/2022 0000   K 4.3 04/17/2022 0000   CL 108 04/17/2022 0000   CO2 23 (A) 04/17/2022 0000   GLUCOSE 139 (H) 11/26/2021 0110   BUN 27 (A) 04/17/2022 0000   CREATININE 0.9 04/17/2022 0000   CREATININE 0.82 11/26/2021 0110   CALCIUM 10.3 04/17/2022 0000   PROT 8.1 05/04/2022 1248   ALBUMIN 3.9 05/04/2022 1248   AST 98 (H) 05/04/2022 1248   ALT 113 (H) 05/04/2022 1248   ALKPHOS 72 05/04/2022 1248   BILITOT 0.6 05/04/2022 1248   GFRNONAA >60 11/26/2021 0110    No results found for: "SPEP", "UPEP"  Lab Results  Component Value Date   WBC 9.6 04/17/2022   NEUTROABS 4.99 04/17/2022   HGB 13.7 04/17/2022   HCT 41 04/17/2022   MCV 100.3 (H) 11/26/2021   PLT 182 04/17/2022      Chemistry      Component Value Date/Time   NA 140 04/17/2022 0000   K 4.3 04/17/2022 0000   CL 108 04/17/2022 0000   CO2 23 (A) 04/17/2022 0000   BUN 27 (A) 04/17/2022 0000   CREATININE 0.9 04/17/2022 0000   CREATININE 0.82 11/26/2021 0110   GLU 141 04/17/2022 0000      Component Value Date/Time   CALCIUM 10.3 04/17/2022 0000   ALKPHOS 72 05/04/2022 1248   AST 98 (H) 05/04/2022 1248   ALT 113 (H) 05/04/2022 1248   BILITOT 0.6 05/04/2022 1248       RADIOGRAPHIC STUDIES: I have personally reviewed the radiological images as listed and agreed with  the findings in the report. No results found.

## 2022-05-04 ENCOUNTER — Other Ambulatory Visit (INDEPENDENT_AMBULATORY_CARE_PROVIDER_SITE_OTHER): Payer: Medicare Other

## 2022-05-04 DIAGNOSIS — R932 Abnormal findings on diagnostic imaging of liver and biliary tract: Secondary | ICD-10-CM | POA: Diagnosis not present

## 2022-05-04 DIAGNOSIS — K760 Fatty (change of) liver, not elsewhere classified: Secondary | ICD-10-CM

## 2022-05-04 DIAGNOSIS — R748 Abnormal levels of other serum enzymes: Secondary | ICD-10-CM

## 2022-05-04 LAB — HEPATIC FUNCTION PANEL
ALT: 113 U/L — ABNORMAL HIGH (ref 0–35)
AST: 98 U/L — ABNORMAL HIGH (ref 0–37)
Albumin: 3.9 g/dL (ref 3.5–5.2)
Alkaline Phosphatase: 72 U/L (ref 39–117)
Bilirubin, Direct: 0.1 mg/dL (ref 0.0–0.3)
Total Bilirubin: 0.6 mg/dL (ref 0.2–1.2)
Total Protein: 8.1 g/dL (ref 6.0–8.3)

## 2022-05-06 LAB — AFP TUMOR MARKER: AFP-Tumor Marker: 5.6 ng/mL

## 2022-05-08 ENCOUNTER — Ambulatory Visit (INDEPENDENT_AMBULATORY_CARE_PROVIDER_SITE_OTHER): Payer: Medicare Other | Admitting: Gastroenterology

## 2022-05-08 ENCOUNTER — Other Ambulatory Visit (INDEPENDENT_AMBULATORY_CARE_PROVIDER_SITE_OTHER): Payer: Medicare Other

## 2022-05-08 ENCOUNTER — Encounter: Payer: Self-pay | Admitting: Gastroenterology

## 2022-05-08 VITALS — BP 112/82 | HR 68 | Ht 62.0 in | Wt 192.0 lb

## 2022-05-08 DIAGNOSIS — K259 Gastric ulcer, unspecified as acute or chronic, without hemorrhage or perforation: Secondary | ICD-10-CM

## 2022-05-08 DIAGNOSIS — K7460 Unspecified cirrhosis of liver: Secondary | ICD-10-CM | POA: Diagnosis not present

## 2022-05-08 DIAGNOSIS — K5900 Constipation, unspecified: Secondary | ICD-10-CM

## 2022-05-08 DIAGNOSIS — D509 Iron deficiency anemia, unspecified: Secondary | ICD-10-CM | POA: Diagnosis not present

## 2022-05-08 LAB — IBC + FERRITIN
Ferritin: 271.8 ng/mL (ref 10.0–291.0)
Iron: 93 ug/dL (ref 42–145)
Saturation Ratios: 26.9 % (ref 20.0–50.0)
TIBC: 345.8 ug/dL (ref 250.0–450.0)
Transferrin: 247 mg/dL (ref 212.0–360.0)

## 2022-05-08 MED ORDER — TRULANCE 3 MG PO TABS: 3.0000 mg | ORAL_TABLET | Freq: Every day | ORAL | 0 refills | Status: DC

## 2022-05-08 MED ORDER — SUTAB 1479-225-188 MG PO TABS: 1.0000 | ORAL_TABLET | Freq: Once | ORAL | 0 refills | Status: AC

## 2022-05-08 NOTE — Patient Instructions (Addendum)
_______________________________________________________  If you are age 71 or older, your body mass index should be between 23-30. Your Body mass index is 35.12 kg/m. If this is out of the aforementioned range listed, please consider follow up with your Primary Care Provider.  If you are age 14 or younger, your body mass index should be between 19-25. Your Body mass index is 35.12 kg/m. If this is out of the aformentioned range listed, please consider follow up with your Primary Care Provider.   ________________________________________________________  The [facility] providers would like to encourage you to use [facility] to communicate with providers for non-urgent requests or questions.  Due to long hold times on the telephone, sending your provider a message by [facility] may be a faster and more efficient way to get a response.  Please allow 48 business hours for a response.  Please remember that this is for non-urgent requests.  _______________________________________________________  Dennis Bast have been scheduled for an endoscopy and colonoscopy. Please follow the written instructions given to you at your visit today. Please pick up your prep supplies at the pharmacy within the next 1-3 days. If you use inhalers (even only as needed), please bring them with you on the day of your procedure.  We have given you samples of the following medication to take: Trulance 3 mg: Take once daily     You will be contacted by [facility] in the next 2 days to arrange a Ultrasound.  The number on your caller ID will be [phone], please answer when they call.  If you have not heard from them in 2 days please call [phone] to schedule.    Please go to the lab in the basement of our building to have lab work done as you leave today. Hit "B" for basement when you get on the elevator.  When the doors open the lab is on your left.  We will call you with the results. Thank you.  Thank you for  entrusting me with your care and for choosing [facility], Dr. Bethlehem Cellar /

## 2022-05-08 NOTE — Progress Notes (Signed)
HPI :  71 year old here for a follow up for cirrhosis, iron deficiency, gastric ulcer. Recall she has a history of ITP, COPD, bipolar disorder, hypothyroidism, chronic pain on chronic narcotics.   Recall she has a longstanding history of ITP treated with splenectomy at age 43, has been on periodic steroids and rituximab for ITP in the past, sounds like she has responded to rituximab most recently.  She had some elevated liver enzymes at least for the past year or so from review of her chart, AST, ALT, alk phos elevated to mid to high 100s.  Her brother had cirrhosis from alcoholism.  The patient denies any significant alcohol use at all.  She states she weighs more now than she is ever had in the past, prednisone has put weight on her when needed for her ITP.    She had a CT scan when she was in the hospital in May, which showed fatty liver and suggested changes of cirrhosis.  She is never had jaundice, no history of variceal bleeding, no history of hepatic encephalopathy, no history of ascites.  Since her last visit she had a serologic work-up with this for chronic liver disease which was negative other than a mildly positive ANA but her IgG and smooth muscle antibody were negative.  Previous iron studies back in May showed an iron deficiency.  She is not immune to hepatitis B but she was immune to hepatitis A.  She had hepatitis B vaccine since have last seen her.  For iron deficiency and varices screening she underwent an EGD with me in July.  She had some esophagitis noticed, esophageal plaque, gastric ulcer.  Biopsies negative for H. pylori or dysplasia.  I had recommended a colonoscopy at the same time but she declined.  She had endorsed using a significant amount of Goody powder prior to the EGD.  She has completely stopped all NSAIDs.  She has been on omeprazole 40 mg twice daily for 1 month and then reduce to once daily thereafter.  She is compliant with that.  She denies any problems with her  abdomen.  Eating well, no reflux, no dysphagia.  She had no varices on that exam.  She has been compensated with her cirrhosis since have seen her.  She has been working on weight loss and is lost about 10 pounds since have last seen her.  She is now willing to do a colonoscopy after discussion of these issues.  She reports having chronic constipation secondary to chronic narcotics that she takes.  She is using oxycodone daily for back and knee pain.  She previously was using Linzess for her constipation which helped but she could not afford it.  She has been using stool softeners over-the-counter which can help slightly but she is still constipated.  She did not like taking MiraLAX in the past.  She does have COPD but is not on any oxygen.  She denies any cardiopulmonary symptoms that bother her currently.  She has been tobacco free for the past 2 years.   CT abdomen pelvis with contrast 11/25/21: EXAM: CT ABDOMEN AND PELVIS WITH CONTRAST   TECHNIQUE: Multidetector CT imaging of the abdomen and pelvis was performed using the standard protocol following bolus administration of intravenous contrast.   RADIATION DOSE REDUCTION: This exam was performed according to the departmental dose-optimization program which includes automated exposure control, adjustment of the mA and/or kV according to patient size and/or use of iterative reconstruction technique.   CONTRAST:  153m  OMNIPAQUE IOHEXOL 300 MG/ML  SOLN   COMPARISON:  None Available.   FINDINGS: Evaluation of this exam is limited due to respiratory motion artifact.   Lower chest: The visualized lung bases are clear. There is coronary vascular calcification.   No intra-abdominal free air or free fluid.   Hepatobiliary: Fatty liver. Slight irregularity of the liver contour suspicious for early cirrhosis. No intrahepatic biliary ductal dilatation. Faint subcentimeter right hepatic hypodense focus is too small to characterize. There is  cholecystectomy. No retained calcified stone noted in the central CBD.   Pancreas: Unremarkable. No pancreatic ductal dilatation or surrounding inflammatory changes.   Spleen: Splenectomy.   Adrenals/Urinary Tract: The adrenal glands are unremarkable. There is no hydronephrosis on either side. There is symmetric enhancement and excretion of contrast by both kidneys. The visualized ureters and urinary bladder appear unremarkable.   Stomach/Bowel: Several small scattered sigmoid diverticula without active inflammatory changes. There is no bowel obstruction or active inflammation. The appendix is normal.   Vascular/Lymphatic: Advanced aortoiliac atherosclerotic disease. The IVC is unremarkable no portal venous gas. There is no adenopathy.   Reproductive: Hysterectomy.  No adnexal masses.   Other: None   Musculoskeletal: Degenerative changes of spine. No acute osseous pathology.   IMPRESSION: 1. No acute intra-abdominal or pelvic pathology. 2. Fatty liver with findings suspicious for cirrhosis. 3. Small scattered sigmoid diverticula. No bowel obstruction. Normal appendix. 4. Aortic Atherosclerosis (ICD10-I70.0).     EGD 02/03/22: - LA Grade A esophagitis was found focally at the GEJ. - A plaque was found in the lower third of the esophagus, 35 cm from the incisors, could not be lavaged off. Biopsies were taken with a cold forceps for histology. - The exam of the esophagus was otherwise normal. No varices - Diffuse mild inflammation characterized by erythema, friability and granularity was found in the entire examined stomach. - One non-bleeding gastric ulcer with no stigmata of bleeding was found in the prepyloric region of the stomach, with some associated inflammatory nodularity. The lesion was 3 mm in largest dimension. Biopsies were taken from the periphery of the lesion with a cold forceps for histology. - The exam of the stomach was otherwise normal. No varices -  Biopsies were taken with a cold forceps in the gastric body, at the incisura and in the gastric antrum for Helicobacter pylori testing. - The examined duodenum was normal.  1. Surgical [P], gastric antrum (ulcer) REACTIVE GASTROPATHY NEGATIVE FOR EROSION OR ULCERATION NEGATIVE FOR H. PYLORI, INTESTINAL METAPLASIA, DYSPLASIA AND CARCINOMA 2. Surgical [P], gastric antrum and gastric body REACTIVE GASTROPATHY WITH MINIMAL CHRONIC GASTRITIS NEGATIVE FOR H. PYLORI, INTESTINAL METAPLASIA, DYSPLASIA AND CARCINOMA 3. 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   Hypotension    Osteopenia after menopause 03/21/2021   Stroke ([place])    Tobacco abuse 03/21/2021   Vitamin D deficiency      Past Surgical History:  Procedure Laterality Date   ABDOMINAL HYSTERECTOMY     BREAST LUMPECTOMY Right 01/29/2017   for benign disease   spleenectomy  SPLENECTOMY     Family History  Problem Relation Age of Onset   Brain cancer Brother    Colon cancer Neg Hx    Rectal cancer Neg Hx    Stomach cancer Neg Hx    Social History   Tobacco Use   Smoking status: Former    Types: Cigarettes   Smokeless tobacco: Never  Vaping Use   Vaping Use: Every day  Substance Use Topics   Alcohol use: Never   Drug use: Never   Current Outpatient Medications  Medication Sig Dispense Refill   alendronate (FOSAMAX) 70 MG tablet Take 70 mg by mouth every Friday.     Biotin w/ Vitamins C & E (HAIR/SKIN/NAILS PO) Take 1  capsule by mouth daily.     Budeson-Glycopyrrol-Formoterol (BREZTRI AEROSPHERE) 160-9-4.8 MCG/ACT AERO 2 puffs     busPIRone (BUSPAR) 15 MG tablet Take 15 mg by mouth 2 (two) times daily.     Calcium Carb-Cholecalciferol (CALCIUM + D3 PO) Take 1 tablet by mouth daily.     Ferrous Sulfate (IRON PO) Take by mouth. 9 mg two times daily     furosemide (LASIX) 20 MG tablet Take 20 mg by mouth daily.     hydrOXYzine (ATARAX) 25 MG tablet Take 1 tablet by mouth 2 (two) times daily as needed for anxiety.     ibuprofen (ADVIL) 200 MG tablet Take 600 mg by mouth every 6 (six) hours as needed for mild pain or headache.     ipratropium-albuterol (DUONEB) 0.5-2.5 (3) MG/3ML SOLN Inhale 3 mLs into the lungs every 6 (six) hours as needed for shortness of breath.     levothyroxine (SYNTHROID) 50 MCG tablet 1 tablet in the morning on an empty stomach     metFORMIN (GLUCOPHAGE-XR) 500 MG 24 hr tablet Take 500 mg by mouth daily.     midodrine (PROAMATINE) 10 MG tablet Take 1 tablet (10 mg total) by mouth 3 (three) times daily with meals. 90 tablet 3   Multiple Vitamin (MULTIVITAMIN) tablet Take 1 tablet by mouth daily.     NON FORMULARY Stool softer pt take 1 a day     Omega-3 Fatty Acids (OMEGA 3 PO) Take 1 capsule by mouth daily.     omeprazole (PRILOSEC) 40 MG capsule Take 1 capsule (40 mg total) by mouth 2 (two) times daily. 90 capsule 3   ondansetron (ZOFRAN) 4 MG tablet Take 1 tablet (4 mg total) by mouth every 4 (four) hours as needed for nausea. 90 tablet 3   Oxycodone HCl 20 MG TABS 1 tablet as needed     Plecanatide (TRULANCE) 3 MG TABS Take 3 mg by mouth daily. Exp: 08-2023, LOT: [id number] 9 tablet 0   pramipexole (MIRAPEX) 0.5 MG tablet Take 0.5 mg by mouth at bedtime.     QUEtiapine (SEROQUEL) 200 MG tablet Take 200 mg by mouth at bedtime.     VITAMIN A PO Take 1 tablet by mouth daily.     No current facility-administered medications for this visit.   No Known Allergies   Review of Systems: All  systems reviewed and negative except where noted in HPI.   Lab Results  Component Value Date   ALT 113 (H) 05/04/2022   AST 98 (H) 05/04/2022   ALKPHOS 72 05/04/2022   BILITOT 0.6 05/04/2022    Lab Results  Component Value Date   CREATININE 0.9 04/17/2022   BUN 27 (A) 04/17/2022   NA 140 04/17/2022   K 4.3 04/17/2022  CL 108 04/17/2022   CO2 23 (A) 04/17/2022    Lab Results  Component Value Date   INR 1.3 (H) 11/25/2021   INR 1.2 11/25/2021   INR 1.2 02/06/2021    Lab Results  Component Value Date   WBC 9.6 04/17/2022   HGB 13.7 04/17/2022   HCT 41 04/17/2022   MCV 100.3 (H) 11/26/2021   PLT 182 04/17/2022     Physical Exam: BP 112/82   Pulse 68   Ht _0  (1.575 m)   Wt 192 lb (87.1 kg)   BMI 35.12 kg/m  Constitutional: Pleasant,well-developed, female in no acute distress. Neurological: Alert and oriented to person place and time.Marland Kitchen Psychiatric: Normal mood and affect. Behavior is normal.   ASSESSMENT: 71 y.o. female here for assessment of the following  1. Cirrhosis of liver without ascites, unspecified hepatic cirrhosis type ([place])   2. Gastric ulcer, unspecified chronicity, unspecified whether gastric ulcer hemorrhage or perforation present   3. Iron deficiency anemia, unspecified iron deficiency anemia type   4. Constipation, unspecified constipation type    As above, relatively recent diagnosis of cirrhosis this year.  Following work-up it appears she has cirrhosis from fatty liver, likely due to chronic steroid use for her ITP and her weight.  She has been working on weight loss and lost 10 pounds since have seen her.  Her transaminases are significantly improved from previous, she was in the 200s back over the summer but now AST 98, ALT 113.  She has no varices on EGD.  We discussed long-term risks of cirrhosis, risks for decompensation and HCC.  She is currently compensated.  She will work on weight loss and hope to minimize her steroid use for her ITP.   She is due for [facility] screening next month.  I will see her every 6 months in the office.  We discussed her iron deficiency, likely due to gastric ulcer in light of her NSAID use, she is completely stopped NSAIDs.  She is very overdue for colonoscopy and in light of this and her iron deficiency I recommended an optical colonoscopy, and a follow-up EGD to confirm mucosal healing on PPI and NSAID avoidance.  I discussed risks / benefits of these exams and anesthesia and she wants to proceed.  Otherwise we reviewed her constipation regimen.  Unfortunately Linzess is not covered by insurance although was helping her.  She did not respond well to MiraLAX or other over-the-counter options.  In light of her chronic narcotic use she needs a strong bowel regimen.  Had some free samples of Trulance in the office I gave her today.  She will use that once daily.  If this does not work for her we will consider other options.  We will need to discuss with her insurance company what is covered under her plan.  She needs to manage her constipation aggressively prior to her bowel prep.   PLAN: - RUQ [place] in November for [facility] screening - lab for TIBC / ferritin to ensure she has responded to oral iron therapy and treatment of gastric ulcer - samples of Trulance for constipation (Linzess was too expensive), will prescribe if covered by insurance - EGD / colonoscopy scheduled to be done at [facility] on 10/31  - counseled on cirrhosis, risks for  decompensation and / [place] - f/u 6 months  Jolly Mango, MD [facility]

## 2022-05-13 ENCOUNTER — Telehealth: Payer: Self-pay

## 2022-05-13 NOTE — Telephone Encounter (Signed)
Called and Lm for patient to call radiology to get scheduled for RUQ [place] in November. [phone]

## 2022-05-19 ENCOUNTER — Ambulatory Visit (AMBULATORY_SURGERY_CENTER): Payer: Medicare Other | Admitting: Gastroenterology

## 2022-05-19 ENCOUNTER — Encounter: Payer: Self-pay | Admitting: Gastroenterology

## 2022-05-19 VITALS — BP 110/68 | HR 85 | Temp 98.4°F | Resp 12 | Ht 62.0 in | Wt 192.0 lb

## 2022-05-19 DIAGNOSIS — D125 Benign neoplasm of sigmoid colon: Secondary | ICD-10-CM

## 2022-05-19 DIAGNOSIS — D509 Iron deficiency anemia, unspecified: Secondary | ICD-10-CM | POA: Diagnosis not present

## 2022-05-19 DIAGNOSIS — D122 Benign neoplasm of ascending colon: Secondary | ICD-10-CM | POA: Diagnosis not present

## 2022-05-19 DIAGNOSIS — K259 Gastric ulcer, unspecified as acute or chronic, without hemorrhage or perforation: Secondary | ICD-10-CM

## 2022-05-19 DIAGNOSIS — D123 Benign neoplasm of transverse colon: Secondary | ICD-10-CM

## 2022-05-19 MED ORDER — SODIUM CHLORIDE 0.9 % IV SOLN: 500.0000 mL | Freq: Once | INTRAVENOUS | Status: DC

## 2022-05-19 NOTE — Patient Instructions (Signed)
Information on polyps given to you today.  Await pathology results.  Resume previous diet and medications.    Avoid NSAIDS going forward.   YOU HAD AN ENDOSCOPIC PROCEDURE TODAY AT [facility]:   Refer to the procedure report that was given to you for any specific questions about what was found during the examination.  If the procedure report does not answer your questions, please call your gastroenterologist to clarify.  If you requested that your care partner not be given the details of your procedure findings, then the procedure report has been included in a sealed envelope for you to review at your convenience later.  YOU SHOULD EXPECT: Some feelings of bloating in the abdomen. Passage of more gas than usual.  Walking can help get rid of the air that was put into your GI tract during the procedure and reduce the bloating. If you had a lower endoscopy (such as a colonoscopy or flexible sigmoidoscopy) you may notice spotting of blood in your stool or on the toilet paper. If you underwent a bowel prep for your procedure, you may not have a normal bowel movement for a few days.  Please Note:  You might notice some irritation and congestion in your nose or some drainage.  This is from the oxygen used during your procedure.  There is no need for concern and it should clear up in a day or so.  SYMPTOMS TO REPORT IMMEDIATELY:  Following lower endoscopy (colonoscopy or flexible sigmoidoscopy):  Excessive amounts of blood in the stool  Significant tenderness or worsening of abdominal pains  Swelling of the abdomen that is new, acute  Fever of 100F or higher  Following upper endoscopy (EGD)  Vomiting of blood or coffee ground material  New chest pain or pain under the shoulder blades  Painful or persistently difficult swallowing  New shortness of breath  Fever of 100F or higher  Black, tarry-looking stools  For urgent or emergent issues, a gastroenterologist can be reached  at any hour by calling [phone]. Do not use MyChart messaging for urgent concerns.    DIET:  We do recommend a small meal at first, but then you may proceed to your regular diet.  Drink plenty of fluids but you should avoid alcoholic beverages for 24 hours.  ACTIVITY:  You should plan to take it easy for the rest of today and you should NOT DRIVE or use heavy machinery until tomorrow (because of the sedation medicines used during the test).    FOLLOW UP: Our staff will call the number listed on your records the next business day following your procedure.  We will call around 7:15- 8:00 am to check on you and address any questions or concerns that you may have regarding the information given to you following your procedure. If we do not reach you, we will leave a message.     If any biopsies were taken you will be contacted by phone or by letter within the next 1-3 weeks.  Please call us at [phone] if you have not heard about the biopsies in 3 weeks.    SIGNATURES/CONFIDENTIALITY: You and/or your care partner have signed paperwork which will be entered into your electronic medical record.  These signatures attest to the fact that that the information above on your After Visit Summary has been reviewed and is understood.  Full responsibility of the confidentiality of this discharge information lies with you and/or your care-partner.

## 2022-05-19 NOTE — Progress Notes (Signed)
Sedate, gd SR, tolerated procedure well, VSS, report to RN 

## 2022-05-19 NOTE — Op Note (Signed)
[place] Patient Name: Jean Kramer Procedure Date: 05/19/2022 3:08 PM MRN: [id number] Endoscopist: Remo Lipps P. Havery Moros , MD, [id number] Age: 71 Referring MD:  Date of Birth: 10/09/1950 Gender: Female Account #: [id number] Procedure:                Upper GI endoscopy Indications:              Follow-up of gastric ulcer - history of NSAID                            related gastric ulcer, EGD to confirm mucosal                            healing. Also with esophageal plaque on the last                            exam. History of cirrhosis Medicines:                Monitored Anesthesia Care Procedure:                Pre-Anesthesia Assessment:                           - Prior to the procedure, a History and Physical                            was performed, and patient medications and                            allergies were reviewed. The patient's tolerance of                            previous anesthesia was also reviewed. The risks                            and benefits of the procedure and the sedation                            options and risks were discussed with the patient.                            All questions were answered, and informed consent                            was obtained. Prior Anticoagulants: The patient has                            taken no anticoagulant or antiplatelet agents. ASA                            Grade Assessment: III - A patient with severe                            systemic disease. After reviewing the risks and  benefits, the patient was deemed in satisfactory                            condition to undergo the procedure.                           After obtaining informed consent, the endoscope was                            passed under direct vision. Throughout the                            procedure, the patient's blood pressure, pulse, and                            oxygen saturations were  monitored continuously. The                            GIF [id number] was introduced through the                            mouth, and advanced to the second part of duodenum.                            The upper GI endoscopy was accomplished without                            difficulty. The patient tolerated the procedure                            well. Scope In: Scope Out: Findings:                 Esophagogastric landmarks were identified: the                            Z-line was found at 38 cm, the gastroesophageal                            junction was found at 38 cm and the upper extent of                            the gastric folds was found at 38 cm from the                            incisors.                           The exam of the esophagus was otherwise normal. No                            varices.                           Patchy mildly erythematous mucosa was found in the  gastric antrum. Recently biopsied and no evidence                            of H pylori, no further biopsies obtained today.                           The exam of the stomach was otherwise normal. No                            varices.                           The examined duodenum was normal. Complications:            No immediate complications. Estimated blood loss:                            Minimal. Estimated Blood Loss:     Estimated blood loss was minimal. Impression:               - Esophagogastric landmarks identified.                           - Normal esophagus otherwise                           - Erythematous mucosa in the antrum.                           - Normal stomach otherwise                           - Normal examined duodenum.                           Overall good healing of gastric ulcer on PPI and                            avoiding NSAIDs Recommendation:           - Patient has a contact number available for                             emergencies. The signs and symptoms of potential                            delayed complications were discussed with the                            patient. Return to normal activities tomorrow.                            Written discharge instructions were provided to the                            patient.                           -  Resume previous diet.                           - Continue present medications.                           - Avoid NSAIDs moving forward [place]. Dicie Edelen, MD 05/19/2022 4:13:09 PM This report has been signed electronically.

## 2022-05-19 NOTE — Progress Notes (Signed)
Called to room to assist during endoscopic procedure.  Patient ID and intended procedure confirmed with present staff. Received instructions for my participation in the procedure from the performing physician.  

## 2022-05-19 NOTE — Op Note (Addendum)
[facility] Patient Name: Jean Kramer Procedure Date: 05/19/2022 3:07 PM MRN: [id number] Endoscopist: Remo Lipps P. Havery Moros , MD, [id number] Age: 71 Referring MD:  Date of Birth: Feb 01, 1951 Gender: Female Account #: [id number] Procedure:                Colonoscopy Indications:              history of Iron deficiency anemia - last                            colonoscopy > 10 years ago Medicines:                Monitored Anesthesia Care Procedure:                Pre-Anesthesia Assessment:                           - Prior to the procedure, a History and Physical                            was performed, and patient medications and                            allergies were reviewed. The patient's tolerance of                            previous anesthesia was also reviewed. The risks                            and benefits of the procedure and the sedation                            options and risks were discussed with the patient.                            All questions were answered, and informed consent                            was obtained. Prior Anticoagulants: The patient has                            taken no anticoagulant or antiplatelet agents. ASA                            Grade Assessment: III - A patient with severe                            systemic disease. After reviewing the risks and                            benefits, the patient was deemed in satisfactory                            condition to undergo the procedure.  After obtaining informed consent, the colonoscope                            was passed under direct vision. Throughout the                            procedure, the patient's blood pressure, pulse, and                            oxygen saturations were monitored continuously. The                            CF [id number] #[id number] was introduced through the anus                            and advanced to the the cecum,  identified by                            appendiceal orifice and ileocecal valve. The                            colonoscopy was technically difficult and complex                            due to restricted mobility of the colon. The                            patient tolerated the procedure well. The quality                            of the bowel preparation was adequate. The                            ileocecal valve, appendiceal orifice, and rectum                            were photographed. Scope In: 3:35:36 PM Scope Out: 4:07:55 PM Scope Withdrawal Time: 0 hours 22 minutes 10 seconds  Total Procedure Duration: 0 hours 32 minutes 19 seconds  Findings:                 The perianal and digital rectal examinations were                            normal.                           A 5 mm polyp was found in the ascending colon. The                            polyp was sessile. The polyp was removed with a                            cold snare. Resection and retrieval were complete.  Five sessile polyps were found in the transverse                            colon. The polyps were 3 to 5 mm in size. These                            polyps were removed with a cold snare. Resection                            and retrieval were complete.                           A 5 to 7 mm polyp was found in the sigmoid colon.                            The polyp was sessile. It was quite difficult to                            see, could not visualize it entirely in one view                            given angulated sigmoid colon with restricted                            mobility. The polyp was removed with a cold snare.                            Resection and retrieval were complete.                           A few small-mouthed diverticula were found in the                            sigmoid colon.                           There was restricted mobility of the sigmoid colon                             which prolonged the exam. The exam was otherwise                            without abnormality. Retroflexed views not obtained                            due to small size rectum. Complications:            No immediate complications. Estimated blood loss:                            Minimal. Estimated Blood Loss:     Estimated blood loss was minimal. Impression:               - One 5 mm polyp in  the ascending colon, removed                            with a cold snare. Resected and retrieved.                           - Five 3 to 5 mm polyps in the transverse colon,                            removed with a cold snare. Resected and retrieved.                           - One 5 to 7 mm polyp in the sigmoid colon, removed                            with a cold snare. Resected and retrieved.                           - Diverticulosis in the sigmoid colon.                           - The examination was otherwise normal.                           No cause for iron deficiency on this exam, which                            was most likely due to prior gastric ulcer / NSAID                            use Recommendation:           - Patient has a contact number available for                            emergencies. The signs and symptoms of potential                            delayed complications were discussed with the                            patient. Return to normal activities tomorrow.                            Written discharge instructions were provided to the                            patient.                           - Resume previous diet.                           - Continue present medications.                           -  Await pathology results. Remo Lipps P. Javaya [place], MD 05/19/2022 4:19:38 PM This report has been signed electronically.

## 2022-05-19 NOTE — Progress Notes (Signed)
History and Physical Interval Note: Patient here for EGD and colonoscopy to evaluate issues as below. See clinic visit 05/08/22 - no interval changes. Have discussed risks / benefits and she wishes to proceed.  05/19/2022 3:11 PM  Jean Kramer  has presented today for endoscopic procedure(s), with the diagnosis of  Encounter Diagnoses  Name Primary?   Gastric ulcer, unspecified chronicity, unspecified whether gastric ulcer hemorrhage or perforation present Yes   Iron deficiency anemia, unspecified iron deficiency anemia type   .  The various methods of evaluation and treatment have been discussed with the patient and/or family. After consideration of risks, benefits and other options for treatment, the patient has consented to  the endoscopic procedure(s).   The patient's history has been reviewed, patient examined, no change in status, stable for surgery.  I have reviewed the patient's chart and labs.  Questions were answered to the patient's satisfaction.    Jolly Mango, MD [facility]

## 2022-05-20 ENCOUNTER — Telehealth: Payer: Self-pay

## 2022-05-20 NOTE — Telephone Encounter (Signed)
Left message on answering machine. 

## 2022-05-25 ENCOUNTER — Encounter: Payer: Self-pay | Admitting: Gastroenterology

## 2022-06-15 ENCOUNTER — Ambulatory Visit (HOSPITAL_COMMUNITY)
Admission: RE | Admit: 2022-06-15 | Discharge: 2022-06-15 | Disposition: A | Discharge status: Home / Self Care | Payer: Medicare Other | Source: Ambulatory Visit | Attending: Gastroenterology | Admitting: Gastroenterology

## 2022-06-15 DIAGNOSIS — K7460 Unspecified cirrhosis of liver: Secondary | ICD-10-CM | POA: Diagnosis present

## 2022-06-17 LAB — HM MAMMOGRAPHY

## 2022-06-26 ENCOUNTER — Other Ambulatory Visit: Payer: Self-pay | Admitting: Gastroenterology

## 2022-06-26 DIAGNOSIS — K2970 Gastritis, unspecified, without bleeding: Secondary | ICD-10-CM

## 2022-08-03 ENCOUNTER — Other Ambulatory Visit: Payer: Self-pay | Admitting: Oncology

## 2022-08-03 ENCOUNTER — Inpatient Hospital Stay: Payer: Medicare Other | Attending: Oncology | Admitting: Oncology

## 2022-08-03 ENCOUNTER — Inpatient Hospital Stay: Payer: Medicare Other

## 2022-08-03 ENCOUNTER — Encounter: Payer: Self-pay | Admitting: Oncology

## 2022-08-03 VITALS — BP 97/86 | HR 86 | Temp 97.9°F | Resp 18 | Ht 62.0 in | Wt 191.1 lb

## 2022-08-03 DIAGNOSIS — D693 Immune thrombocytopenic purpura: Secondary | ICD-10-CM | POA: Insufficient documentation

## 2022-08-03 LAB — CBC WITH DIFFERENTIAL (CANCER CENTER ONLY)
Abs Immature Granulocytes: 0.02 10*3/uL (ref 0.00–0.07)
Basophils Absolute: 0.1 10*3/uL (ref 0.0–0.1)
Basophils Relative: 1 %
Eosinophils Absolute: 0.2 10*3/uL (ref 0.0–0.5)
Eosinophils Relative: 2 %
HCT: 39.3 % (ref 36.0–46.0)
Hemoglobin: 12.9 g/dL (ref 12.0–15.0)
Immature Granulocytes: 0 %
Lymphocytes Relative: 34 %
Lymphs Abs: 3 10*3/uL (ref 0.7–4.0)
MCH: 35.1 pg — ABNORMAL HIGH (ref 26.0–34.0)
MCHC: 32.8 g/dL (ref 30.0–36.0)
MCV: 107.1 fL — ABNORMAL HIGH (ref 80.0–100.0)
Monocytes Absolute: 1 10*3/uL (ref 0.1–1.0)
Monocytes Relative: 12 %
Neutro Abs: 4.5 10*3/uL (ref 1.7–7.7)
Neutrophils Relative %: 51 %
Platelet Count: 195 10*3/uL (ref 150–400)
RBC: 3.67 MIL/uL — ABNORMAL LOW (ref 3.87–5.11)
RDW: 14.6 % (ref 11.5–15.5)
WBC Count: 8.7 10*3/uL (ref 4.0–10.5)
nRBC: 0 % (ref 0.0–0.2)

## 2022-08-03 LAB — CMP (CANCER CENTER ONLY)
ALT: 113 U/L — ABNORMAL HIGH (ref 0–44)
AST: 142 U/L — ABNORMAL HIGH (ref 15–41)
Albumin: 3.7 g/dL (ref 3.5–5.0)
Alkaline Phosphatase: 67 U/L (ref 38–126)
Anion gap: 10 (ref 5–15)
BUN: 25 mg/dL — ABNORMAL HIGH (ref 8–23)
CO2: 24 mmol/L (ref 22–32)
Calcium: 9.7 mg/dL (ref 8.9–10.3)
Chloride: 103 mmol/L (ref 98–111)
Creatinine: 0.92 mg/dL (ref 0.44–1.00)
GFR, Estimated: >60 mL/min (ref >60)
Glucose, Bld: 119 mg/dL — ABNORMAL HIGH (ref 70–99)
Potassium: 4.2 mmol/L (ref 3.5–5.1)
Sodium: 137 mmol/L (ref 135–145)
Total Bilirubin: 0.8 mg/dL (ref 0.3–1.2)
Total Protein: 7.9 g/dL (ref 6.5–8.1)

## 2022-08-03 NOTE — Progress Notes (Signed)
Patient Care Team: [name], MD as PCP - General (Internal Medicine) [name], MD as Consulting Physician (Oncology)  Clinic Day: 08/03/22   Referring physician: [name], MD  ASSESSMENT & PLAN:   Assessment & Plan: History of ITP with multiple relapses.  The patient completed 4 cycles of weekly rituximab and treatment of her ITP in November 2022.  She was then completely weaned off of prednisone, and has remained in remission since that time.     History of splenectomy, twice.   Chronic leukocytosis.   Tobacco abuse. She has now quit, but is vaping.   History of lumpectomy in July 2018 which was benign. She is up to date on her annual mammograms.   Osteopenia, stable to improved. Repeat bone density is scheduled through Dr. Jannette Fogo.   Elevated liver transaminases, worse. She states that Dr. Jannette Fogo has evaluated her with imaging and labs and she was found to have fatty liver. It has now progressed to liver cirrhosis.   8.  Peptic ulcer disease.  She will continue follow up with the gastroenterologist.    Plan I will call her on her labs from today and continue periodic follow up in 4 months with CBC and CMP. We had her family member come back to the room and I answered all their questions. The patient understands the plans discussed today and is in agreement with them.  She knows to contact our office if she develops concerns prior to her next appointment.  I provided 25 minutes of face-to-face time during this encounter and > 50% was spent counseling as documented under my assessment and plan.   ADDENDUM: Her platelet count remains normal at 195,000. Her liver function tests are mildly worse, with an SGOT of 142 and SGPT of 113.  [name], MD  [facility] [address] [place] [address] Dept: [phone] Dept Fax: [phone]   No orders of the defined types were placed  in this encounter.     CHIEF COMPLAINT:  CC: A 72 year old female with history of chronic ITP here for 2 month evaluation  Current Treatment:  Surveillance  INTERVAL HISTORY:  Jean Kramer is here today for repeat clinical assessment for her history of chronic ITP. Patient states that she is well but has been experiencing occasional bleeding of her front teeth. She was sick with diarrhea and nausea recently. She has some slight bruising on her arms. She has several subcutaneous nodules 1 in right upper quadrant, 2 in the left upper quadrant, another subcutaneous nodule in the epigastrium, and her liver is enlarged and firm, including the left lobe, and mildly tender. It is well below the right costal margin. She has been diagnosed with liver cirrhosis. She seems distraught by the news but I have reassured her. I advised her to not take Tylenol or take a little as she can. Her labs today are pending. I will see her in 4 months 10/31/2022 with labs. She denies signs of infection such as sore throat, sinus drainage, cough, or urinary symptoms.  She denies fevers or recurrent chills. She denies pain. She denies nausea, vomiting, chest pain, dyspnea or cough. Her weight has been stable. She is accompanied by her family member.    Dr. Havery Moros EGD:  LA Grade A esophagitis was found focally at the GEJ. A plaque was found in the lower third of the esophagus, 35 cm from the incisors, could not be  lavaged off. Biopsies were taken with a cold forceps for histology. The exam of the esophagus was otherwise normal. No varices. Diffuse mild inflammation characterized by erythema, friability and granularity was found in the entire examined stomach. One non-bleeding gastric ulcer with no stigmata of bleeding was found in the prepyloric region of the stomach, with some associated inflammatory nodularity. The lesion was 3 mm in largest dimension. Biopsies were taken from the periphery of the lesion with a cold forceps for  histology. The exam of the stomach was otherwise normal. No varices. Biopsies were taken with a cold forceps in the gastric body, at the incisura and in the gastric antrum for Helicobacter pylori testing. The examined duodenum was normal.  I have reviewed the past medical history, past surgical history, social history and family history with the patient and they are unchanged from previous note.  ALLERGIES:  has No Known Allergies.  MEDICATIONS:  Current Outpatient Medications  Medication Sig Dispense Refill   docusate sodium (COLACE) 100 MG capsule Take 100 mg by mouth daily. 3 tabs at bedtime     levothyroxine (SYNTHROID) 50 MCG tablet Mylan levothyroxine     Multiple Vitamin (MULTIVITAMIN WITH MINERALS) TABS tablet Take 1 tablet by mouth daily.     alendronate (FOSAMAX) 70 MG tablet Take 70 mg by mouth every Friday.     Biotin w/ Vitamins C & E (HAIR/SKIN/NAILS PO) Take 1 capsule by mouth daily.     Budeson-Glycopyrrol-Formoterol (BREZTRI AEROSPHERE) 160-9-4.8 MCG/ACT AERO 2 puffs     busPIRone (BUSPAR) 15 MG tablet Take 15 mg by mouth 2 (two) times daily.     Calcium Carb-Cholecalciferol (CALCIUM + D3 PO) Take 1 tablet by mouth daily.     Ferrous Sulfate (IRON PO) Take by mouth. 9 mg two times daily     furosemide (LASIX) 20 MG tablet Take 20 mg by mouth daily.     hydrOXYzine (ATARAX) 25 MG tablet Take 1 tablet by mouth 2 (two) times daily as needed for anxiety.     ibuprofen (ADVIL) 200 MG tablet Take 600 mg by mouth every 6 (six) hours as needed for mild pain or headache. (Patient not taking: Reported on 05/19/2022)     ipratropium-albuterol (DUONEB) 0.5-2.5 (3) MG/3ML SOLN Inhale 3 mLs into the lungs every 6 (six) hours as needed for shortness of breath.     metFORMIN (GLUCOPHAGE-XR) 500 MG 24 hr tablet Take 500 mg by mouth daily.     midodrine (PROAMATINE) 10 MG tablet Take 1 tablet (10 mg total) by mouth 3 (three) times daily with meals. 90 tablet 3   Multiple Vitamin (MULTIVITAMIN)  tablet Take 1 tablet by mouth daily.     NON FORMULARY Stool softer pt take 1 a day     Omega-3 Fatty Acids (OMEGA 3 PO) Take 1 capsule by mouth daily.     omeprazole (PRILOSEC) 40 MG capsule Take 1 capsule (40 mg total) by mouth daily. 90 capsule 1   ondansetron (ZOFRAN) 4 MG tablet Take 1 tablet (4 mg total) by mouth every 4 (four) hours as needed for nausea. 90 tablet 3   Oxycodone HCl 20 MG TABS 1 tablet as needed     Plecanatide (TRULANCE) 3 MG TABS Take 3 mg by mouth daily. Exp: 08-2023, LOT: [id number] (Patient not taking: Reported on 08/03/2022) 9 tablet 0   pramipexole (MIRAPEX) 0.5 MG tablet Take 0.5 mg by mouth at bedtime.     QUEtiapine (SEROQUEL) 200 MG tablet Take 200 mg by  mouth at bedtime.     VITAMIN A PO Take 1 tablet by mouth daily.     No current facility-administered medications for this visit.    HISTORY OF PRESENT ILLNESS:   The patient Jean Kramer is a 72 year old female with a has a history of ITP originally diagnosed in the 37's.  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 She initially responded to that, but over the years has been on and off corticosteroids for relapsing ITP, and was also treated with danazol for a time.  Her ITP had been in remission for many years, but she does has had chronic leukocytosis.  She started smoking at age 11 and so she has been smoking approximately 1 pack per day for 49 years and continues to smoke despite numerous discussions about smoking cessation.   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  We recommended an ultrasound-guided biopsy which revealed fibroadenomatoid and fibrocystic changes, including cystic and micro papillary apocrine metaplasia, apocrine adenosis, stromal fibrosis, and foci of pseudo angiomatous stromal  hyperplasia and focal sclerosis.  It was recommended that she have excision of this area and that was performed in July by Dr. Melynda Ripple.  The final pathology revealed atrophic breast tissue with fibrocystic changes and foci of usual ductal hyperplasia but no intraductal papilloma, fibroadenoma, atypia or malignancy.  There was another lumpectomy sample which revealed micropapillary ductal hyperplasia, focal ectatic ducts, and patchy peri ductal chronic inflammation.  The other area of lumpectomy had morphologic changes similar to the original biopsy.  We had been seeing her regularly, but then she was lost to follow-up after May 2019. She had a diagnostic right mammogram in April 2020 which revealed evolving fat necrosis.   She was admitted to [facility] in July 2022 due to developing an upper GI bleed from taking BC powders.  We had advised her to discontinue these previously.  She switched to Tylenol for her pain.  While in the hospital, her platelet count dropped into the teens, and she was given steroids and IVIG.   She had extensive bruising. She was in the hospital for a total of 15 days and her platelet count had gone up to 63,000 at the time of discharge.  She was referred back to [place].  She was given a prescription for prednisone at the time of discharge, but Dr. Hinton Rao advised her not to take this, as her platelet count was normal.  When we saw her in early August her platelet count was up to 168,000.  She had leukocytosis with white count was 14,000.  Her bruises had resolved and she was doing well.   She was then admitted to [facility] in August due to severe thrombocytopenia with a platelet count less than 5,000.  She presented to the hospital after she removed a scab from her knee, and the wound continued to ooze blood.  She received Nplate injection x2, as well as 2 units of PRBCs and 1 unit of platelets.  Upon discharge, her platelet count had improved to 17,000.  She was advised  to discontinue aspirin 81 mg.  She once again had extensive bruising, but also noted shortness of breath with exertion, insomnia, and occasional dizziness and lightheadedness.  I saw her  for hospital follow-up and she had resolution of the leukocytosis and her platelet count was up to 41,000.  Chemistries revealed worsening elevation of the liver transaminases with an SGOT of 117 and an SGPT of 90.  She states that she has known fatty liver.  As her platelets remained low, she was started on prednisone 60 mg daily. We planned to monitor her CBC weekly and see her every 2 weeks.  Bilateral screening mammogram in August did not reveal any evidence of malignancy.   She initially had an excellent response to high-dose prednisone, but when we tapered her rapidly, she had recurrent severe thrombocytopenia.  We were unable to slowly taper the prednisone, so she went on to receive rituximab weekly for 4 weeks completed on her 17th with an excellent response.  She was rapidly tapered off of prednisone while receiving rituximab.  2 weeks ago prednisone was discontinued lately count remains normal at 151,000.    REVIEW OF SYSTEMS:  Review of Systems  Constitutional:  Negative for appetite change, chills, diaphoresis, fatigue, fever and unexpected weight change.  HENT:  Negative.  Negative for hearing loss, lump/mass, mouth sores, nosebleeds, sore throat, tinnitus, trouble swallowing and voice change.   Eyes: Negative.  Negative for eye problems and icterus.  Respiratory: Negative.  Negative for chest tightness, cough, hemoptysis, shortness of breath and wheezing.        Phlegm stuck in throat.  Cardiovascular: Negative.  Negative for chest pain, leg swelling and palpitations.  Gastrointestinal: Negative.  Negative for abdominal distention, abdominal pain, blood in stool, constipation, diarrhea, nausea, rectal pain and vomiting.  Endocrine: Negative.   Genitourinary: Negative.  Negative for bladder incontinence,  difficulty urinating, dyspareunia, dysuria, frequency, hematuria, menstrual problem, nocturia, pelvic pain, vaginal bleeding and vaginal discharge.   Musculoskeletal:  Negative for arthralgias, back pain, flank pain, gait problem, myalgias, neck pain and neck stiffness.  Skin: Negative.  Negative for itching, rash and wound.  Neurological:  Positive for headaches. Negative for dizziness, extremity weakness, gait problem, light-headedness, numbness, seizures and speech difficulty.  Hematological: Negative.  Negative for adenopathy. Does not bruise/bleed easily.  Psychiatric/Behavioral: Negative.  Negative for confusion, decreased concentration, depression, sleep disturbance and suicidal ideas. The patient is not nervous/anxious.    VITALS:  Blood pressure 97/86, pulse 86, temperature 97.9 F (36.6 C), temperature source Oral, resp. rate 18, height '5\' 2"'$  (1.575 m), weight 191 lb 1.6 oz (86.7 kg), SpO2 97 %.  Wt Readings from Last 3 Encounters:  08/03/22 191 lb 1.6 oz (86.7 kg)  05/19/22 192 lb (87.1 kg)  05/08/22 192 lb (87.1 kg)    Body mass index is 34.95 kg/m.  Performance status (ECOG): 1 - Symptomatic but completely ambulatory  PHYSICAL EXAM:  Physical Exam Vitals reviewed.  Constitutional:      General: She is not in acute distress.    Appearance: Normal appearance. She is well-developed and normal weight. She is not ill-appearing, toxic-appearing or diaphoretic.  HENT:     Head: Normocephalic and atraumatic.     Right Ear: Tympanic membrane, ear canal and external ear normal. There is no impacted cerumen.     Left Ear: Tympanic membrane, ear canal and external ear normal. There is no impacted cerumen.     Nose: Nose normal. No congestion or rhinorrhea.     Mouth/Throat:     Mouth: Mucous membranes are moist.     Pharynx: Oropharynx is clear. No oropharyngeal exudate or posterior oropharyngeal erythema.  Eyes:  General: Lids are normal. Vision grossly intact. No scleral  icterus.       Right eye: No discharge.        Left eye: No discharge.     Extraocular Movements: Extraocular movements intact.     Conjunctiva/sclera: Conjunctivae normal.     Pupils: Pupils are equal, round, and reactive to light.  Neck:     Vascular: No carotid bruit.  Cardiovascular:     Rate and Rhythm: Normal rate and regular rhythm.     Pulses: Normal pulses.     Heart sounds: Normal heart sounds. No murmur heard.    No friction rub. No gallop.  Pulmonary:     Effort: Pulmonary effort is normal. No respiratory distress.     Breath sounds: No stridor. No wheezing, rhonchi or rales.     Comments: Decreased breath sounds throughout.   Chest:     Chest wall: No tenderness.  Abdominal:     General: Bowel sounds are normal. There is no distension.     Palpations: Abdomen is soft. There is no hepatomegaly, splenomegaly or mass.     Tenderness: There is no abdominal tenderness. There is no right CVA tenderness, left CVA tenderness, guarding or rebound.     Hernia: No hernia is present.     Comments: Several subcutaneous nodules 1 in right upper quadrant and 2 in the left upper quadrant. Another subcutaneous nodule in the epigastrium  Her liver is enlarged and firm, including the left lobe, and mildly tender. It is well below the right costal margin.   Musculoskeletal:        General: No swelling, tenderness, deformity or signs of injury. Normal range of motion.     Cervical back: Normal range of motion and neck supple. No rigidity or tenderness.     Right lower leg: No edema.     Left lower leg: No edema.  Lymphadenopathy:     Cervical: No cervical adenopathy.     Right cervical: No superficial, deep or posterior cervical adenopathy.    Left cervical: No superficial, deep or posterior cervical adenopathy.     Upper Body:     Right upper body: No supraclavicular, axillary or pectoral adenopathy.     Left upper body: No supraclavicular, axillary or pectoral adenopathy.  Skin:     General: Skin is warm and dry.     Coloration: Skin is not jaundiced or pale.     Findings: No bruising, erythema, lesion or rash.  Neurological:     General: No focal deficit present.     Mental Status: She is alert and oriented to person, place, and time. Mental status is at baseline.     Cranial Nerves: No cranial nerve deficit.     Sensory: No sensory deficit.     Motor: No weakness.     Coordination: Coordination normal.     Gait: Gait normal.     Deep Tendon Reflexes: Reflexes normal.  Psychiatric:        Mood and Affect: Mood normal.        Behavior: Behavior normal. Behavior is cooperative.        Thought Content: Thought content normal.        Judgment: Judgment normal.    LABORATORY DATA:  I have reviewed the data as listed    Component Value Date/Time   NA 137 08/03/2022 1338   NA 140 04/17/2022 0000   K 4.2 08/03/2022 1338   CL 103 08/03/2022 1338  CO2 24 08/03/2022 1338   GLUCOSE 119 (H) 08/03/2022 1338   BUN 25 (H) 08/03/2022 1338   BUN 27 (A) 04/17/2022 0000   CREATININE 0.92 08/03/2022 1338   CALCIUM 9.7 08/03/2022 1338   PROT 7.9 08/03/2022 1338   ALBUMIN 3.7 08/03/2022 1338   AST 142 (H) 08/03/2022 1338   ALT 113 (H) 08/03/2022 1338   ALKPHOS 67 08/03/2022 1338   BILITOT 0.8 08/03/2022 1338   GFRNONAA >60 08/03/2022 1338    No results found for: "SPEP", "UPEP"  Lab Results  Component Value Date   WBC 8.7 08/03/2022   NEUTROABS 4.5 08/03/2022   HGB 12.9 08/03/2022   HCT 39.3 08/03/2022   MCV 107.1 (H) 08/03/2022   PLT 195 08/03/2022      Chemistry      Component Value Date/Time   NA 137 08/03/2022 1338   NA 140 04/17/2022 0000   K 4.2 08/03/2022 1338   CL 103 08/03/2022 1338   CO2 24 08/03/2022 1338   BUN 25 (H) 08/03/2022 1338   BUN 27 (A) 04/17/2022 0000   CREATININE 0.92 08/03/2022 1338   GLU 141 04/17/2022 0000      Component Value Date/Time   CALCIUM 9.7 08/03/2022 1338   ALKPHOS 67 08/03/2022 1338   AST 142 (H) 08/03/2022  1338   ALT 113 (H) 08/03/2022 1338   BILITOT 0.8 08/03/2022 1338     Component Ref Range & Units 05/04/2022 6 mo ago  AFP-Tumor Marker ng/mL 5.6 6.1 High  CM   Component Ref Range & Units 2 mo ago (05/08/22)  Iron 42 - 145 ug/dL 93  Transferrin 212.0 - 360.0 mg/dL 247.0  Saturation Ratios 20.0 - 50.0 % 26.9  Ferritin 10.0 - 291.0 ng/mL 271.8  TIBC 250.0 - 450.0 mcg/dL 345.8    /// RADIOGRAPHIC STUDIES: EXAM: 06/15/2022 ULTRASOUND ABDOMEN LIMITED RIGHT UPPER QUADRANT COMPARISON: CT abdomen pelvis Nov 25, 2021 FINDINGS: Gallbladder: Surgically absent Common bile duct: Diameter: 5.3 mm Liver: Increased coarsened echogenicity. Mild nodularity of the contour. No focal lesion. Portal vein is patent on color Doppler imaging with normal direction of blood flow towards the liver. Other: None. IMPRESSION: Increased coarsened echogenicity throughout the liver with mild nodularity of the contour. Findings can be seen in the setting of cirrhosis. No focal lesion.   Exam: 05/19/2022 Colonoscopy   I,Jasmine M Lassiter,acting as a scribe for [name], MD.,have documented all relevant documentation on the behalf of [name], MD,as directed by  [name], MD while in the presence of [name], MD.

## 2022-08-04 ENCOUNTER — Telehealth: Payer: Self-pay

## 2022-08-04 ENCOUNTER — Telehealth: Payer: Self-pay | Admitting: Gastroenterology

## 2022-08-04 NOTE — Telephone Encounter (Signed)
Left patient a detailed vm letting her know that she is not due for repeat RUQ [place] until May 2024 and we will remind her closer to that time. I advised pt that radiology scheduling can work around her availability. I advised pt to call back with any questions or concerns in the interim.

## 2022-08-04 NOTE — Telephone Encounter (Signed)
Called patient and notified her of her lab results.  

## 2022-08-04 NOTE — Telephone Encounter (Signed)
-----  Message from [name], MD sent at 08/03/2022  6:42 PM EST ----- Regarding: call Tell her platelets are 195,000 and blood count normal, even white cells. Chemistries look good except the liver tests are worse.  Send copy to Dr. Jannette Fogo

## 2022-08-04 NOTE — Telephone Encounter (Signed)
Patient is calling to have a liver scan scheduled. She is looking to have it done around March 4 or any Monday. Please advise

## 2022-09-25 IMAGING — CT CT HEAD W/O CM
4 series · 16 of 47 positions shown, 18 images · non-contrast
Comparison: 03/31/2006

CLINICAL DATA: Headache, suspected intracranial hemorrhage

EXAM:
CT HEAD WITHOUT CONTRAST
TECHNIQUE: Contiguous axial images were obtained from the base of the skull
through the vertex without intravenous contrast.

[Series 3: head without · axial · non-contrast · 0.46mm/px · z∈[+1510,+1630]mm · 7 of 34 slices shown, 9 images]
[im 5/34  brain]
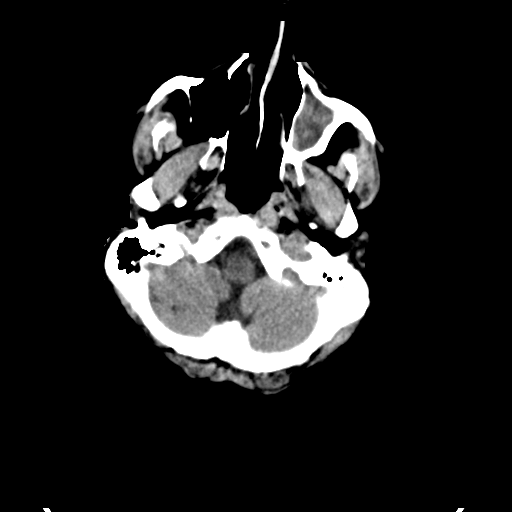
[im 5/34  bone]
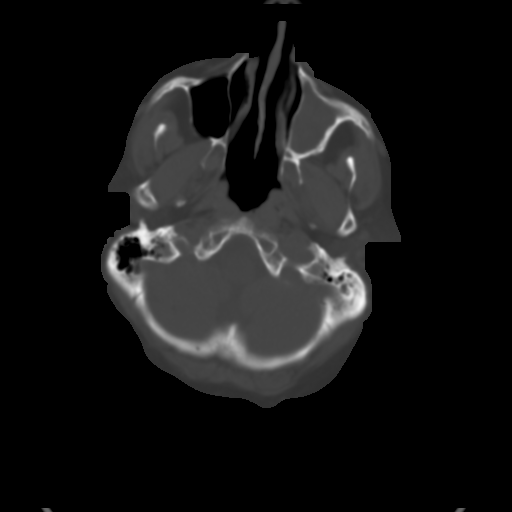
[im 9/34  brain]
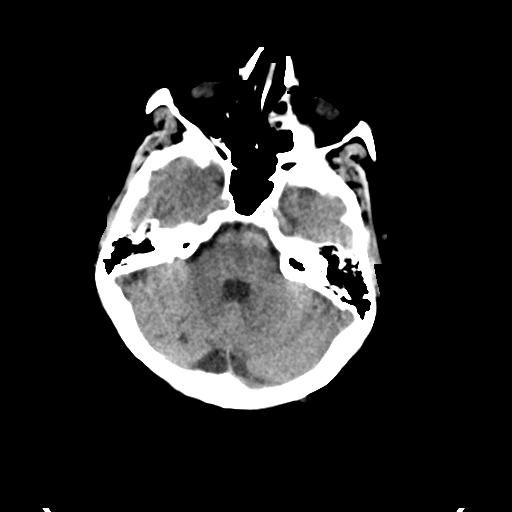
[im 13/34  brain]
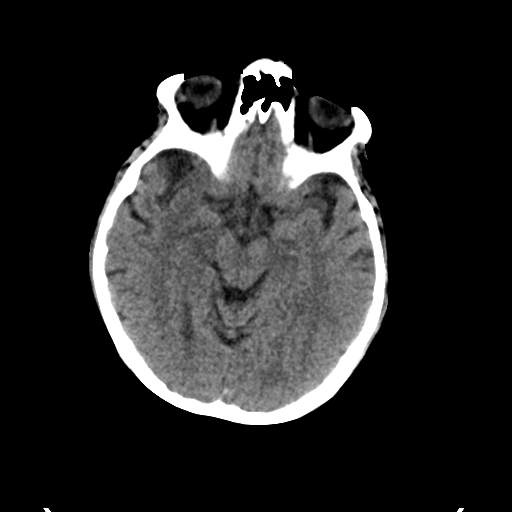
[im 17/34  brain]
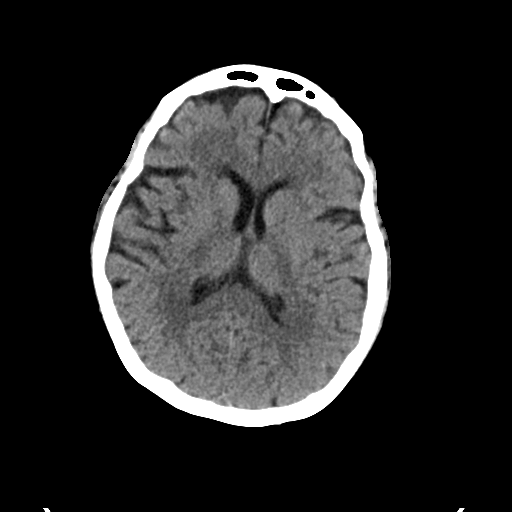
[im 21/34  brain]
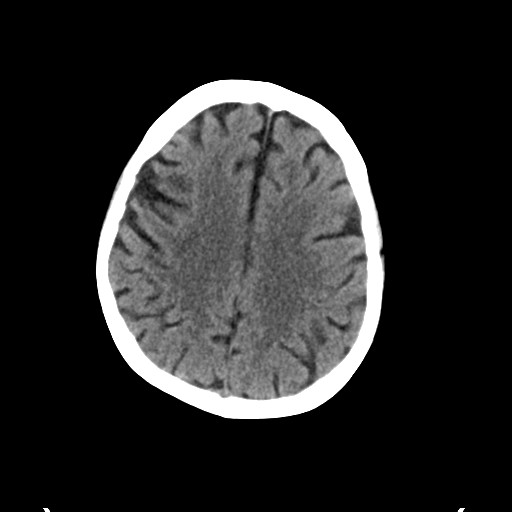
[im 21/34  bone]
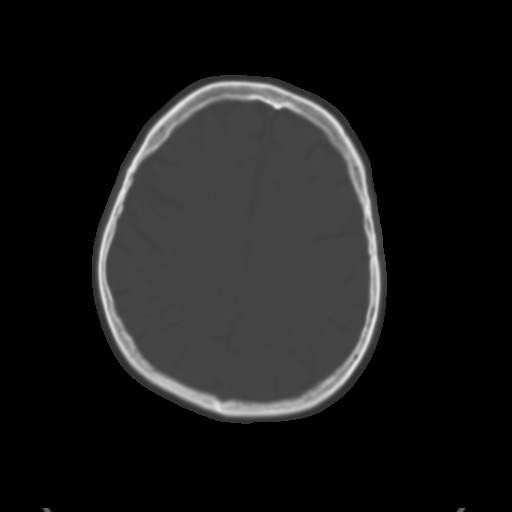
[im 25/34  brain]
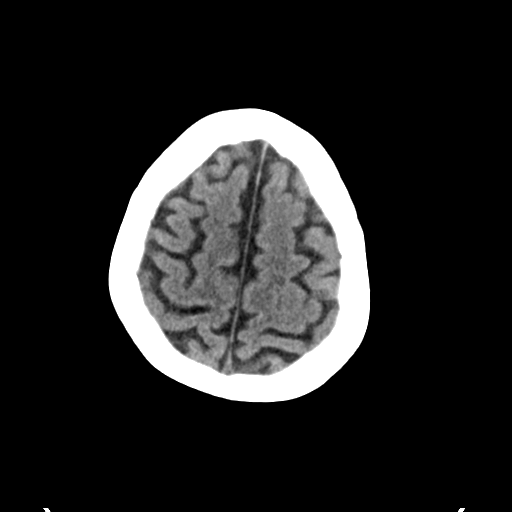
[im 29/34  brain]
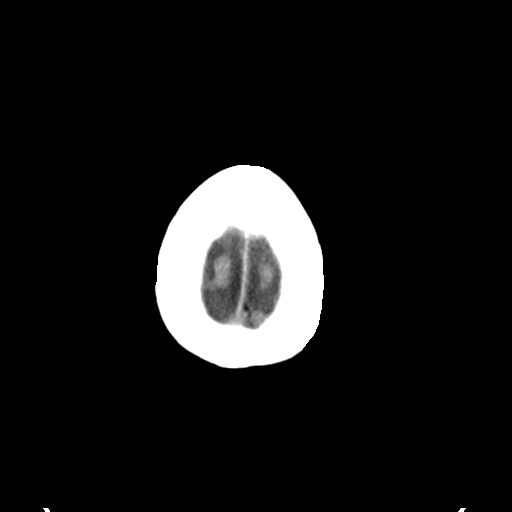

[Series 4: head bone · axial · 0.46mm/px · z∈[+1506,+1538]mm · 3 of 84 slices shown]
[im 9/84  bone]
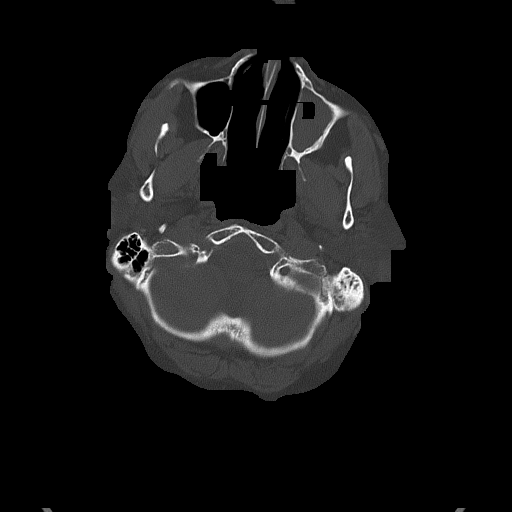
[im 17/84  bone]
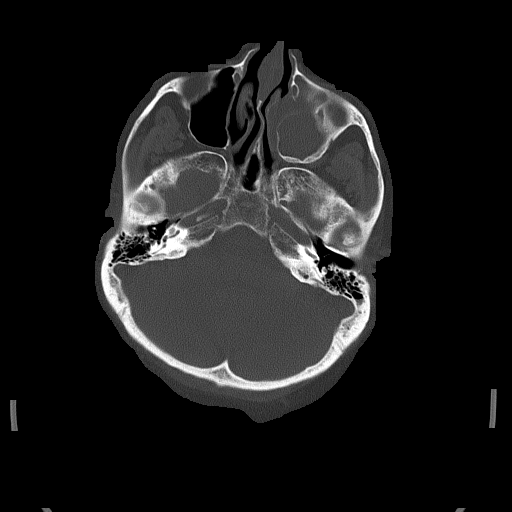
[im 25/84  bone]
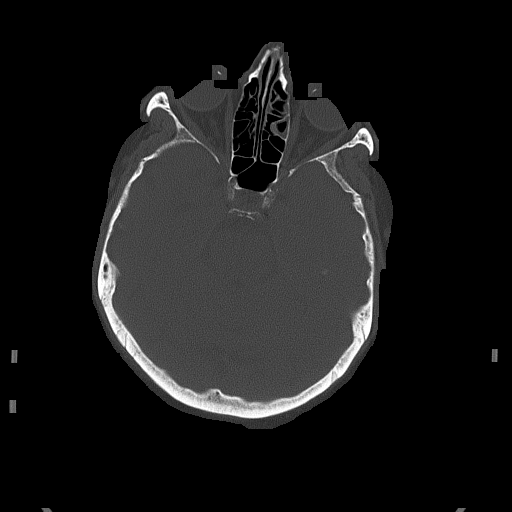

[Series 5: head without cor · coronal · non-contrast · 0.33mm/px · 3 of 61 slices shown]
[im 21/61  brain]
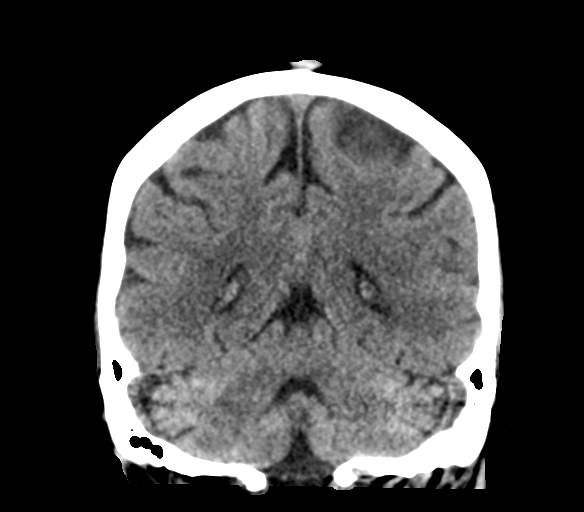
[im 27/61  brain]
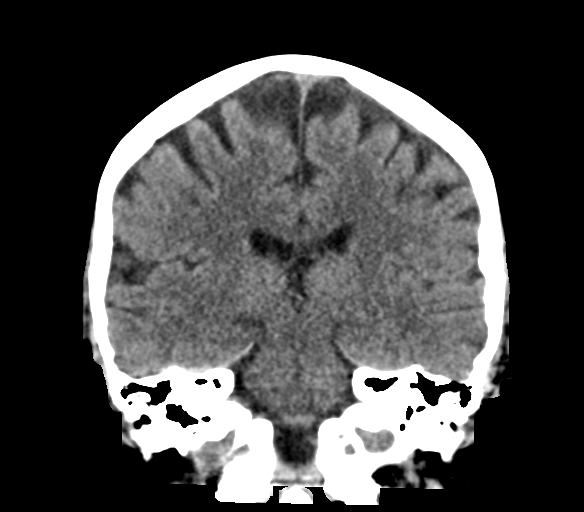
[im 34/61  brain]
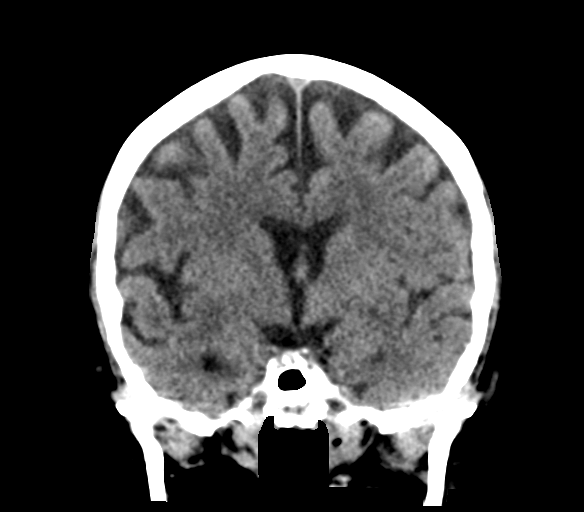

[Series 6: head without sag · sagittal · non-contrast · 0.33mm/px · 3 of 50 slices shown]
[im 17/50  brain]
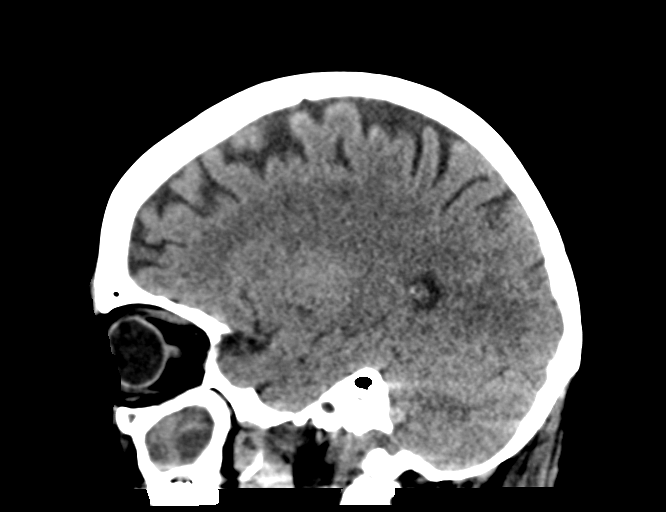
[im 25/50  brain]
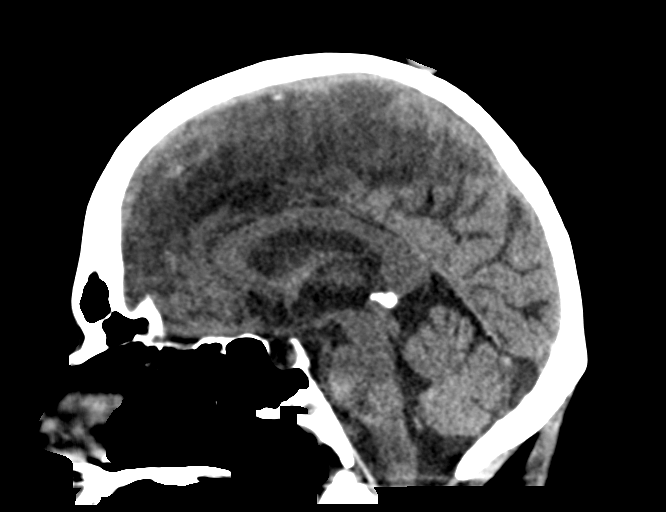
[im 33/50  brain]
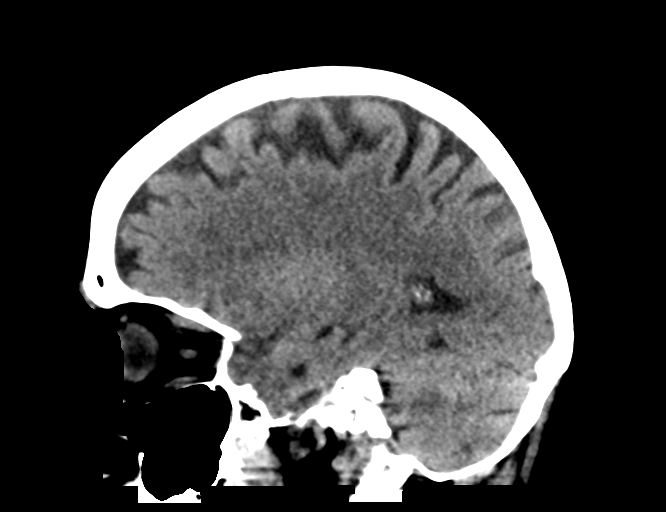

[16 of 47 positions shown; findings below may reference images not displayed]

FINDINGS: Brain: No evidence of acute infarction, hemorrhage, hydrocephalus,
extra-axial collection or mass lesion/mass effect. Mild atrophy.

Vascular: Atherosclerotic and physiologic intracranial
calcifications.

Skull: Normal. Negative for fracture or focal lesion.

Sinuses/Orbits: Dense opacification of the left maxillary sinus and
partial opacification of left ethmoid air cells. Orbits
unremarkable.

Other: 2cm parietal scalp lesion near the midline and1 cm right
parietooccipital scalp lesion, possibly sebaceous cyst but
nonspecific.
IMPRESSION: 1. No acute intracranial process.
2. Left maxillary and ethmoid sinus disease

## 2022-11-16 ENCOUNTER — Telehealth: Payer: Self-pay

## 2022-11-16 DIAGNOSIS — K7460 Unspecified cirrhosis of liver: Secondary | ICD-10-CM

## 2022-11-16 DIAGNOSIS — D509 Iron deficiency anemia, unspecified: Secondary | ICD-10-CM

## 2022-11-16 DIAGNOSIS — K259 Gastric ulcer, unspecified as acute or chronic, without hemorrhage or perforation: Secondary | ICD-10-CM

## 2022-11-16 NOTE — Telephone Encounter (Signed)
-----   Message from Cooper Render, CMA sent at 05/25/2022  8:57 AM EST ----- Regarding: LABS due in May -CBC, c-Met, INR, AFP due in May

## 2022-11-16 NOTE — Telephone Encounter (Signed)
Orders entered.  LM for patient to go to the lab. Letter mailed to pt

## 2022-11-23 ENCOUNTER — Other Ambulatory Visit (INDEPENDENT_AMBULATORY_CARE_PROVIDER_SITE_OTHER): Payer: Medicare Other

## 2022-11-23 DIAGNOSIS — D509 Iron deficiency anemia, unspecified: Secondary | ICD-10-CM

## 2022-11-23 DIAGNOSIS — K259 Gastric ulcer, unspecified as acute or chronic, without hemorrhage or perforation: Secondary | ICD-10-CM

## 2022-11-23 DIAGNOSIS — K7460 Unspecified cirrhosis of liver: Secondary | ICD-10-CM | POA: Diagnosis not present

## 2022-11-23 LAB — COMPREHENSIVE METABOLIC PANEL
ALT: 75 U/L — ABNORMAL HIGH (ref 0–35)
AST: 77 U/L — ABNORMAL HIGH (ref 0–37)
Albumin: 3.6 g/dL (ref 3.5–5.2)
Alkaline Phosphatase: 78 U/L (ref 39–117)
BUN: 21 mg/dL (ref 6–23)
CO2: 24 mEq/L (ref 19–32)
Calcium: 9.6 mg/dL (ref 8.4–10.5)
Chloride: 102 mEq/L (ref 96–112)
Creatinine, Ser: 1.03 mg/dL (ref 0.40–1.20)
GFR: 54.48 mL/min — ABNORMAL LOW (ref >60.00)
Glucose, Bld: 114 mg/dL — ABNORMAL HIGH (ref 70–99)
Potassium: 4.4 mEq/L (ref 3.5–5.1)
Sodium: 139 mEq/L (ref 135–145)
Total Bilirubin: 0.5 mg/dL (ref 0.2–1.2)
Total Protein: 7.9 g/dL (ref 6.0–8.3)

## 2022-11-23 LAB — CBC WITH DIFFERENTIAL/PLATELET
Basophils Absolute: 0.1 10*3/uL (ref 0.0–0.1)
Basophils Relative: 0.7 % (ref 0.0–3.0)
Eosinophils Absolute: 0.1 10*3/uL (ref 0.0–0.7)
Eosinophils Relative: 1.1 % (ref 0.0–5.0)
HCT: 36.5 % (ref 36.0–46.0)
Hemoglobin: 12.3 g/dL (ref 12.0–15.0)
Lymphocytes Relative: 34.2 % (ref 12.0–46.0)
Lymphs Abs: 3 10*3/uL (ref 0.7–4.0)
MCHC: 33.8 g/dL (ref 30.0–36.0)
MCV: 103.8 fl — ABNORMAL HIGH (ref 78.0–100.0)
Monocytes Absolute: 0.9 10*3/uL (ref 0.1–1.0)
Monocytes Relative: 10.4 % (ref 3.0–12.0)
Neutro Abs: 4.7 10*3/uL (ref 1.4–7.7)
Neutrophils Relative %: 53.6 % (ref 43.0–77.0)
Platelets: 250 10*3/uL (ref 150.0–400.0)
RBC: 3.52 Mil/uL — ABNORMAL LOW (ref 3.87–5.11)
RDW: 13.9 % (ref 11.5–15.5)
WBC: 8.7 10*3/uL (ref 4.0–10.5)

## 2022-11-23 LAB — PROTIME-INR
INR: 1.3 ratio — ABNORMAL HIGH (ref 0.8–1.0)
Prothrombin Time: 13.5 s — ABNORMAL HIGH (ref 9.6–13.1)

## 2022-11-24 ENCOUNTER — Other Ambulatory Visit: Payer: Self-pay

## 2022-11-24 DIAGNOSIS — K7460 Unspecified cirrhosis of liver: Secondary | ICD-10-CM

## 2022-11-24 LAB — AFP TUMOR MARKER: AFP-Tumor Marker: 5.6 ng/mL

## 2022-11-27 NOTE — Progress Notes (Signed)
Patient Care Team: Galvin Proffer, MD as PCP - General (Internal Medicine) Dellia Beckwith, MD as Consulting Physician (Oncology)  Clinic Day: 12/01/22  Referring physician: Galvin Proffer, MD  ASSESSMENT & PLAN:  Assessment & Plan: History of ITP with multiple relapses.  The patient completed 4 cycles of weekly rituximab and treatment of her ITP in November 2022.  She was then completely weaned off of prednisone, and has remained in remission since that time.     History of splenectomy, twice.   Chronic leukocytosis.   Tobacco abuse. She has now quit, but is vaping.   History of lumpectomy in July 2018 which was benign. She is up to date on her annual mammograms.   Osteopenia, stable to improved. Repeat bone density is scheduled through Dr. Ardelle Park.   Elevated liver transaminases, improved. Her fatty liver has now progressed to liver cirrhosis and she is monitored by Dr. Adela Lank.    8.  Peptic ulcer disease.  She will continue follow up with the gastroenterologist.   Plan She has also been diagnosed with liver cirrhosis, and EGD by Dr. Adela Lank last year revealed esophagitis and a gastric ulcer. 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 Her breathing has improved as she has stopped smoking cigarettes and rarely vapes. Her SGOT came down from 142 to 77 and SGPT came down from 113 to 75 as of 11/23/2022. Her GI doctor is checking ultrasounds of the liver every 6 months as well as alpha-fetoprotein, which have been normal. Today's labs are pending and I will call her with those results. She see's Dr. Ardelle Park monthly and I will see her back in 6 months with CBC, CMP, and schedule her annual mammogram in late November. She denies any excessive bruising or bleeding. The patient understands the plans discussed today and is in agreement with them.  She knows to contact our office if she develops  concerns prior to her next appointment.  I provided 20 minutes of face-to-face time during this encounter and > 50% was spent counseling as documented under my assessment and plan.   Dellia Beckwith, MD  [facility] AT [facility] [address] [place] [address] Dept: [phone] Dept Fax: [phone]   No orders of the defined types were placed in this encounter.     CHIEF COMPLAINT:  CC: A 72 year old female with history of chronic ITP   Current Treatment:  Surveillance  INTERVAL HISTORY:  Jean Kramer is here today for repeat clinical assessment for her history of chronic ITP. She has also been diagnosed with liver cirrhosis, and EGD by Dr. Adela Lank last year revealed esophagitis and a gastric ulcer. 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 Her breathing has improved as she has stopped smoking cigarettes and rarely vapes. Her SGOT came down from 142 to 77 and SGPT came down from 113 to 75 as of 11/23/2022. Her GI doctor is checking ultrasounds of the liver every 6 months as well as alpha fetoprotein, which have been normal. Today's labs are pending and I will call her with those results. She see's Dr. Ardelle Park monthly and I will see her back in 6 months with CBC, CMP, and schedule her annual mammogram in late November. She denies any excessive bruising or bleeding. She denies  signs of infection such as sore throat, sinus drainage, cough, or urinary symptoms.  She denies fevers or recurrent chills. She denies pain. She denies nausea, vomiting, chest pain, dyspnea or cough. Her appetite is good and her weight has increased 2 pounds over last 4 months .  I have reviewed the past medical history, past surgical history, social history and family history with the patient and they are unchanged from previous note.  ALLERGIES:  has No Known  Allergies.  MEDICATIONS:  Current Outpatient Medications  Medication Sig Dispense Refill   alendronate (FOSAMAX) 70 MG tablet Take 70 mg by mouth every Friday.     Biotin w/ Vitamins C & E (HAIR/SKIN/NAILS PO) Take 1 capsule by mouth daily.     Budeson-Glycopyrrol-Formoterol (BREZTRI AEROSPHERE) 160-9-4.8 MCG/ACT AERO 2 puffs     busPIRone (BUSPAR) 15 MG tablet Take 15 mg by mouth 2 (two) times daily.     Calcium Carb-Cholecalciferol (CALCIUM + D3 PO) Take 1 tablet by mouth daily.     docusate sodium (COLACE) 100 MG capsule Take 100 mg by mouth daily. 3 tabs at bedtime     Ferrous Sulfate (IRON PO) Take by mouth. 9 mg two times daily     furosemide (LASIX) 20 MG tablet Take 20 mg by mouth daily.     hydrOXYzine (ATARAX) 25 MG tablet Take 1 tablet by mouth 2 (two) times daily as needed for anxiety.     ibuprofen (ADVIL) 200 MG tablet Take 600 mg by mouth every 6 (six) hours as needed for mild pain or headache. (Patient not taking: Reported on 05/19/2022)     ipratropium-albuterol (DUONEB) 0.5-2.5 (3) MG/3ML SOLN Inhale 3 mLs into the lungs every 6 (six) hours as needed for shortness of breath.     levothyroxine (SYNTHROID) 50 MCG tablet Mylan levothyroxine     metFORMIN (GLUCOPHAGE-XR) 500 MG 24 hr tablet Take 500 mg by mouth daily.     midodrine (PROAMATINE) 10 MG tablet Take 1 tablet (10 mg total) by mouth 3 (three) times daily with meals. 90 tablet 3   Multiple Vitamin (MULTIVITAMIN WITH MINERALS) TABS tablet Take 1 tablet by mouth daily.     Multiple Vitamin (MULTIVITAMIN) tablet Take 1 tablet by mouth daily.     NON FORMULARY Stool softer pt take 1 a day     Omega-3 Fatty Acids (OMEGA 3 PO) Take 1 capsule by mouth daily.     omeprazole (PRILOSEC) 40 MG capsule TAKE 1 CAPSULE BY MOUTH DAILY 90 capsule 1   ondansetron (ZOFRAN) 4 MG tablet Take 1 tablet (4 mg total) by mouth every 4 (four) hours as needed for nausea. 90 tablet 3   Oxycodone HCl 20 MG TABS 1 tablet as needed     Plecanatide  (TRULANCE) 3 MG TABS Take 3 mg by mouth daily. Exp: 08-2023, LOT: [id number] (Patient not taking: Reported on 08/03/2022) 9 tablet 0   pramipexole (MIRAPEX) 0.5 MG tablet Take 0.5 mg by mouth at bedtime.     QUEtiapine (SEROQUEL) 200 MG tablet Take 200 mg by mouth at bedtime.     VITAMIN A PO Take 1 tablet by mouth daily.     No current facility-administered medications for this visit.    HISTORY OF PRESENT ILLNESS:   The patient Teuila is a 72 year old female with a has a history of ITP originally diagnosed in the 22's.  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 She initially responded to that, but over the years has been on and off corticosteroids for relapsing ITP, and was also treated with danazol for a time.  Her ITP had been in remission for many years, but she does has had chronic leukocytosis.  She started smoking at age 14 and so she has been smoking approximately 1 pack per day for 49 years and continues to smoke despite numerous discussions about smoking cessation.   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  We recommended an ultrasound-guided biopsy which revealed fibroadenomatoid and fibrocystic changes, including cystic and micro papillary apocrine metaplasia, apocrine adenosis, stromal fibrosis, and foci of pseudo angiomatous stromal hyperplasia and focal sclerosis.  It was recommended that she have excision of this area and that was performed in July by Dr. Lars Mage.  The final pathology revealed atrophic breast tissue with fibrocystic changes and foci of usual ductal hyperplasia but no intraductal papilloma, fibroadenoma, atypia or malignancy.  There was another lumpectomy sample which revealed micropapillary ductal hyperplasia, focal ectatic ducts, and patchy  peri ductal chronic inflammation.  The other area of lumpectomy had morphologic changes similar to the original biopsy.  We had been seeing her regularly, but then she was lost to follow-up after May 2019. She had a diagnostic right mammogram in April 2020 which revealed evolving fat necrosis.   She was admitted to [facility] in July 2022 due to developing an upper GI bleed from taking BC powders.  We had advised her to discontinue these previously.  She switched to Tylenol for her pain.  While in the hospital, her platelet count dropped into the teens, and she was given steroids and IVIG.   She had extensive bruising. She was in the hospital for a total of 15 days and her platelet count had gone up to 63,000 at the time of discharge.  She was referred back to [place].  She was given a prescription for prednisone at the time of discharge, but Dr. Gilman Buttner advised her not to take this, as her platelet count was normal.  When we saw her in early August her platelet count was up to 168,000.  She had leukocytosis with white count was 14,000.  Her bruises had resolved and she was doing well.   She was then admitted to [facility] in August due to severe thrombocytopenia with a platelet count less than 5,000.  She presented to the hospital after she removed a scab from her knee, and the wound continued to ooze blood.  She received Nplate injection x2, as well as 2 units of PRBCs and 1 unit of platelets.  Upon discharge, her platelet count had improved to 17,000.  She was advised to discontinue aspirin 81 mg.  She once again had extensive bruising, but also noted shortness of breath with exertion, insomnia, and occasional dizziness and lightheadedness.  I saw her for hospital follow-up and she had resolution of the leukocytosis and her platelet count was up to 41,000.  Chemistries revealed worsening elevation of the liver transaminases with an SGOT of 117 and an SGPT of 90.  She states that she has known fatty  liver.  As her platelets remained low, she was started on prednisone 60 mg daily. We planned to monitor her CBC weekly and see her every 2 weeks.  Bilateral screening  mammogram in August did not reveal any evidence of malignancy.   She initially had an excellent response to high-dose prednisone, but when we tapered her rapidly, she had recurrent severe thrombocytopenia.  We were unable to slowly taper the prednisone, so she went on to receive rituximab weekly for 4 weeks completed on her 17th with an excellent response.  She was rapidly tapered off of prednisone while receiving rituximab.  2 weeks ago prednisone was discontinued lately count remains normal at 151,000.    She was diagnosed with liver cirrhosis in 2023.  Dr. Adela Lank EGD:  LA Grade A esophagitis was found focally at the GEJ. A plaque was found in the lower third of the esophagus, 35 cm from the incisors, could not be lavaged off. Biopsies were taken with a cold forceps for histology. The exam of the esophagus was otherwise normal. No varices. Diffuse mild inflammation characterized by erythema, friability and granularity was found in the entire examined stomach. One non-bleeding gastric ulcer with no stigmata of bleeding was found in the prepyloric region of the stomach, with some associated inflammatory nodularity. The lesion was 3 mm in largest dimension. Biopsies were taken from the periphery of the lesion with a cold forceps for histology. The exam of the stomach was otherwise normal. No varices. Biopsies were taken with a cold forceps in the gastric body, at the incisura and in the gastric antrum for Helicobacter pylori testing. The examined duodenum was normal.   REVIEW OF SYSTEMS:  Review of Systems  Constitutional:  Negative for appetite change, chills, diaphoresis, fatigue, fever and unexpected weight change.  HENT:  Negative.  Negative for hearing loss, lump/mass, mouth sores, nosebleeds, sore throat, tinnitus, trouble swallowing  and voice change.   Eyes: Negative.  Negative for eye problems and icterus.  Respiratory: Negative.  Negative for chest tightness, cough, hemoptysis, shortness of breath and wheezing.   Cardiovascular: Negative.  Negative for chest pain, leg swelling and palpitations.  Gastrointestinal: Negative.  Negative for abdominal distention, abdominal pain, blood in stool, constipation, diarrhea, nausea, rectal pain and vomiting.  Endocrine: Negative.   Genitourinary: Negative.  Negative for bladder incontinence, difficulty urinating, dyspareunia, dysuria, frequency, hematuria, menstrual problem, nocturia, pelvic pain, vaginal bleeding and vaginal discharge.   Musculoskeletal:  Negative for arthralgias, back pain, flank pain, gait problem, myalgias, neck pain and neck stiffness.       Right ankle pain  Skin: Negative.  Negative for itching, rash and wound.  Neurological: Negative.  Negative for dizziness, extremity weakness, gait problem, headaches, light-headedness, numbness, seizures and speech difficulty.  Hematological: Negative.  Negative for adenopathy. Does not bruise/bleed easily.  Psychiatric/Behavioral: Negative.  Negative for confusion, decreased concentration, depression, sleep disturbance and suicidal ideas. The patient is not nervous/anxious.    VITALS:  Blood pressure 116/63, pulse 91, temperature 98.3 F (36.8 C), temperature source Oral, resp. rate 18, height 5\' 2"  (1.575 m), weight 193 lb 1.6 oz (87.6 kg), SpO2 96 %.  Wt Readings from Last 3 Encounters:  12/01/22 193 lb 1.6 oz (87.6 kg)  08/03/22 191 lb 1.6 oz (86.7 kg)  05/19/22 192 lb (87.1 kg)    Body mass index is 35.32 kg/m.  Performance status (ECOG): 1 - Symptomatic but completely ambulatory  PHYSICAL EXAM:  Physical Exam Vitals reviewed.  Constitutional:      General: She is not in acute distress.    Appearance: Normal appearance. She is well-developed and normal weight. She is not ill-appearing, toxic-appearing or  diaphoretic.  HENT:  Head: Normocephalic and atraumatic.     Right Ear: Tympanic membrane, ear canal and external ear normal. There is no impacted cerumen.     Left Ear: Tympanic membrane, ear canal and external ear normal. There is no impacted cerumen.     Nose: Nose normal. No congestion or rhinorrhea.     Mouth/Throat:     Mouth: Mucous membranes are moist.     Pharynx: Oropharynx is clear. No oropharyngeal exudate or posterior oropharyngeal erythema.  Eyes:     General: Lids are normal. Vision grossly intact. No scleral icterus.       Right eye: No discharge.        Left eye: No discharge.     Extraocular Movements: Extraocular movements intact.     Conjunctiva/sclera: Conjunctivae normal.     Pupils: Pupils are equal, round, and reactive to light.  Neck:     Vascular: No carotid bruit.  Cardiovascular:     Rate and Rhythm: Normal rate and regular rhythm.     Pulses: Normal pulses.     Heart sounds: Normal heart sounds. No murmur heard.    No friction rub. No gallop.  Pulmonary:     Effort: Pulmonary effort is normal. No respiratory distress.     Breath sounds: No stridor. No wheezing, rhonchi or rales.  Chest:     Chest wall: No tenderness.  Abdominal:     General: Bowel sounds are normal. There is no distension.     Palpations: Abdomen is soft. There is no hepatomegaly, splenomegaly or mass.     Tenderness: There is no abdominal tenderness. There is no right CVA tenderness, left CVA tenderness, guarding or rebound.     Hernia: No hernia is present.     Comments: Her liver is mildly enlarged and firm, including the left lobe. It is well below the right costal margin.   Musculoskeletal:        General: No swelling, tenderness, deformity or signs of injury. Normal range of motion.     Cervical back: Normal range of motion and neck supple. No rigidity or tenderness.     Right lower leg: No edema.     Left lower leg: No edema.  Lymphadenopathy:     Cervical: No cervical  adenopathy.     Right cervical: No superficial, deep or posterior cervical adenopathy.    Left cervical: No superficial, deep or posterior cervical adenopathy.     Upper Body:     Right upper body: No supraclavicular, axillary or pectoral adenopathy.     Left upper body: No supraclavicular, axillary or pectoral adenopathy.  Skin:    General: Skin is warm and dry.     Coloration: Skin is not jaundiced or pale.     Findings: No bruising, erythema, lesion or rash.  Neurological:     General: No focal deficit present.     Mental Status: She is alert and oriented to person, place, and time. Mental status is at baseline.     Cranial Nerves: No cranial nerve deficit.     Sensory: No sensory deficit.     Motor: No weakness.     Coordination: Coordination normal.     Gait: Gait normal.     Deep Tendon Reflexes: Reflexes normal.  Psychiatric:        Mood and Affect: Mood normal.        Behavior: Behavior normal. Behavior is cooperative.        Thought Content: Thought content normal.  Judgment: Judgment normal.    LABORATORY DATA:  I have reviewed the data as listed    Component Value Date/Time   NA 137 12/01/2022 1328   NA 140 04/17/2022 0000   K 3.9 12/01/2022 1328   CL 102 12/01/2022 1328   CO2 25 12/01/2022 1328   GLUCOSE 113 (H) 12/01/2022 1328   BUN 19 12/01/2022 1328   BUN 27 (A) 04/17/2022 0000   CREATININE 0.90 12/01/2022 1328   CALCIUM 9.7 12/01/2022 1328   PROT 8.8 (H) 12/01/2022 1328   ALBUMIN 3.4 (L) 12/01/2022 1328   AST 112 (H) 12/01/2022 1328   ALT 102 (H) 12/01/2022 1328   ALKPHOS 85 12/01/2022 1328   BILITOT 0.4 12/01/2022 1328   GFRNONAA >60 12/01/2022 1328    No results found for: "SPEP", "UPEP"  Lab Results  Component Value Date   WBC 11.0 (H) 12/01/2022   NEUTROABS 6.1 12/01/2022   HGB 13.0 12/01/2022   HCT 39.5 12/01/2022   MCV 105.3 (H) 12/01/2022   PLT 197 12/01/2022   Component Ref Range & Units 4 d ago 11/23/22 6 mo ago 10 mo ago   AFP-Tumor Marker ng/mL 5.6 5.6 CM 6.1 High  CM   Component Ref Range & Units 4 d ago (11/23/22) 1 yr ago (11/25/21) 1 yr ago (11/25/21) 1 yr ago (02/06/21) 1 yr ago (02/05/21)  INR 0.8 - 1.0 ratio 1.3 High  1.3 High  R, CM 1.2 R, CM 1.2 R, CM 1.4 High  R, CM  Prothrombin Time 9.6 - 13.1 sec 13.5 High  15.8 High  R 15.3 High  R 14.9 R 16.7 High  R     Chemistry      Component Value Date/Time   NA 137 12/01/2022 1328   NA 140 04/17/2022 0000   K 3.9 12/01/2022 1328   CL 102 12/01/2022 1328   CO2 25 12/01/2022 1328   BUN 19 12/01/2022 1328   BUN 27 (A) 04/17/2022 0000   CREATININE 0.90 12/01/2022 1328   GLU 141 04/17/2022 0000      Component Value Date/Time   CALCIUM 9.7 12/01/2022 1328   ALKPHOS 85 12/01/2022 1328   AST 112 (H) 12/01/2022 1328   ALT 102 (H) 12/01/2022 1328   BILITOT 0.4 12/01/2022 1328      RADIOGRAPHIC STUDIES: EXAM: 06/15/2022 ULTRASOUND ABDOMEN LIMITED RIGHT UPPER QUADRANT COMPARISON: CT abdomen pelvis Nov 25, 2021 FINDINGS: Gallbladder: Surgically absent Common bile duct: Diameter: 5.3 mm Liver: Increased coarsened echogenicity. Mild nodularity of the contour. No focal lesion. Portal vein is patent on color Doppler imaging with normal direction of blood flow towards the liver. Other: None. IMPRESSION: Increased coarsened echogenicity throughout the liver with mild nodularity of the contour. Findings can be seen in the setting of cirrhosis. No focal lesion.     I,Jasmine M Lassiter,acting as a scribe for Dellia Beckwith, MD.,have documented all relevant documentation on the behalf of Dellia Beckwith, MD,as directed by  Dellia Beckwith, MD while in the presence of Dellia Beckwith, MD.

## 2022-11-30 ENCOUNTER — Other Ambulatory Visit: Payer: Self-pay | Admitting: Gastroenterology

## 2022-11-30 ENCOUNTER — Other Ambulatory Visit: Payer: 59

## 2022-11-30 ENCOUNTER — Ambulatory Visit: Payer: 59 | Admitting: Oncology

## 2022-11-30 DIAGNOSIS — K2970 Gastritis, unspecified, without bleeding: Secondary | ICD-10-CM

## 2022-12-01 ENCOUNTER — Inpatient Hospital Stay (INDEPENDENT_AMBULATORY_CARE_PROVIDER_SITE_OTHER): Payer: Medicare Other | Admitting: Oncology

## 2022-12-01 ENCOUNTER — Inpatient Hospital Stay: Payer: Medicare Other | Attending: Oncology

## 2022-12-01 ENCOUNTER — Other Ambulatory Visit: Payer: Self-pay | Admitting: Oncology

## 2022-12-01 ENCOUNTER — Encounter: Payer: Self-pay | Admitting: Oncology

## 2022-12-01 VITALS — BP 116/63 | HR 91 | Temp 98.3°F | Resp 18 | Ht 62.0 in | Wt 193.1 lb

## 2022-12-01 DIAGNOSIS — Z7984 Long term (current) use of oral hypoglycemic drugs: Secondary | ICD-10-CM | POA: Diagnosis not present

## 2022-12-01 DIAGNOSIS — Z1231 Encounter for screening mammogram for malignant neoplasm of breast: Secondary | ICD-10-CM

## 2022-12-01 DIAGNOSIS — Z9081 Acquired absence of spleen: Secondary | ICD-10-CM

## 2022-12-01 DIAGNOSIS — D693 Immune thrombocytopenic purpura: Secondary | ICD-10-CM

## 2022-12-01 DIAGNOSIS — D72829 Elevated white blood cell count, unspecified: Secondary | ICD-10-CM | POA: Insufficient documentation

## 2022-12-01 DIAGNOSIS — M8580 Other specified disorders of bone density and structure, unspecified site: Secondary | ICD-10-CM | POA: Diagnosis not present

## 2022-12-01 DIAGNOSIS — Z79899 Other long term (current) drug therapy: Secondary | ICD-10-CM | POA: Insufficient documentation

## 2022-12-01 DIAGNOSIS — Z87891 Personal history of nicotine dependence: Secondary | ICD-10-CM | POA: Diagnosis not present

## 2022-12-01 DIAGNOSIS — K279 Peptic ulcer, site unspecified, unspecified as acute or chronic, without hemorrhage or perforation: Secondary | ICD-10-CM | POA: Insufficient documentation

## 2022-12-01 DIAGNOSIS — Z1239 Encounter for other screening for malignant neoplasm of breast: Secondary | ICD-10-CM

## 2022-12-01 LAB — CBC WITH DIFFERENTIAL (CANCER CENTER ONLY)
Abs Immature Granulocytes: 0.05 10*3/uL (ref 0.00–0.07)
Basophils Absolute: 0.1 10*3/uL (ref 0.0–0.1)
Basophils Relative: 1 %
Eosinophils Absolute: 0.1 10*3/uL (ref 0.0–0.5)
Eosinophils Relative: 1 %
HCT: 39.5 % (ref 36.0–46.0)
Hemoglobin: 13 g/dL (ref 12.0–15.0)
Immature Granulocytes: 1 %
Lymphocytes Relative: 32 %
Lymphs Abs: 3.5 10*3/uL (ref 0.7–4.0)
MCH: 34.7 pg — ABNORMAL HIGH (ref 26.0–34.0)
MCHC: 32.9 g/dL (ref 30.0–36.0)
MCV: 105.3 fL — ABNORMAL HIGH (ref 80.0–100.0)
Monocytes Absolute: 1.2 10*3/uL — ABNORMAL HIGH (ref 0.1–1.0)
Monocytes Relative: 11 %
Neutro Abs: 6.1 10*3/uL (ref 1.7–7.7)
Neutrophils Relative %: 54 %
Platelet Count: 197 10*3/uL (ref 150–400)
RBC: 3.75 MIL/uL — ABNORMAL LOW (ref 3.87–5.11)
RDW: 14.5 % (ref 11.5–15.5)
WBC Count: 11 10*3/uL — ABNORMAL HIGH (ref 4.0–10.5)
nRBC: 0 % (ref 0.0–0.2)

## 2022-12-01 LAB — CMP (CANCER CENTER ONLY)
ALT: 102 U/L — ABNORMAL HIGH (ref 0–44)
AST: 112 U/L — ABNORMAL HIGH (ref 15–41)
Albumin: 3.4 g/dL — ABNORMAL LOW (ref 3.5–5.0)
Alkaline Phosphatase: 85 U/L (ref 38–126)
Anion gap: 10 (ref 5–15)
BUN: 19 mg/dL (ref 8–23)
CO2: 25 mmol/L (ref 22–32)
Calcium: 9.7 mg/dL (ref 8.9–10.3)
Chloride: 102 mmol/L (ref 98–111)
Creatinine: 0.9 mg/dL (ref 0.44–1.00)
GFR, Estimated: >60 mL/min (ref >60)
Glucose, Bld: 113 mg/dL — ABNORMAL HIGH (ref 70–99)
Potassium: 3.9 mmol/L (ref 3.5–5.1)
Sodium: 137 mmol/L (ref 135–145)
Total Bilirubin: 0.4 mg/dL (ref 0.3–1.2)
Total Protein: 8.8 g/dL — ABNORMAL HIGH (ref 6.5–8.1)

## 2022-12-02 ENCOUNTER — Telehealth: Payer: Self-pay | Admitting: Oncology

## 2022-12-02 NOTE — Telephone Encounter (Signed)
Patient has been scheduled. Aware of appt date and time    Scheduling Message Entered by Gery Pray H on 12/01/2022 at  2:18 PM Priority: Routine <No visit type provided>  Department: [facility]  Provider:  Scheduling Notes:  RT 6 months with labs and bilat mammogram late October, will find date of last one

## 2022-12-04 ENCOUNTER — Telehealth: Payer: Self-pay

## 2022-12-04 NOTE — Telephone Encounter (Signed)
Patient notified and voiced understanding.

## 2022-12-04 NOTE — Telephone Encounter (Signed)
-----   Message from Dellia Beckwith, MD sent at 12/04/2022  8:58 AM EDT ----- Regarding: call Tell her liver tests a little worse, be careful with the Tylenol. Platelets are 197,000, rest looks okay

## 2022-12-10 ENCOUNTER — Ambulatory Visit (HOSPITAL_COMMUNITY)
Admission: RE | Admit: 2022-12-10 | Discharge: 2022-12-10 | Disposition: A | Discharge status: Home / Self Care | Payer: Medicare Other | Source: Ambulatory Visit | Attending: Gastroenterology | Admitting: Gastroenterology

## 2022-12-10 DIAGNOSIS — K7460 Unspecified cirrhosis of liver: Secondary | ICD-10-CM | POA: Insufficient documentation

## 2023-03-01 ENCOUNTER — Ambulatory Visit (INDEPENDENT_AMBULATORY_CARE_PROVIDER_SITE_OTHER): Payer: Medicare Other | Admitting: Gastroenterology

## 2023-03-01 ENCOUNTER — Encounter: Payer: Self-pay | Admitting: Gastroenterology

## 2023-03-01 VITALS — BP 124/72 | HR 50 | Ht 62.0 in | Wt 192.0 lb

## 2023-03-01 DIAGNOSIS — K5909 Other constipation: Secondary | ICD-10-CM | POA: Diagnosis not present

## 2023-03-01 DIAGNOSIS — K7460 Unspecified cirrhosis of liver: Secondary | ICD-10-CM | POA: Diagnosis not present

## 2023-03-01 DIAGNOSIS — F1190 Opioid use, unspecified, uncomplicated: Secondary | ICD-10-CM

## 2023-03-01 DIAGNOSIS — Z8639 Personal history of other endocrine, nutritional and metabolic disease: Secondary | ICD-10-CM

## 2023-03-01 MED ORDER — LINACLOTIDE 290 MCG PO CAPS: 290.0000 ug | ORAL_CAPSULE | Freq: Every day | ORAL | 0 refills | Status: DC

## 2023-03-01 NOTE — Patient Instructions (Addendum)
Please contact your insurance provider and ask them which medication is covered under your plan for chronic constipation.  We have given you samples of the following medication to take: Linzess 290 mcg: take once daily 30 minutes before a meal  You will be due for an ultrasound of your liver and lab work in November. We will contact you when it is time to get scheduled.  Stop all NSAIDs and use Tylenol as needed instead.  Please follow up in 6 months.  Thank you for entrusting me with your care and for choosing [facility], Dr. Ileene Patrick  If your blood pressure at your visit was 140/90 or greater, please contact your primary care physician to follow up on this. ______________________________________________________  If you are age 72 or older, your body mass index should be between 23-30. Your Body mass index is 35.12 kg/m. If this is out of the aforementioned range listed, please consider follow up with your Primary Care Provider.  If you are age 72 or younger, your body mass index should be between 19-25. Your Body mass index is 35.12 kg/m. If this is out of the aformentioned range listed, please consider follow up with your Primary Care Provider.  ________________________________________________________  The [facility] providers would like to encourage you to use [facility] to communicate with providers for non-urgent requests or questions.  Due to long hold times on the telephone, sending your provider a message by [facility] may be a faster and more efficient way to get a response.  Please allow 48 business hours for a response.  Please remember that this is for non-urgent requests.  _______________________________________________________  Due to recent changes in healthcare laws, you may see the results of your imaging and laboratory studies on MyChart before your provider has had a chance to review them.  We understand that in some cases there may be results that are  confusing or concerning to you. Not all laboratory results come back in the same time frame and the provider may be waiting for multiple results in order to interpret others.  Please give [place] 48 hours in order for your provider to thoroughly review all the results before contacting the office for clarification of your results.

## 2023-03-01 NOTE — Progress Notes (Signed)
HPI :  72 year old here for a follow up for cirrhosis. Recall she has a history of ITP, COPD, bipolar disorder, hypothyroidism, chronic pain on chronic narcotics.   Cirrhosis history: Recall she has a longstanding history of ITP treated with splenectomy at age 7, has been on periodic steroids and rituximab for ITP in the past. She had some elevated liver enzymes a few years ago AST, ALT, alk phos elevated to mid to high 100s.  Her brother had cirrhosis from alcoholism.  The patient denies any significant alcohol use at all.   She had a CT scan when she was in the hospital in May 2023, which showed fatty liver and suggested changes of cirrhosis.  She is never had jaundice, no history of variceal bleeding, no history of hepatic encephalopathy, no history of ascites.  She had a serologic work-up with this for chronic liver disease which was negative other than a mildly positive ANA but her IgG and smooth muscle antibody were negative.  Previous iron studies back in May showed an iron deficiency.  She is not immune to hepatitis B but she was immune to hepatitis A.  She had hepatitis B vaccine.   For iron deficiency and varices screening she underwent an EGD with me in July.  She had some esophagitis noticed, esophageal plaque, gastric ulcer.  Biopsies negative for H. pylori or dysplasia.  I had recommended a colonoscopy at the same time but she declined.  She had endorsed using a significant amount of Goody powder prior to the EGD.  She has completely stopped all NSAIDs.   SINCE LAST VISIT: Since have last seen her she had an EGD and colonoscopy this past October.  Interval healing of the ulcer, no esophageal varices.  She had some polyps in her colon that were removed.  Recommended repeat EGD and colonoscopy in 3 years.  She has been doing pretty well since have last seen her.  She continues to have a transaminitis.  She states someone told her to stop using Tylenol and using NSAIDs for her headaches  and aches and pains.  She states she does not use them every day but uses infrequently.  We discussed that for a bit.  She is not using any alcohol.  She is on Mounjaro for diabetes for the past few months, she states she is tolerating it pretty well, her weight is stable, no weight loss.  Last ultrasound and labs was done in May which are stable.  She has not had any decompensating events in regards to her liver disease since have seen her  Otherwise, her main issue has been constipation.  She is currently using over-the-counter stool softeners and laxatives which produces a bowel movement once every 2 weeks.  She previously was on Linzess at 290 mcg/day and this helped her a lot and she states it worked well for her, however this has not been covered by insurance.  I gave her sample of Trulance at the last visit which she states helped, again could not get that for long-term.  She states MiraLAX has failed her in the past.  She continues to take oxycodone at least 3 tabs per day for chronic leg and back pain related to orthopedic issues.  She does not recall if she has tried Amitiza in the past.  She questions what can be covered by her insurance to treat her constipation.   Prior workup:  CT abdomen pelvis with contrast 11/25/21: EXAM: CT ABDOMEN AND PELVIS WITH CONTRAST  TECHNIQUE: Multidetector CT imaging of the abdomen and pelvis was performed using the standard protocol following bolus administration of intravenous contrast.   RADIATION DOSE REDUCTION: This exam was performed according to the departmental dose-optimization program which includes automated exposure control, adjustment of the mA and/or kV according to patient size and/or use of iterative reconstruction technique.   CONTRAST:  OMNIPAQUE IOHEXOL 300 MG/ML  SOLN   COMPARISON:  None Available.   FINDINGS: Evaluation of this exam is limited due to respiratory motion artifact.   Lower chest: The visualized lung bases  are clear. There is coronary vascular calcification.   No intra-abdominal free air or free fluid.   Hepatobiliary: Fatty liver. Slight irregularity of the liver contour suspicious for early cirrhosis. No intrahepatic biliary ductal dilatation. Faint subcentimeter right hepatic hypodense focus is too small to characterize. There is cholecystectomy. No retained calcified stone noted in the central CBD.   Pancreas: Unremarkable. No pancreatic ductal dilatation or surrounding inflammatory changes.   Spleen: Splenectomy.   Adrenals/Urinary Tract: The adrenal glands are unremarkable. There is no hydronephrosis on either side. There is symmetric enhancement and excretion of contrast by both kidneys. The visualized ureters and urinary bladder appear unremarkable.   Stomach/Bowel: Several small scattered sigmoid diverticula without active inflammatory changes. There is no bowel obstruction or active inflammation. The appendix is normal.   Vascular/Lymphatic: Advanced aortoiliac atherosclerotic disease. The IVC is unremarkable no portal venous gas. There is no adenopathy.   Reproductive: Hysterectomy.  No adnexal masses.   Other: None   Musculoskeletal: Degenerative changes of spine. No acute osseous pathology.   IMPRESSION: 1. No acute intra-abdominal or pelvic pathology. 2. Fatty liver with findings suspicious for cirrhosis. 3. Small scattered sigmoid diverticula. No bowel obstruction. Normal appendix. 4. Aortic Atherosclerosis (ICD10-I70.0).     EGD 02/03/22: - LA Grade A esophagitis was found focally at the GEJ. - A plaque was found in the lower third of the esophagus, 35 cm from the incisors, could not be lavaged off. Biopsies were taken with a cold forceps for histology. - The exam of the esophagus was otherwise normal. No varices - Diffuse mild inflammation characterized by erythema, friability and granularity was found in the entire examined stomach. - One non-bleeding  gastric ulcer with no stigmata of bleeding was found in the prepyloric region of the stomach, with some associated inflammatory nodularity. The lesion was 3 mm in largest dimension. Biopsies were taken from the periphery of the lesion with a cold forceps for histology. - The exam of the stomach was otherwise normal. No varices - Biopsies were taken with a cold forceps in the gastric body, at the incisura and in the gastric antrum for Helicobacter pylori testing. - The examined duodenum was normal.   1. Surgical [P], gastric antrum (ulcer) REACTIVE GASTROPATHY NEGATIVE FOR EROSION OR ULCERATION NEGATIVE FOR H. PYLORI, INTESTINAL METAPLASIA, DYSPLASIA AND CARCINOMA 2. Surgical [P], gastric antrum and gastric body REACTIVE GASTROPATHY WITH MINIMAL CHRONIC GASTRITIS NEGATIVE FOR H. PYLORI, INTESTINAL METAPLASIA, DYSPLASIA AND CARCINOMA 3. Surgical [P], distal esophagus (35cm) MILD ACUTE ESOPHAGITIS WITH SURFACE HYPERKERATOSIS FUNGAL STAIN NEGATIVE (PASF, ADEQUATE CONTROL) NEGATIVE FOR GLANDULAR EPITHELIUM, EOSINOPHILS, DYSPLASIA AND CARCINOMA   EGD 05/19/22: - Esophagogastric landmarks were identified: the Z-line was found at 38 cm, the gastroesophageal junction was found at 38 cm and the upper extent of the gastric folds was found at 38 cm from the incisors. Findings: - The exam of the esophagus was otherwise normal. No varices. - Patchy mildly erythematous mucosa  was found in the gastric antrum. Recently biopsied and no evidence of H pylori, no further biopsies obtained today. - The exam of the stomach was otherwise normal. No varices. - The examined duodenum was normal.    Colonoscopy 05/19/22: - One 5 mm polyp in the ascending colon, removed with a cold snare. Resected and retrieved. - Five 3 to 5 mm polyps in the transverse colon, removed with a cold snare. Resected and retrieved. - One 5 to 7 mm polyp in the sigmoid colon, removed with a cold snare. Resected and retrieved. -  Diverticulosis in the sigmoid colon. - The examination was otherwise normal. No cause for iron deficiency on this exam, which was most likely due to prior gastric ulcer / NSAID use  1. Surgical [P], colon, sigmoid, polyp (1) - TUBULAR ADENOMA. - NEGATIVE FOR HIGH-GRADE DYSPLASIA OR MALIGNANCY. 2. Surgical [P], colon, transverse and ascending, polyp (6) - TUBULAR ADENOMA(S) (MULTIPLE FRAGMENTS). - NEGATIVE FOR HIGH-GRADE DYSPLASIA OR MALIGNANCY.  Repeat EGD and colonoscopy 3 years   RUQ [place] 12/10/22: IMPRESSION: 1. No acute process. 2. Coarsened increased echogenicity throughout the liver. No focal lesion.   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   Hypotension    Osteopenia after menopause 03/21/2021   Stroke [facility])    Tobacco abuse 03/21/2021   Vitamin D deficiency      Past Surgical History:  Procedure Laterality Date   ABDOMINAL HYSTERECTOMY     BREAST LUMPECTOMY Right 01/29/2017   for benign disease   spleenectomy     SPLENECTOMY     Family History  Problem Relation Age of Onset   Brain cancer Brother    Colon cancer Neg Hx    Rectal cancer Neg Hx    Stomach cancer Neg Hx    Esophageal cancer Neg Hx    Social History   Tobacco Use   Smoking status: Former    Types: Cigarettes   Smokeless tobacco: Never  Vaping Use   Vaping status: Some Days  Substance Use Topics   Alcohol use: Never   Drug use: Never   Current Outpatient Medications  Medication Sig Dispense Refill   alendronate (FOSAMAX) 70 MG tablet Take 70 mg by mouth every Friday.     Biotin w/ Vitamins C & E (HAIR/SKIN/NAILS PO) Take 1 capsule by mouth daily.      Budeson-Glycopyrrol-Formoterol (BREZTRI AEROSPHERE) 160-9-4.8 MCG/ACT AERO 2 puffs     busPIRone (BUSPAR) 15 MG tablet Take 15 mg by mouth 2 (two) times daily.     Calcium Carb-Cholecalciferol (CALCIUM + D3 PO) Take 1 tablet by mouth daily.     docusate sodium (COLACE) 100 MG capsule Take 100 mg by mouth daily. 3 tabs at bedtime     Ferrous Sulfate (IRON PO) Take by mouth. 9 mg two times daily     furosemide (LASIX) 20 MG tablet Take 20 mg by mouth daily.     hydrOXYzine (ATARAX) 25 MG tablet Take 1 tablet by mouth 2 (two) times daily as needed for anxiety.     ipratropium-albuterol (DUONEB) 0.5-2.5 (3) MG/3ML SOLN Inhale 3 mLs into the lungs every 6 (six) hours as  needed for shortness of breath.     levothyroxine (SYNTHROID) 50 MCG tablet Mylan levothyroxine     linaclotide (LINZESS) 290 MCG CAPS capsule Take 1 capsule (290 mcg total) by mouth daily before breakfast. Lot: [id number], exp: 09-2023 12 capsule 0   metFORMIN (GLUCOPHAGE-XR) 500 MG 24 hr tablet Take 500 mg by mouth daily.     midodrine (PROAMATINE) 10 MG tablet Take 1 tablet (10 mg total) by mouth 3 (three) times daily with meals. 90 tablet 3   MOUNJARO 2.5 MG/0.5ML Pen Inject 2.5 mg into the skin once a week. Takes it on Sat     Multiple Vitamin (MULTIVITAMIN WITH MINERALS) TABS tablet Take 1 tablet by mouth daily.     Multiple Vitamin (MULTIVITAMIN) tablet Take 1 tablet by mouth daily.     NON FORMULARY Stool softer pt take 1 a day     Omega-3 Fatty Acids (OMEGA 3 PO) Take 1 capsule by mouth daily.     omeprazole (PRILOSEC) 40 MG capsule TAKE 1 CAPSULE BY MOUTH DAILY 90 capsule 1   ondansetron (ZOFRAN) 4 MG tablet Take 1 tablet (4 mg total) by mouth every 4 (four) hours as needed for nausea. 90 tablet 3   Oxycodone HCl 20 MG TABS 1 tablet as needed     pramipexole (MIRAPEX) 0.5 MG tablet Take 0.5 mg by mouth at bedtime.     QUEtiapine (SEROQUEL) 200 MG tablet Take 200 mg by mouth at bedtime.     VITAMIN A PO Take 1 tablet by mouth  daily.     Plecanatide (TRULANCE) 3 MG TABS Take 3 mg by mouth daily. Exp: 08-2023, LOT: [id number] (Patient not taking: Reported on 08/03/2022) 9 tablet 0   No current facility-administered medications for this visit.   No Known Allergies   Review of Systems: All systems reviewed and negative except where noted in HPI.   Lab Results  Component Value Date   WBC 11.0 (H) 12/01/2022   HGB 13.0 12/01/2022   HCT 39.5 12/01/2022   MCV 105.3 (H) 12/01/2022   PLT 197 12/01/2022    Lab Results  Component Value Date   NA 137 12/01/2022   CL 102 12/01/2022   K 3.9 12/01/2022   CO2 25 12/01/2022   BUN 19 12/01/2022   CREATININE 0.90 12/01/2022   GFRNONAA >60 12/01/2022   CALCIUM 9.7 12/01/2022   ALBUMIN 3.4 (L) 12/01/2022   GLUCOSE 113 (H) 12/01/2022    Lab Results  Component Value Date   ALT 102 (H) 12/01/2022   AST 112 (H) 12/01/2022   ALKPHOS 85 12/01/2022   BILITOT 0.4 12/01/2022   Lab Results  Component Value Date   INR 1.3 (H) 11/23/2022   INR 1.3 (H) 11/25/2021   INR 1.2 11/25/2021    MELD 3.0: 10 at 11/23/2022 12:16 PM MELD-Na: 9 at 11/23/2022 12:16 PM Calculated from: Serum Creatinine: 1.03 mg/dL at 11/25/5636 75:64 PM Serum Sodium: 139 mEq/L (Using max of 137 mEq/L) at 11/23/2022 12:16 PM Total Bilirubin: 0.5 mg/dL (Using min of 1 mg/dL) at 09/19/2949 88:41 PM Serum Albumin: 3.6 g/dL (Using max of 3.5 g/dL) at 12/23/628 16:01 PM INR(ratio): 1.3 ratio at 11/23/2022 12:16 PM Age at listing (hypothetical): 72 years Sex: Female at 11/23/2022 12:16 PM    Physical Exam: BP 124/72   Pulse (!) 50   Ht 5\' 2"  (1.575 m)   Wt 192 lb (87.1 kg)   BMI 35.12 kg/m  Constitutional: Pleasant,well-developed, female in no acute distress. Neurological: Alert and  oriented to person place and time. Psychiatric: Normal mood and affect. Behavior is normal.   ASSESSMENT: 72 y.o. female here for assessment of the following  1. Cirrhosis of liver without ascites, unspecified hepatic cirrhosis  type (HCC)   2. History of iron deficiency   3. Chronic constipation   4. Chronic narcotic use    As above, cirrhosis diagnosed within the past 2 years, likely related to fatty liver disease given her workup today and need for steroids over the years for ITP.  She is compensated, labs, ultrasound, MELD as above.  We discussed cirrhosis, risk for decompensation and HCC moving forward.  Will plan on repeating another ultrasound in November along with her basic labs.  She will let me know if any changes in her status in the interim.  I did counsel her that with her cirrhosis, she should not be using any NSAIDs on a routine basis and do recommend that she take Tylenol as needed for aches and pains.  We discussed dosing of Tylenol what is safe for her.  She has mild transaminitis from her underlying cirrhosis that I do not think is being caused by her Tylenol use.  She understands and agrees.  History of iron deficiency, ulcer has resolved, no concerning pathology on colonoscopy.  Continue to avoid NSAIDs.  Will continue to trend labs.  Main issue we discussed today is her constipation which has been quite problematic for her.  Over-the-counter laxatives and regimens do not work for her.  She responded much better to Linzess high-dose or Trulance, she has been very frustrated with her insurance and does not know what they cover for treatment of this.  I asked her to call them and clarify what prescription she is able to have her chronic constipation and I will try to get that for her.  She understands that her chronic narcotic use is making this more difficult to treat.  Will see if she gets approved for Linzess, Amitiza, Trulance etc. or if we will need to consider Movantik in light of her narcotic use etc.  I provided her a few weeks sample of Linzess 290 mg for free until we can get this ordered for her.   PLAN: - recall RUQ [place] in November - labs in November - CBC, CMET, INR, AFP - stop all NSAIDs -  can use tylenol PRN, reviewed safe dosing of this with her - She will call insurance and clarify for [place] what may be covered by her plan to treat chronic constipation.  I provided free samples of higher dose Linzess in the interim until we can sort this out for her. - f/u 6 months or sooner with issues  Harlin Rain, MD [facility]

## 2023-03-02 ENCOUNTER — Telehealth: Payer: Self-pay | Admitting: Gastroenterology

## 2023-03-02 ENCOUNTER — Other Ambulatory Visit: Payer: Self-pay

## 2023-03-02 MED ORDER — LINACLOTIDE 290 MCG PO CAPS: 290.0000 ug | ORAL_CAPSULE | Freq: Every day | ORAL | Status: DC

## 2023-03-02 NOTE — Telephone Encounter (Signed)
Called [facility].  Script sent in error.  Patient was provided samples at her office visit

## 2023-03-02 NOTE — Telephone Encounter (Signed)
[facility] from [address] called regarding a transfer of script but they are not able to because the dosage needs to reflect a full bottle of Linzess. Please call [phone]. Thanks

## 2023-04-02 ENCOUNTER — Other Ambulatory Visit: Payer: Self-pay | Admitting: Gastroenterology

## 2023-04-02 DIAGNOSIS — K2970 Gastritis, unspecified, without bleeding: Secondary | ICD-10-CM

## 2023-05-24 ENCOUNTER — Telehealth: Payer: Self-pay

## 2023-05-24 DIAGNOSIS — Z8639 Personal history of other endocrine, nutritional and metabolic disease: Secondary | ICD-10-CM

## 2023-05-24 DIAGNOSIS — K7460 Unspecified cirrhosis of liver: Secondary | ICD-10-CM

## 2023-05-24 NOTE — Telephone Encounter (Signed)
Labs and U/S ordered placed. Called patient and LM to expect call from scheduling to get scheduled for RUQ U/S and to go for labs around the same time.  Message sent to schedulers.

## 2023-05-24 NOTE — Telephone Encounter (Signed)
-----   Message from [facility] sent at 03/01/2023 11:22 AM EDT ----- Regarding: due for labs and RUQ U/S Patient due for labs : cbc, cmet, INR, and AFP  and  RUQ U/S in mid Nov.   Send to schedulers and send her MyChart so she can schedule the ultrasound when convenient for her son to take her.  Will come here for labs before hand

## 2023-06-02 ENCOUNTER — Other Ambulatory Visit: Payer: 59

## 2023-06-02 ENCOUNTER — Ambulatory Visit: Payer: 59 | Admitting: Oncology

## 2023-06-04 ENCOUNTER — Ambulatory Visit (HOSPITAL_COMMUNITY)
Admission: RE | Admit: 2023-06-04 | Discharge: 2023-06-04 | Disposition: A | Discharge status: Home / Self Care | Payer: Medicare Other | Source: Ambulatory Visit | Attending: Gastroenterology

## 2023-06-04 ENCOUNTER — Other Ambulatory Visit (INDEPENDENT_AMBULATORY_CARE_PROVIDER_SITE_OTHER): Payer: Medicare Other

## 2023-06-04 DIAGNOSIS — Z8639 Personal history of other endocrine, nutritional and metabolic disease: Secondary | ICD-10-CM

## 2023-06-04 DIAGNOSIS — K7460 Unspecified cirrhosis of liver: Secondary | ICD-10-CM | POA: Diagnosis not present

## 2023-06-04 LAB — CBC WITH DIFFERENTIAL/PLATELET
Basophils Absolute: 0.1 10*3/uL (ref 0.0–0.1)
Basophils Relative: 1 % (ref 0.0–3.0)
Eosinophils Absolute: 0.4 10*3/uL (ref 0.0–0.7)
Eosinophils Relative: 4.6 % (ref 0.0–5.0)
HCT: 40.4 % (ref 36.0–46.0)
Hemoglobin: 13.6 g/dL (ref 12.0–15.0)
Lymphocytes Relative: 24.8 % (ref 12.0–46.0)
Lymphs Abs: 2.3 10*3/uL (ref 0.7–4.0)
MCHC: 33.6 g/dL (ref 30.0–36.0)
MCV: 106.6 fL — ABNORMAL HIGH (ref 78.0–100.0)
Monocytes Absolute: 1.2 10*3/uL — ABNORMAL HIGH (ref 0.1–1.0)
Monocytes Relative: 13.1 % — ABNORMAL HIGH (ref 3.0–12.0)
Neutro Abs: 5.3 10*3/uL (ref 1.4–7.7)
Neutrophils Relative %: 56.5 % (ref 43.0–77.0)
Platelets: 207 10*3/uL (ref 150.0–400.0)
RBC: 3.79 Mil/uL — ABNORMAL LOW (ref 3.87–5.11)
RDW: 13.9 % (ref 11.5–15.5)
WBC: 9.3 10*3/uL (ref 4.0–10.5)

## 2023-06-04 LAB — COMPREHENSIVE METABOLIC PANEL
ALT: 69 U/L — ABNORMAL HIGH (ref 0–35)
AST: 59 U/L — ABNORMAL HIGH (ref 0–37)
Albumin: 3.6 g/dL (ref 3.5–5.2)
Alkaline Phosphatase: 79 U/L (ref 39–117)
BUN: 20 mg/dL (ref 6–23)
CO2: 23 meq/L (ref 19–32)
Calcium: 10.4 mg/dL (ref 8.4–10.5)
Chloride: 103 meq/L (ref 96–112)
Creatinine, Ser: 0.77 mg/dL (ref 0.40–1.20)
GFR: 76.96 mL/min (ref >60.00)
Glucose, Bld: 123 mg/dL — ABNORMAL HIGH (ref 70–99)
Potassium: 3.9 meq/L (ref 3.5–5.1)
Sodium: 136 meq/L (ref 135–145)
Total Bilirubin: 0.5 mg/dL (ref 0.2–1.2)
Total Protein: 7.9 g/dL (ref 6.0–8.3)

## 2023-06-04 LAB — PROTIME-INR
INR: 1.3 {ratio} — ABNORMAL HIGH (ref 0.8–1.0)
Prothrombin Time: 13.7 s — ABNORMAL HIGH (ref 9.6–13.1)

## 2023-06-07 LAB — AFP TUMOR MARKER: AFP-Tumor Marker: 5.8 ng/mL

## 2023-06-08 ENCOUNTER — Other Ambulatory Visit: Payer: Self-pay | Admitting: Hematology and Oncology

## 2023-06-08 DIAGNOSIS — D693 Immune thrombocytopenic purpura: Secondary | ICD-10-CM

## 2023-06-09 ENCOUNTER — Encounter: Payer: Self-pay | Admitting: Hematology and Oncology

## 2023-06-09 ENCOUNTER — Inpatient Hospital Stay (HOSPITAL_BASED_OUTPATIENT_CLINIC_OR_DEPARTMENT_OTHER): Payer: Medicare Other | Admitting: Hematology and Oncology

## 2023-06-09 ENCOUNTER — Inpatient Hospital Stay: Payer: Medicare Other | Attending: Hematology and Oncology

## 2023-06-09 DIAGNOSIS — Z720 Tobacco use: Secondary | ICD-10-CM

## 2023-06-09 DIAGNOSIS — K7460 Unspecified cirrhosis of liver: Secondary | ICD-10-CM

## 2023-06-09 DIAGNOSIS — E039 Hypothyroidism, unspecified: Secondary | ICD-10-CM | POA: Diagnosis not present

## 2023-06-09 DIAGNOSIS — R188 Other ascites: Secondary | ICD-10-CM | POA: Insufficient documentation

## 2023-06-09 DIAGNOSIS — D693 Immune thrombocytopenic purpura: Secondary | ICD-10-CM | POA: Insufficient documentation

## 2023-06-09 DIAGNOSIS — D72829 Elevated white blood cell count, unspecified: Secondary | ICD-10-CM | POA: Diagnosis not present

## 2023-06-09 DIAGNOSIS — Z79899 Other long term (current) drug therapy: Secondary | ICD-10-CM | POA: Insufficient documentation

## 2023-06-09 DIAGNOSIS — Z862 Personal history of diseases of the blood and blood-forming organs and certain disorders involving the immune mechanism: Secondary | ICD-10-CM

## 2023-06-09 DIAGNOSIS — F17210 Nicotine dependence, cigarettes, uncomplicated: Secondary | ICD-10-CM | POA: Diagnosis not present

## 2023-06-09 LAB — CMP (CANCER CENTER ONLY)
ALT: 119 U/L — ABNORMAL HIGH (ref 0–44)
AST: 102 U/L — ABNORMAL HIGH (ref 15–41)
Albumin: 4 g/dL (ref 3.5–5.0)
Alkaline Phosphatase: 80 U/L (ref 38–126)
Anion gap: 12 (ref 5–15)
BUN: 22 mg/dL (ref 8–23)
CO2: 25 mmol/L (ref 22–32)
Calcium: 11.5 mg/dL — ABNORMAL HIGH (ref 8.9–10.3)
Chloride: 101 mmol/L (ref 98–111)
Creatinine: 0.92 mg/dL (ref 0.44–1.00)
GFR, Estimated: >60 mL/min (ref >60)
Glucose, Bld: 128 mg/dL — ABNORMAL HIGH (ref 70–99)
Potassium: 4.3 mmol/L (ref 3.5–5.1)
Sodium: 138 mmol/L (ref 135–145)
Total Bilirubin: 0.6 mg/dL (ref <1.2)
Total Protein: 8.4 g/dL — ABNORMAL HIGH (ref 6.5–8.1)

## 2023-06-09 LAB — CBC WITH DIFFERENTIAL (CANCER CENTER ONLY)
Abs Immature Granulocytes: 0.05 10*3/uL (ref 0.00–0.07)
Basophils Absolute: 0.1 10*3/uL (ref 0.0–0.1)
Basophils Relative: 1 %
Eosinophils Absolute: 0.1 10*3/uL (ref 0.0–0.5)
Eosinophils Relative: 1 %
HCT: 38.7 % (ref 36.0–46.0)
Hemoglobin: 13.4 g/dL (ref 12.0–15.0)
Immature Granulocytes: 1 %
Lymphocytes Relative: 33 %
Lymphs Abs: 3.5 10*3/uL (ref 0.7–4.0)
MCH: 35.3 pg — ABNORMAL HIGH (ref 26.0–34.0)
MCHC: 34.6 g/dL (ref 30.0–36.0)
MCV: 101.8 fL — ABNORMAL HIGH (ref 80.0–100.0)
Monocytes Absolute: 1 10*3/uL (ref 0.1–1.0)
Monocytes Relative: 9 %
Neutro Abs: 6 10*3/uL (ref 1.7–7.7)
Neutrophils Relative %: 55 %
Platelet Count: 223 10*3/uL (ref 150–400)
RBC: 3.8 MIL/uL — ABNORMAL LOW (ref 3.87–5.11)
RDW: 13.4 % (ref 11.5–15.5)
WBC Count: 10.6 10*3/uL — ABNORMAL HIGH (ref 4.0–10.5)
nRBC: 0 % (ref 0.0–0.2)
nRBC: 0 /100{WBCs}

## 2023-06-09 LAB — PHOSPHORUS: Phosphorus: 3.4 mg/dL (ref 2.5–4.6)

## 2023-06-09 LAB — VITAMIN D 25 HYDROXY (VIT D DEFICIENCY, FRACTURES): Vit D, 25-Hydroxy: 36.83 ng/mL (ref 30–100)

## 2023-06-09 NOTE — Progress Notes (Signed)
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  She has been in remission since November 2022 after receiving rituximab for 4 cycles.  We will plan continued follow-up.  Hypercalcemia New mild hypercalcemia.  Her only symptom is fatigue.  Possible causes include hyperparathyroidism, hyperthyroidism, vitamin D intoxication, medications i.e. vitamin A, even multiple myeloma given elevated serum protein.  She has hypothyroidism and is on levothyroxine 50 mcg daily.  I will evaluate for other causes.  I will have her hold calcium/vitamin D, vitamin A and furosemide.  I have her return in 1 to 2 weeks to see Dr. Angelene Giovanni to discuss the results.  Tobacco abuse She started smoking at age 41 and so she has been smoking approximately 1 pack per day for 50+ years and continues to smoke despite numerous discussions about smoking cessation.  She finally quit smoking in February 2023, but she continues to vape daily.  She has never had low-dose CT chest for lung cancer screening.  Will need to discuss at future appointment.  Leukocytosis Chronic mild leukocytosis, which is stable.  Cirrhosis of liver with ascites (HCC) Cirrhosis.  Recent liver ultrasound was stable.    The patient understands the plans discussed today and is in agreement with them.  She knows to contact our office if she develops concerns prior to her next appointment.    60 minutes was spent in patient care.  This included time spent preparing to see the patient (e.g., review of tests), obtaining and/or reviewing separately obtained history, counseling and educating the patient/family/caregiver, ordering medications, tests, or procedures; documenting clinical information in the electronic or other health record,  independently interpreting results and communicating results to the patient/family/caregiver as well as coordination of care.    Adah Perl, PA-C  [facility] [address] [place] [address] Dept: [phone] Dept Fax: [phone]   Orders Placed This Encounter  Procedures   Multiple Myeloma Panel (SPEP&IFE w/QIG)    Standing Status:   Future    Number of Occurrences:   1    Standing Expiration Date:   06/08/2024   Kappa/lambda light chains    Standing Status:   Future    Number of Occurrences:   1    Standing Expiration Date:   06/08/2024   Calcium, ionized    Standing Status:   Future    Number of Occurrences:   1    Standing Expiration Date:   06/08/2024   Parathyroid hormone, intact (no Ca)    Standing Status:   Future    Number of Occurrences:   1    Standing Expiration Date:   06/08/2024   PTH-related peptide    Standing Status:   Future    Number of Occurrences:   1    Standing Expiration Date:   06/08/2024   Vitamin A    Standing Status:   Future    Number of Occurrences:   1    Standing Expiration Date:   06/08/2024   VITAMIN D 25 Hydroxy (Vit-D Deficiency, Fractures)    Standing Status:   Future    Number of Occurrences:   1    Standing Expiration Date:  06/08/2024   Phosphorus    Standing Status:   Future    Number of Occurrences:   1    Standing Expiration Date:   06/08/2024   TSH    Standing Status:   Future    Number of Occurrences:   1    Standing Expiration Date:   06/09/2024      CHIEF COMPLAINT:  CC: History of ITP with multiple recurrences  Current Treatment: Surveillance  HISTORY OF PRESENT ILLNESS:  Jean Kramer is a 72 year old with a history of ITP originally diagnosed in the 64's.  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 She initially responded to that, but over the years has been on and off corticosteroids for relapsing ITP, and was also treated with danazol for a time.  Her ITP had been in remission for many years. She has also has had chronic leukocytosis.    Annual mammogram in May 2018 revealed an intraductal mass in the right breast at 10 o 'clock measuring 6 mm.  We recommended an ultrasound-guided biopsy which revealed fibroadenomatoid and fibrocystic changes, including cystic and micro papillary apocrine metaplasia, apocrine adenosis, stromal fibrosis, and foci of pseudo angiomatous stromal hyperplasia and focal sclerosis.  It was recommended that she have excision of this area and that was performed in July by Dr. Lars Mage.  The final pathology revealed atrophic breast tissue with fibrocystic changes and foci of usual ductal hyperplasia but no intraductal papilloma, fibroadenoma, atypia or malignancy.  There was another lumpectomy sample, which revealed micropapillary ductal hyperplasia, focal ectatic ducts, and patchy periductal chronic inflammation.  The other area of lumpectomy had morphologic changes similar to the original biopsy.  We had been seeing her regularly, but then she was lost to follow-up after May 2019. She had a diagnostic right mammogram in April 2020, which revealed evolving fat necrosis.  Annual bilateral screening mammograms in October 2022 and November 2023 did not reveal any evidence of malignancy.   She was admitted to [facility] in July 2022 due to developing an upper GI bleed from taking BC powders.  We had advised her to discontinue these previously.  She switched to Tylenol for her pain.  While in the hospital, her platelet count dropped into the teens, and she was given steroids and IVIG.   She had extensive bruising. She was in the hospital for a total of 15 days and her platelet count had gone up to 63,000 at the time of discharge.  She  was referred back to [place].  She was given a prescription for prednisone at the time of discharge, but Dr. Gilman Buttner advised her not to take this, as her platelet count was normal. When we saw her in early August her platelet count was up to 168,000.  She had leukocytosis with white count was 14,000.  Her bruises had resolved and she was doing well.   She was then admitted to [facility] in August 2022 due to severe thrombocytopenia with a platelet count less than 5,000.  She presented to the hospital after she removed a scab from her knee, and the wound continued to ooze blood.  She received Nplate injection x2, as well as 2 units of PRBCs and 1 unit of platelets. Upon discharge, her platelet count had improved to 17,000.  She was advised to  discontinue aspirin 81 mg.  She once again had extensive bruising, but also noted shortness of breath with exertion, insomnia, and occasional dizziness and lightheadedness.  We saw her for hospital follow-up and she had resolution of the leukocytosis and her platelet count was up  to 41,000.  Chemistries revealed worsening elevation of the liver transaminases with an SGOT of 117 and an SGPT of 90.  She states that she has known fatty liver.  As her platelets remained low, she was started on prednisone 60 mg daily. She initially had an excellent response to high-dose prednisone, but when we tapered her rapidly, she had recurrent severe thrombocytopenia.  We were unable to slowly taper the prednisone, so she went on to receive rituximab weekly for 4 weeks with an excellent response.  She was rapidly tapered off of prednisone while receiving rituximab.  Her platelets have remained normal since November 2022.     She was diagnosed with liver cirrhosis in 2023 and sees Dr. Adela Lank in [facility].  EGD in July 2023 revealed LA Grade A esophagitis was found focally at the GEJ. A plaque was found in the lower third of the esophagus, 35 cm from the incisors, could not be lavaged off.  Biopsies were taken with a cold forceps for histology. The exam of the esophagus was otherwise normal. No varices. Diffuse mild inflammation characterized by erythema, friability and granularity was found in the entire examined stomach. One non-bleeding gastric ulcer with no stigmata of bleeding was found in the prepyloric region of the stomach, with some associated inflammatory nodularity. The lesion was 3 mm in largest dimension. Biopsies were taken from the periphery of the lesion with a cold forceps for histology. The exam of the stomach was otherwise normal. No varices. Biopsies were taken with a cold forceps in the gastric body, at the incisura and in the gastric antrum for Helicobacter pylori testing. The examined duodenum was normal.  She had colonoscopy and repeat EGD in October 2023 with removal of several small colon polyps.  Pathology revealed tubular adenomas.  EGD revealed good healing of the gastric ulcer with no new findings.  Follow-up colonoscopy and EGD in 3 years was recommended.   INTERVAL HISTORY:  Daicy is here today for repeat clinical assessment and states she has been more fatigued recently.  She denies abnormal bleeding or bruising.  She has chronic mild nausea relieved with medication.  She reports intermittent abdominal cramping, but denies constipation.  She has chronic shortness of breath which is stable.  She reports occasional night sweats.  She denies fevers or chills. She denies pain. Her appetite is good. Her weight has increased 4 pounds over last 6 months .  She states she had a recent liver ultrasound which apparently looked good.  She is on furosemide for leg swelling.  She is on midodrine for hypotension.  She states she did not take her 1 PM dose of midodrine.  She states she she is scheduled for mammogram on December 2.  REVIEW OF SYSTEMS:  Review of Systems  Constitutional:  Positive for diaphoresis (occasional night sweats) and fatigue. Negative for appetite  change, chills, fever and unexpected weight change.  HENT:   Negative for lump/mass, mouth sores, nosebleeds and sore throat.   Eyes:  Positive for eye problems (occasional blurry, floaters). Negative for icterus.  Respiratory:  Positive for shortness of breath (chronic, mild with exertion). Negative for cough.   Cardiovascular:  Positive for leg swelling. Negative for chest pain and palpitations.  Gastrointestinal:  Positive for abdominal pain (intermittent abdominal cramping). Negative for blood in stool, constipation, diarrhea, nausea and vomiting.  Endocrine: Negative for hot flashes.  Genitourinary:  Negative for difficulty urinating, dysuria, frequency, hematuria, vaginal bleeding and vaginal discharge.   Musculoskeletal:  Negative for arthralgias (hands, cramping), back pain and myalgias.  Skin:  Negative for itching and rash.  Neurological:  Positive for numbness (Neuropathy bilateral feet). Negative for dizziness and headaches.  Hematological:  Negative for adenopathy. Does not bruise/bleed easily.  Psychiatric/Behavioral:  Negative for depression and sleep disturbance. The patient is not nervous/anxious.      VITALS:  Blood pressure (!) 78/47, pulse 89, temperature 98.4 F (36.9 C), temperature source Oral, resp. rate 18, height 5\' 2"  (1.575 m), weight 196 lb 1.6 oz (89 kg), SpO2 95%.  Wt Readings from Last 3 Encounters:  06/09/23 196 lb 1.6 oz (89 kg)  03/01/23 192 lb (87.1 kg)  12/01/22 193 lb 1.6 oz (87.6 kg)    Body mass index is 35.87 kg/m.  Performance status (ECOG): 2 - Symptomatic, <50% confined to bed  PHYSICAL EXAM:  Physical Exam Vitals and nursing note reviewed.  Constitutional:      General: She is not in acute distress.    Appearance: Normal appearance.  HENT:     Head: Normocephalic and atraumatic.     Mouth/Throat:     Mouth: Mucous membranes are moist.     Pharynx: Oropharynx is clear. No oropharyngeal exudate or posterior oropharyngeal erythema.   Eyes:     General: No scleral icterus.    Extraocular Movements: Extraocular movements intact.     Conjunctiva/sclera: Conjunctivae normal.     Pupils: Pupils are equal, round, and reactive to light.  Cardiovascular:     Rate and Rhythm: Normal rate and regular rhythm.     Heart sounds: Normal heart sounds. No murmur heard.    No friction rub. No gallop.  Pulmonary:     Effort: Pulmonary effort is normal.     Breath sounds: Normal breath sounds. No wheezing, rhonchi or rales.  Abdominal:     General: There is no distension.     Palpations: Abdomen is soft. There is no mass.     Tenderness: There is no abdominal tenderness.  Musculoskeletal:        General: Normal range of motion.     Cervical back: Normal range of motion and neck supple. No tenderness.     Right lower leg: No edema.     Left lower leg: No edema.  Lymphadenopathy:     Cervical: No cervical adenopathy.  Skin:    General: Skin is warm and dry.     Coloration: Skin is not jaundiced.     Findings: No rash.  Neurological:     Mental Status: She is alert and oriented to person, place, and time.     Cranial Nerves: No cranial nerve deficit.  Psychiatric:        Mood and Affect: Mood normal.        Behavior: Behavior normal.        Thought Content: Thought content normal.     LABS:      Latest Ref Rng & Units 06/09/2023    1:48 PM 06/04/2023    7:59 AM 12/01/2022    1:28 PM  CBC  WBC 4.0 - 10.5 K/uL 10.6  9.3  11.0   Hemoglobin 12.0 - 15.0 g/dL 60.4  54.0  98.1   Hematocrit 36.0 - 46.0 %  38.7  40.4  39.5   Platelets 150 - 400 K/uL 223  207.0  197       Latest Ref Rng & Units 06/09/2023    1:48 PM 06/04/2023    7:59 AM 12/01/2022    1:28 PM  CMP  Glucose 70 - 99 mg/dL 098  119  147   BUN 8 - 23 mg/dL 22  20  19    Creatinine 0.44 - 1.00 mg/dL 8.29  5.62  1.30   Sodium 135 - 145 mmol/L 138  136  137   Potassium 3.5 - 5.1 mmol/L 4.3  3.9  3.9   Chloride 98 - 111 mmol/L 101  103  102   CO2 22 - 32  mmol/L 25  23  25    Calcium 8.9 - 10.3 mg/dL 86.5  78.4  9.7   Total Protein 6.5 - 8.1 g/dL 8.4  7.9  8.8   Total Bilirubin <1.2 mg/dL 0.6  0.5  0.4   Alkaline Phos 38 - 126 U/L 80  79  85   AST 15 - 41 U/L 102  59  112   ALT 0 - 44 U/L 119  69  102      No results found for: "TOTALPROTELP", "ALBUMINELP", "A1GS", "A2GS", "BETS", "BETA2SER", "GAMS", "MSPIKE", "SPEI"  Lab Results  Component Value Date   TIBC 345.8 05/08/2022   TIBC 330 02/08/2021   FERRITIN 271.8 05/08/2022   FERRITIN 137 02/08/2021   IRONPCTSAT 26.9 05/08/2022   IRONPCTSAT 11 02/08/2021     STUDIES:  US Abdomen Limited RUQ (LIVER/GB)  Result Date: 06/04/2023 CLINICAL DATA:  Cirrhosis EXAM: ULTRASOUND ABDOMEN LIMITED RIGHT UPPER QUADRANT COMPARISON:  Ultrasound abdomen 12/10/2022 FINDINGS: Gallbladder: Surgically absent Common bile duct: Diameter: 4 mm Liver: Coarsened echogenicity and nodular contour. No focal lesion. Portal vein is patent on color Doppler imaging with normal direction of blood flow towards the liver. Other: None. IMPRESSION: Cirrhotic morphology of the liver. No focal lesion. Electronically Signed   By: Annia Belt M.D.   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 Her smoking use included cigarettes. She has never used smokeless tobacco. She reports that she does not drink alcohol and does not use drugs.The patient is alone today.  Allergies: No Known Allergies  Current Medications: Current Outpatient Medications  Medication Sig Dispense Refill   alendronate (FOSAMAX) 70  MG tablet Take 70 mg by mouth every Friday.     Biotin w/ Vitamins C & E (HAIR/SKIN/NAILS PO) Take 1 capsule by mouth daily.     Budeson-Glycopyrrol-Formoterol (BREZTRI AEROSPHERE) 160-9-4.8 MCG/ACT AERO 2 puffs     busPIRone (BUSPAR) 15 MG tablet Take 15 mg by mouth 2 (two) times daily.     Calcium Carb-Cholecalciferol (CALCIUM + D3 PO) Take 1 tablet by mouth daily.     docusate sodium (COLACE) 100 MG capsule Take 100 mg by mouth daily. 3 tabs at bedtime     Ferrous Sulfate (IRON PO) Take by mouth. 9 mg two times daily     furosemide (LASIX) 20 MG tablet Take 20 mg by mouth daily.     hydrOXYzine (ATARAX) 25 MG tablet Take 1 tablet by mouth 2 (two) times daily as needed for anxiety.     ipratropium-albuterol (DUONEB) 0.5-2.5 (3) MG/3ML SOLN Inhale 3 mLs into the lungs every 6 (six) hours as needed for shortness of breath. (Patient not taking: Reported on 06/09/2023)     levothyroxine (SYNTHROID) 50 MCG tablet Mylan levothyroxine     linaclotide (LINZESS) 290 MCG CAPS capsule Take 1 capsule (290 mcg total) by mouth daily before breakfast. Lot: [id number], exp: 09-2023     metFORMIN (GLUCOPHAGE-XR) 500 MG 24 hr tablet Take 500 mg by mouth daily.     midodrine (PROAMATINE) 10 MG tablet Take 1 tablet (10 mg total) by mouth 3 (three) times daily with meals. 90 tablet 3   MOUNJARO 2.5 MG/0.5ML Pen Inject 2.5 mg into the skin once a week. Takes it on Sat (Patient not taking: Reported on 06/09/2023)     Multiple Vitamin  (MULTIVITAMIN WITH MINERALS) TABS tablet Take 1 tablet by mouth daily.     NON FORMULARY Stool softer pt take 1 a day     Omega-3 Fatty Acids (OMEGA 3 PO) Take 1 capsule by mouth daily.     omeprazole (PRILOSEC) 40 MG capsule TAKE 1 CAPSULE BY MOUTH DAILY 90 capsule 3   ondansetron (ZOFRAN) 4 MG tablet Take 1 tablet (4 mg total) by mouth every 4 (four) hours as needed for nausea. 90 tablet 3   Oxycodone HCl 20 MG TABS 1 tablet as needed     Plecanatide (TRULANCE) 3 MG TABS Take 3 mg by mouth daily. Exp: 08-2023, LOT: [id number] (Patient not taking: Reported on 08/03/2022) 9 tablet 0   pramipexole (MIRAPEX) 0.5 MG tablet Take 0.5 mg by mouth at bedtime.     QUEtiapine (SEROQUEL) 200 MG tablet Take 200 mg by mouth at bedtime.     VITAMIN A PO Take 1 tablet by mouth daily.     No current facility-administered medications for this visit.

## 2023-06-10 ENCOUNTER — Encounter: Payer: Self-pay | Admitting: Hematology and Oncology

## 2023-06-10 DIAGNOSIS — Z862 Personal history of diseases of the blood and blood-forming organs and certain disorders involving the immune mechanism: Secondary | ICD-10-CM | POA: Insufficient documentation

## 2023-06-10 HISTORY — DX: Hypercalcemia: E83.52

## 2023-06-10 LAB — PARATHYROID HORMONE, INTACT (NO CA): PTH: 24 pg/mL (ref 15–65)

## 2023-06-10 LAB — KAPPA/LAMBDA LIGHT CHAINS
Kappa free light chain: 58.6 mg/L — ABNORMAL HIGH (ref 3.3–19.4)
Kappa, lambda light chain ratio: 0.51 (ref 0.26–1.65)
Lambda free light chains: 116 mg/L — ABNORMAL HIGH (ref 5.7–26.3)

## 2023-06-10 LAB — TSH: TSH: 2.481 u[IU]/mL (ref 0.350–4.500)

## 2023-06-10 LAB — CALCIUM, IONIZED: Calcium, Ionized, Serum: 5.7 mg/dL — ABNORMAL HIGH (ref 4.5–5.6)

## 2023-06-10 NOTE — Assessment & Plan Note (Signed)
Chronic mild leukocytosis, which is stable.

## 2023-06-10 NOTE — Assessment & Plan Note (Signed)
The patient has a history of ITP with multiple relapses.  She has been in remission since November 2022 after receiving rituximab for 4 cycles.  We will plan continued follow-up.

## 2023-06-10 NOTE — Assessment & Plan Note (Signed)
She started smoking at age 72 and so she has been smoking approximately 1 pack per day for 50+ years and continues to smoke despite numerous discussions about smoking cessation.  She finally quit smoking in February 2023, but she continues to vape daily.  She has never had low-dose CT chest for lung cancer screening.  Will need to discuss at future appointment.

## 2023-06-10 NOTE — Assessment & Plan Note (Addendum)
New mild hypercalcemia.  Her only symptom is fatigue.  Possible causes include hyperparathyroidism, hyperthyroidism, vitamin D intoxication, medications i.e. vitamin A, even multiple myeloma given elevated serum protein.  She has hypothyroidism and is on levothyroxine 50 mcg daily.  I will evaluate for other causes.  I will have her hold calcium/vitamin D, vitamin A and furosemide.  I have her return in 1 to 2 weeks to see Dr. Angelene Giovanni to discuss the results.

## 2023-06-12 LAB — VITAMIN A: Vitamin A (Retinoic Acid): 37.8 ug/dL (ref 22.0–69.5)

## 2023-06-13 LAB — MULTIPLE MYELOMA PANEL, SERUM
Albumin SerPl Elph-Mcnc: 3.4 g/dL (ref 2.9–4.4)
Albumin/Glob SerPl: 0.8 (ref 0.7–1.7)
Alpha 1: 0.3 g/dL (ref 0.0–0.4)
Alpha2 Glob SerPl Elph-Mcnc: 0.8 g/dL (ref 0.4–1.0)
B-Globulin SerPl Elph-Mcnc: 2.1 g/dL — ABNORMAL HIGH (ref 0.7–1.3)
Gamma Glob SerPl Elph-Mcnc: 1.2 g/dL (ref 0.4–1.8)
Globulin, Total: 4.3 g/dL — ABNORMAL HIGH (ref 2.2–3.9)
IgA: 1172 mg/dL — ABNORMAL HIGH (ref 64–422)
IgG (Immunoglobin G), Serum: 1682 mg/dL — ABNORMAL HIGH (ref 586–1602)
IgM (Immunoglobulin M), Srm: 22 mg/dL — ABNORMAL LOW (ref 26–217)
M Protein SerPl Elph-Mcnc: 0.9 g/dL — ABNORMAL HIGH
Total Protein ELP: 7.7 g/dL (ref 6.0–8.5)

## 2023-06-13 NOTE — Assessment & Plan Note (Signed)
Cirrhosis.  Recent liver ultrasound was stable.

## 2023-06-21 ENCOUNTER — Inpatient Hospital Stay: Payer: Medicare Other

## 2023-06-21 ENCOUNTER — Inpatient Hospital Stay: Payer: Medicare Other | Attending: Hematology and Oncology | Admitting: Oncology

## 2023-06-21 DIAGNOSIS — Z79899 Other long term (current) drug therapy: Secondary | ICD-10-CM | POA: Diagnosis not present

## 2023-06-21 DIAGNOSIS — K7460 Unspecified cirrhosis of liver: Secondary | ICD-10-CM | POA: Insufficient documentation

## 2023-06-21 DIAGNOSIS — D693 Immune thrombocytopenic purpura: Secondary | ICD-10-CM

## 2023-06-21 DIAGNOSIS — D472 Monoclonal gammopathy: Secondary | ICD-10-CM | POA: Diagnosis not present

## 2023-06-21 DIAGNOSIS — R188 Other ascites: Secondary | ICD-10-CM

## 2023-06-21 DIAGNOSIS — K7581 Nonalcoholic steatohepatitis (NASH): Secondary | ICD-10-CM | POA: Diagnosis not present

## 2023-06-21 DIAGNOSIS — R600 Localized edema: Secondary | ICD-10-CM

## 2023-06-21 LAB — CMP (CANCER CENTER ONLY)
ALT: 105 U/L — ABNORMAL HIGH (ref 0–44)
AST: 87 U/L — ABNORMAL HIGH (ref 15–41)
Albumin: 3.9 g/dL (ref 3.5–5.0)
Alkaline Phosphatase: 79 U/L (ref 38–126)
Anion gap: 9 (ref 5–15)
BUN: 22 mg/dL (ref 8–23)
CO2: 24 mmol/L (ref 22–32)
Calcium: 10.4 mg/dL — ABNORMAL HIGH (ref 8.9–10.3)
Chloride: 101 mmol/L (ref 98–111)
Creatinine: 0.84 mg/dL (ref 0.44–1.00)
GFR, Estimated: >60 mL/min (ref >60)
Glucose, Bld: 77 mg/dL (ref 70–99)
Potassium: 4 mmol/L (ref 3.5–5.1)
Sodium: 134 mmol/L — ABNORMAL LOW (ref 135–145)
Total Bilirubin: 0.5 mg/dL (ref <1.2)
Total Protein: 8.5 g/dL — ABNORMAL HIGH (ref 6.5–8.1)

## 2023-06-21 LAB — LACTATE DEHYDROGENASE: LDH: 195 U/L — ABNORMAL HIGH (ref 98–192)

## 2023-06-21 LAB — CBC WITH DIFFERENTIAL (CANCER CENTER ONLY)
Abs Immature Granulocytes: 0 10*3/uL (ref 0.00–0.07)
Basophils Absolute: 0.1 10*3/uL (ref 0.0–0.1)
Basophils Relative: 1 %
Eosinophils Absolute: 0.1 10*3/uL (ref 0.0–0.5)
Eosinophils Relative: 1 %
HCT: 38.6 % (ref 36.0–46.0)
Hemoglobin: 13.3 g/dL (ref 12.0–15.0)
Immature Granulocytes: 0 %
Lymphocytes Relative: 36 %
Lymphs Abs: 3.3 10*3/uL (ref 0.7–4.0)
MCH: 35.3 pg — ABNORMAL HIGH (ref 26.0–34.0)
MCHC: 34.5 g/dL (ref 30.0–36.0)
MCV: 102.4 fL — ABNORMAL HIGH (ref 80.0–100.0)
Monocytes Absolute: 0.9 10*3/uL (ref 0.1–1.0)
Monocytes Relative: 10 %
Neutro Abs: 4.8 10*3/uL (ref 1.7–7.7)
Neutrophils Relative %: 52 %
Platelet Count: 216 10*3/uL (ref 150–400)
RBC: 3.77 MIL/uL — ABNORMAL LOW (ref 3.87–5.11)
RDW: 13.3 % (ref 11.5–15.5)
Smear Review: NORMAL
WBC Count: 9.2 10*3/uL (ref 4.0–10.5)
nRBC: 0 % (ref 0.0–0.2)
nRBC: 0 /100{WBCs}

## 2023-06-21 LAB — T4, FREE: Free T4: 1.17 ng/dL — ABNORMAL HIGH (ref 0.61–1.12)

## 2023-06-21 LAB — PTH-RELATED PEPTIDE: PTH-related peptide: <2 pmol/L

## 2023-06-21 LAB — HM MAMMOGRAPHY

## 2023-06-21 LAB — TSH: TSH: 1.477 u[IU]/mL (ref 0.350–4.500)

## 2023-06-21 NOTE — Patient Instructions (Signed)
Please continue to hold your calcium, vitamin D and Vitamin A. You should continue to take Lasix.

## 2023-06-21 NOTE — Progress Notes (Unsigned)
[facility] Cancer Follow up Visit:  Patient Care Team: Galvin Proffer, MD as PCP - General (Internal Medicine) Dellia Beckwith, MD as Consulting Physician (Oncology)  CHIEF COMPLAINTS/PURPOSE OF CONSULTATION:   HISTORY OF PRESENTING ILLNESS: Jean Kramer 72 y.o. female is here for evaluation of hypercalcemia.  The patient has chronic  ITP with multiple relapses.  Treatment has included weekly rituximab in November 2022.  She was then completely weaned off of prednisone, and has remained in remission since that time.   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  Medical problems include chronic leukocytosis, tobacco abuse (now vaping), breast lumpectomy in July 2018 which was benign, osteopenia, NASH cirrhosis, PUD, COPD  June 04 2023:  U/S liver.  Cirrhotic morphology of the liver. No focal lesion.   June 09, 2023: Hematology follow-up visit WBC 10.6 hemoglobin 13.4 MCV 102 platelet count 223; 55 segs 33 lymphs 9 monos 1 EO 1 basophil SPEP with IEP demonstrated 0.9 g/dL of IgA lambda monoclonal protein.  Serum kappa 58.6 lambda 116 ratio 0.51 IgG 1682 IgA 1172 IgM 22 CMP notable for AST 102 ALT 119 T. bili 0.6.  Calcium 11.5 albumin 4.0.  Ionized calcium 5.7 (ULN 5.6) phosphorus 3.4 25-hydroxy vitamin D 36.8.  Vitamin A 37.8  PTH RP undetectable.  PTH 24  June 21 2023:  Scheduled follow up for hypercalcemia, ITP and monoclonal gammopathy.  Reviewed results of labs with patient.  Since last visit patient has been holding the calcium, vitamin A, vitamin D and lasix.  She is also holding oral iron.    Instructed patient to continue taking lasix because she has swelling of her feet and ankles.  No bleeding issues.    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   Hypercalcemia 06/10/2023   Hypotension    Osteopenia after menopause 03/21/2021   Stroke (HCC)    Tobacco abuse 03/21/2021   Vitamin D deficiency     SURGICAL HISTORY: Past Surgical History:  Procedure Laterality Date   ABDOMINAL HYSTERECTOMY     BREAST LUMPECTOMY Right 01/29/2017   for benign disease   spleenectomy     SPLENECTOMY      SOCIAL HISTORY: Social History   Socioeconomic History   Marital status: Widowed    Spouse name: 1   Number of children: Not on file   Years of education: Not on file   Highest education level: Not on file  Occupational History   Not on file  Tobacco Use   Smoking status: Former    Types: Cigarettes   Smokeless tobacco: Never  Vaping Use   Vaping status: Some Days  Substance and Sexual Activity   Alcohol use: Never   Drug use: Never   Sexual activity: Not on file  Other Topics Concern   Not on file  Social History Narrative   Not on file   Social Determinants of Health   Financial Resource Strain: Not on file  Food Insecurity: Not on file  Transportation Needs: Not on file  Physical Activity: Not on file  Stress: Not on file  Social Connections: Not on file  Intimate Partner Violence: Not on file    FAMILY HISTORY Family History  Problem Relation Age of Onset   Brain cancer Brother    Colon cancer Neg Hx    Rectal cancer Neg Hx    Stomach cancer Neg Hx    Esophageal cancer Neg Hx     ALLERGIES:  has No Known Allergies.  MEDICATIONS:  Current Outpatient Medications  Medication Sig Dispense Refill   alendronate (FOSAMAX) 70 MG tablet Take 70 mg by mouth every Friday.     Biotin w/ Vitamins C & E (HAIR/SKIN/NAILS PO) Take 1 capsule by mouth daily.     Budeson-Glycopyrrol-Formoterol (BREZTRI AEROSPHERE) 160-9-4.8  MCG/ACT AERO 2 puffs     busPIRone (BUSPAR) 15 MG tablet Take 15 mg by mouth 2 (two) times daily.     Calcium Carb-Cholecalciferol (CALCIUM + D3 PO) Take 1 tablet by mouth daily.     docusate sodium (COLACE) 100 MG capsule Take 100 mg by mouth daily. 3 tabs at bedtime     Ferrous Sulfate (IRON PO) Take by mouth. 9 mg two times daily     furosemide (LASIX) 20 MG tablet Take 20 mg by mouth daily.     hydrOXYzine (ATARAX) 25 MG tablet Take 1 tablet by mouth 2 (two) times daily as needed for anxiety.     ipratropium-albuterol (DUONEB) 0.5-2.5 (3) MG/3ML SOLN Inhale 3 mLs into the lungs every 6 (six) hours as needed for shortness of breath. (Patient not taking: Reported on 06/09/2023)     levothyroxine (SYNTHROID) 50 MCG tablet Mylan levothyroxine     linaclotide (LINZESS) 290 MCG CAPS capsule Take 1 capsule (290 mcg total) by mouth daily before breakfast. Lot: [id number], exp: 09-2023     metFORMIN (GLUCOPHAGE-XR) 500 MG 24 hr tablet Take 500 mg by mouth daily.     midodrine (PROAMATINE) 10 MG tablet Take 1 tablet (10 mg total) by mouth 3 (three) times daily with meals. 90 tablet 3   MOUNJARO 2.5 MG/0.5ML Pen Inject 2.5 mg into the skin once a week. Takes it on Sat (Patient not taking: Reported on 06/09/2023)     Multiple Vitamin (MULTIVITAMIN WITH MINERALS) TABS tablet Take 1 tablet by mouth daily.     NON FORMULARY Stool softer pt take 1 a day     Omega-3 Fatty Acids (OMEGA 3 PO) Take 1 capsule by mouth daily.     omeprazole (PRILOSEC) 40 MG capsule TAKE 1 CAPSULE BY MOUTH DAILY 90 capsule 3   ondansetron (ZOFRAN) 4 MG tablet Take 1 tablet (4 mg total) by mouth every 4 (four) hours as needed for nausea. 90 tablet 3   Oxycodone HCl 20 MG TABS 1 tablet as needed     Plecanatide (TRULANCE) 3 MG TABS Take 3 mg by mouth daily. Exp: 08-2023, LOT: [id number] (Patient not taking: Reported on 08/03/2022) 9 tablet 0   pramipexole (MIRAPEX) 0.5 MG tablet Take 0.5 mg by mouth at bedtime.     QUEtiapine (SEROQUEL) 200  MG tablet Take 200 mg by mouth at bedtime.     VITAMIN A PO Take 1 tablet by mouth daily.     No current facility-administered medications for this visit.    PHYSICAL EXAMINATION:  ECOG PERFORMANCE STATUS: {CHL ONC ECOG PS:[phone]}   There were no vitals filed for this visit.  There were no vitals filed for this visit.   Physical Exam   LABORATORY DATA: I have personally reviewed  the data as listed:  Appointment on 06/09/2023  Component Date Value Ref Range Status   Sodium 06/09/2023 138  135 - 145 mmol/L Final   Potassium 06/09/2023 4.3  3.5 - 5.1 mmol/L Final   Chloride 06/09/2023 101  98 - 111 mmol/L Final   CO2 06/09/2023 25  22 - 32 mmol/L Final   Glucose, Bld 06/09/2023 128 (H)  70 - 99 mg/dL Final   Glucose reference range applies only to samples taken after fasting for at least 8 hours.   BUN 06/09/2023 22  8 - 23 mg/dL Final   Creatinine 27/25/3664 0.92  0.44 - 1.00 mg/dL Final   Calcium 40/34/7425 11.5 (H)  8.9 - 10.3 mg/dL Final   Total Protein 95/63/8756 8.4 (H)  6.5 - 8.1 g/dL Final   Albumin 43/32/9518 4.0  3.5 - 5.0 g/dL Final   AST 84/16/6063 102 (H)  15 - 41 U/L Final   ALT 06/09/2023 119 (H)  0 - 44 U/L Final   Alkaline Phosphatase 06/09/2023 80  38 - 126 U/L Final   Total Bilirubin 06/09/2023 0.6  <1.2 mg/dL Final   GFR, Estimated 06/09/2023 >60  >60 mL/min Final   Comment: (NOTE) Calculated using the CKD-EPI Creatinine Equation (2021)    Anion gap 06/09/2023 12  5 - 15 Final   Performed at [facility], [address] [place], [place], [address]   WBC Count 06/09/2023 10.6 (H)  4.0 - 10.5 K/uL Final   RBC 06/09/2023 3.80 (L)  3.87 - 5.11 MIL/uL Final   Hemoglobin 06/09/2023 13.4  12.0 - 15.0 g/dL Final   HCT 09/32/3557 38.7  36.0 - 46.0 % Final   MCV 06/09/2023 101.8 (H)  80.0 - 100.0 fL Final   MCH 06/09/2023 35.3 (H)  26.0 - 34.0 pg Final   MCHC 06/09/2023 34.6  30.0 - 36.0 g/dL Final   RDW 32/20/2542 13.4  11.5  - 15.5 % Final   Platelet Count 06/09/2023 223  150 - 400 K/uL Final   nRBC 06/09/2023 0.00  0.0 - 0.2 % Final   Neutrophils Relative % 06/09/2023 55  % Final   Neutro Abs 06/09/2023 6.0  1.7 - 7.7 K/uL Final   Lymphocytes Relative 06/09/2023 33  % Final   Lymphs Abs 06/09/2023 3.5  0.7 - 4.0 K/uL Final   Monocytes Relative 06/09/2023 9  % Final   Monocytes Absolute 06/09/2023 1.0  0.1 - 1.0 K/uL Final   Eosinophils Relative 06/09/2023 1  % Final   Eosinophils Absolute 06/09/2023 0.1  0.0 - 0.5 K/uL Final   Basophils Relative 06/09/2023 1  % Final   Basophils Absolute 06/09/2023 0.1  0.0 - 0.1 K/uL Final   Immature Granulocytes 06/09/2023 1  % Final   Abs Immature Granulocytes 06/09/2023 0.05  0.00 - 0.07 K/uL Final   nRBC 06/09/2023 0  0 /100 WBC Final   Performed at [facility], [address], [place], [place], [address]   Phosphorus 06/09/2023 3.4  2.5 - 4.6 mg/dL Final   Performed at [facility] [address], [place], [place] [address]   Vit D, 25-Hydroxy 06/09/2023 36.83  30 - 100 ng/mL Final   Comment: (NOTE) Vitamin D deficiency has been defined by the Institute of Medicine  and an Endocrine Society practice guideline as a level of serum 25-OH  vitamin D less than 20 ng/mL (1,2). The Endocrine Society went on to  further define vitamin  D insufficiency as a level between 21 and 29  ng/mL (2).  1. IOM (Institute of Medicine). 2010. Dietary reference intakes for  calcium and D. [place]: [employer]. 2. Holick MF, Binkley Kistler, Bischoff-Ferrari HA, et al. Evaluation,  treatment, and prevention of vitamin D deficiency: an Endocrine  Society clinical practice guideline, JCEM. 2011 Jul; 96(7): 1911-30.  Performed at [facility], [address], [place], [place] [address]    Vitamin A (Retinoic Acid) 06/09/2023 37.8  22.0 - 69.5 ug/dL Final   Comment: (NOTE) Reference intervals for vitamin A  determined from [facility] internal studies. Individuals with vitamin A less than 20 ug/dL are considered vitamin A deficient and those with serum concentrations less than 10 ug/dL are considered severely deficient. This test was developed and its performance characteristics determined by [facility]. It has not been cleared or approved by the Food and Drug Administration. Performed At: [facility] [address] [place], [place] [id number] Jolene Schimke MD [id number]    PTH-related peptide 06/09/2023 <2.0  pmol/L Final   Comment: (NOTE) This test was developed and its performance characteristics determined by [facility]. It has not been cleared or approved by the Food and Drug Administration. Reference Range: All Ages: <2.0 The PTHrP assay should not be used to exclude cancer or screen tumor patients for humoral hypercalcemia of malignancy (HHM). The results should always be assessed in conjunction with the patient's medical history, clinical examination, and other findings. If test results are clinically discordant, please contact the laboratory. Performed At: [facility] [address] [place], [place] [id number] Ethel Rana MD [id number]    PTH 06/09/2023 24  15 - 65 pg/mL Final   Comment: (NOTE) Performed At: [facility] [address] [place], [place] [id number] Jolene Schimke MD [id number]    Calcium, Ionized, Serum 06/09/2023 5.7 (H)  4.5 - 5.6 mg/dL Final   Comment: (NOTE) Performed At: [facility] [address] [place], [place] [id number] Jolene Schimke MD [id number]    Kappa free light chain 06/09/2023 58.6 (H)  3.3 - 19.4 mg/L Final   Lambda free light chains 06/09/2023 116.0 (H)  5.7 - 26.3 mg/L Final   Kappa, lambda light chain ratio 06/09/2023 0.51  0.26 - 1.65 Final   Comment: (NOTE) Performed At: [facility] [address] [place], [place] [id number] Jolene Schimke MD [id number]    IgG  (Immunoglobin G), Serum 06/09/2023 1,682 (H)  586 - 1,602 mg/dL Final   IgA 42/70/6237 1,172 (H)  64 - 422 mg/dL Final   Comment: (NOTE) Results confirmed on dilution.    IgM (Immunoglobulin M), Srm 06/09/2023 22 (L)  26 - 217 mg/dL Final   Result confirmed on concentration.   Total Protein ELP 06/09/2023 7.7  6.0 - 8.5 g/dL Corrected   Albumin SerPl Elph-Mcnc 06/09/2023 3.4  2.9 - 4.4 g/dL Corrected   Alpha 1 62/83/1517 0.3  0.0 - 0.4 g/dL Corrected   Alpha2 Glob SerPl Elph-Mcnc 06/09/2023 0.8  0.4 - 1.0 g/dL Corrected   B-Globulin SerPl Elph-Mcnc 06/09/2023 2.1 (H)  0.7 - 1.3 g/dL Corrected   Gamma Glob SerPl Elph-Mcnc 06/09/2023 1.2  0.4 - 1.8 g/dL Corrected   M Protein SerPl Elph-Mcnc 06/09/2023 0.9 (H)  Not Observed g/dL Corrected   Globulin, Total 06/09/2023 4.3 (H)  2.2 - 3.9 g/dL Corrected   Albumin/Glob SerPl 06/09/2023 0.8  0.7 - 1.7 Corrected   IFE 1 06/09/2023 Comment (A)   Corrected   Comment: (  NOTE) Immunofixation shows IgA monoclonal protein with lambda light chain specificity.    Please Note 06/09/2023 Comment   Corrected   Comment: (NOTE) Protein electrophoresis scan will follow via computer, mail, or courier delivery. Performed At: [facility] [address] [place], [place] [id number] Jolene Schimke MD FA:[id number]    TSH 06/09/2023 2.481  0.350 - 4.500 uIU/mL Final   Comment: Performed by a 3rd Generation assay with a functional sensitivity of <=0.01 uIU/mL. Performed at [facility], [address], [place], [place] [address]   Appointment on 06/04/2023  Component Date Value Ref Range Status   AFP-Tumor Marker 06/04/2023 5.8  ng/mL Final   Comment: Reference Range:   <6.1 The use of AFP as a tumor marker in pregnant females is not recommended. . . This test was performed using the Beckman Coulter chemiluminescent method. Values obtained from different assay methods cannot be used interchangeably. AFP levels, regardless  of value, should not be interpreted as absolute evidence of the presence or absence of disease. .    INR 06/04/2023 1.3 (H)  0.8 - 1.0 ratio Final   Prothrombin Time 06/04/2023 13.7 (H)  9.6 - 13.1 sec Final   Sodium 06/04/2023 136  135 - 145 mEq/L Final   Potassium 06/04/2023 3.9  3.5 - 5.1 mEq/L Final   Chloride 06/04/2023 103  96 - 112 mEq/L Final   CO2 06/04/2023 23  19 - 32 mEq/L Final   Glucose, Bld 06/04/2023 123 (H)  70 - 99 mg/dL Final   BUN 52/84/1324 20  6 - 23 mg/dL Final   Creatinine, Ser 06/04/2023 0.77  0.40 - 1.20 mg/dL Final   Total Bilirubin 06/04/2023 0.5  0.2 - 1.2 mg/dL Final   Alkaline Phosphatase 06/04/2023 79  39 - 117 U/L Final   AST 06/04/2023 59 (H)  0 - 37 U/L Final   ALT 06/04/2023 69 (H)  0 - 35 U/L Final   Total Protein 06/04/2023 7.9  6.0 - 8.3 g/dL Final   Albumin 40/04/2724 3.6  3.5 - 5.2 g/dL Final   GFR 36/64/4034 76.96  >60.00 mL/min Final   Calculated using the CKD-EPI Creatinine Equation (2021)   Calcium 06/04/2023 10.4  8.4 - 10.5 mg/dL Final   WBC 74/25/9563 9.3  4.0 - 10.5 K/uL Final   RBC 06/04/2023 3.79 (L)  3.87 - 5.11 Mil/uL Final   Hemoglobin 06/04/2023 13.6  12.0 - 15.0 g/dL Final   HCT 87/56/4332 40.4  36.0 - 46.0 % Final   MCV 06/04/2023 106.6 (H)  78.0 - 100.0 fl Final   MCHC 06/04/2023 33.6  30.0 - 36.0 g/dL Final   RDW 95/18/8416 13.9  11.5 - 15.5 % Final   Platelets 06/04/2023 207.0  150.0 - 400.0 K/uL Final   Neutrophils Relative % 06/04/2023 56.5  43.0 - 77.0 % Final   Lymphocytes Relative 06/04/2023 24.8  12.0 - 46.0 % Final   Monocytes Relative 06/04/2023 13.1 (H)  3.0 - 12.0 % Final   Eosinophils Relative 06/04/2023 4.6  0.0 - 5.0 % Final   Basophils Relative 06/04/2023 1.0  0.0 - 3.0 % Final   Neutro Abs 06/04/2023 5.3  1.4 - 7.7 K/uL Final   Lymphs Abs 06/04/2023 2.3  0.7 - 4.0 K/uL Final   Monocytes Absolute 06/04/2023 1.2 (H)  0.1 - 1.0 K/uL Final   Eosinophils Absolute 06/04/2023 0.4  0.0 - 0.7 K/uL Final    Basophils Absolute 06/04/2023 0.1  0.0 - 0.1 K/uL Final  RADIOGRAPHIC STUDIES: I have personally reviewed the radiological images as listed and agree with the findings in the report  No results found.  ASSESSMENT/PLAN Cancer Staging  No matching staging information was found for the patient.    No problem-specific Assessment & Plan notes found for this encounter.    No orders of the defined types were placed in this encounter.     minutes was spent in patient care.  This included time spent preparing to see the patient (e.g., review of tests), obtaining and/or reviewing separately obtained history, counseling and educating the patient/family/caregiver, ordering medications, tests, or procedures; documenting clinical information in the electronic or other health record, independently interpreting results and communicating results to the patient/family/caregiver as well as coordination of care.       All questions were answered. The patient knows to call the clinic with any problems, questions or concerns.  This note was electronically signed.    Loni Muse, MD  06/21/2023 2:52 PM

## 2023-06-22 LAB — BETA 2 MICROGLOBULIN, SERUM: Beta-2 Microglobulin: 2.6 mg/L — ABNORMAL HIGH (ref 0.6–2.4)

## 2023-06-22 LAB — CALCIUM, IONIZED: Calcium, Ionized, Serum: 5.3 mg/dL (ref 4.5–5.6)

## 2023-06-23 LAB — AFP TUMOR MARKER: AFP, Serum, Tumor Marker: 5.8 ng/mL (ref 0.0–9.2)

## 2023-07-19 ENCOUNTER — Other Ambulatory Visit: Payer: Self-pay | Admitting: Hematology and Oncology

## 2023-07-19 DIAGNOSIS — Z862 Personal history of diseases of the blood and blood-forming organs and certain disorders involving the immune mechanism: Secondary | ICD-10-CM

## 2023-07-20 ENCOUNTER — Other Ambulatory Visit: Payer: Self-pay | Admitting: Oncology

## 2023-07-20 ENCOUNTER — Inpatient Hospital Stay (HOSPITAL_BASED_OUTPATIENT_CLINIC_OR_DEPARTMENT_OTHER): Payer: Medicare Other | Admitting: Oncology

## 2023-07-20 ENCOUNTER — Inpatient Hospital Stay: Payer: Medicare Other

## 2023-07-20 ENCOUNTER — Encounter: Payer: Self-pay | Admitting: Oncology

## 2023-07-20 VITALS — BP 103/81 | HR 80 | Temp 98.4°F | Resp 20 | Ht 62.0 in | Wt 196.9 lb

## 2023-07-20 DIAGNOSIS — D693 Immune thrombocytopenic purpura: Secondary | ICD-10-CM | POA: Diagnosis not present

## 2023-07-20 DIAGNOSIS — Z9081 Acquired absence of spleen: Secondary | ICD-10-CM

## 2023-07-20 DIAGNOSIS — D126 Benign neoplasm of colon, unspecified: Secondary | ICD-10-CM

## 2023-07-20 DIAGNOSIS — D472 Monoclonal gammopathy: Secondary | ICD-10-CM

## 2023-07-20 DIAGNOSIS — Z862 Personal history of diseases of the blood and blood-forming organs and certain disorders involving the immune mechanism: Secondary | ICD-10-CM

## 2023-07-20 LAB — CBC WITH DIFFERENTIAL (CANCER CENTER ONLY)
Abs Immature Granulocytes: 0.04 10*3/uL (ref 0.00–0.07)
Basophils Absolute: 0.1 10*3/uL (ref 0.0–0.1)
Basophils Relative: 1 %
Eosinophils Absolute: 0.1 10*3/uL (ref 0.0–0.5)
Eosinophils Relative: 1 %
HCT: 35.8 % — ABNORMAL LOW (ref 36.0–46.0)
Hemoglobin: 12.7 g/dL (ref 12.0–15.0)
Immature Granulocytes: 0 %
Lymphocytes Relative: 29 %
Lymphs Abs: 2.8 10*3/uL (ref 0.7–4.0)
MCH: 35.1 pg — ABNORMAL HIGH (ref 26.0–34.0)
MCHC: 35.5 g/dL (ref 30.0–36.0)
MCV: 98.9 fL (ref 80.0–100.0)
Monocytes Absolute: 0.9 10*3/uL (ref 0.1–1.0)
Monocytes Relative: 9 %
Neutro Abs: 5.9 10*3/uL (ref 1.7–7.7)
Neutrophils Relative %: 60 %
Platelet Count: 195 10*3/uL (ref 150–400)
RBC: 3.62 MIL/uL — ABNORMAL LOW (ref 3.87–5.11)
RDW: 13.3 % (ref 11.5–15.5)
WBC Count: 9.8 10*3/uL (ref 4.0–10.5)
nRBC: 0 % (ref 0.0–0.2)
nRBC: 0 /100{WBCs}

## 2023-07-20 LAB — CMP (CANCER CENTER ONLY)
ALT: 60 U/L — ABNORMAL HIGH (ref 0–44)
AST: 57 U/L — ABNORMAL HIGH (ref 15–41)
Albumin: 3.9 g/dL (ref 3.5–5.0)
Alkaline Phosphatase: 104 U/L (ref 38–126)
Anion gap: 13 (ref 5–15)
BUN: 21 mg/dL (ref 8–23)
CO2: 21 mmol/L — ABNORMAL LOW (ref 22–32)
Calcium: 9.7 mg/dL (ref 8.9–10.3)
Chloride: 104 mmol/L (ref 98–111)
Creatinine: 0.89 mg/dL (ref 0.44–1.00)
GFR, Estimated: >60 mL/min (ref >60)
Glucose, Bld: 114 mg/dL — ABNORMAL HIGH (ref 70–99)
Potassium: 4.2 mmol/L (ref 3.5–5.1)
Sodium: 138 mmol/L (ref 135–145)
Total Bilirubin: 0.4 mg/dL (ref 0.0–1.2)
Total Protein: 8 g/dL (ref 6.5–8.1)

## 2023-07-20 NOTE — Progress Notes (Signed)
 [facility]  [address] 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  She has been in remission since November 2022 after receiving rituximab  for 4 cycles.  We will plan continued follow-up.   Hypercalcemia New mild hypercalcemia.  Her only symptom is fatigue.  Thyroid  and parathyroid  were normal but she was found to have a monoclonal spike. Her calcium  level was 10.4 and is back to normal today at 9.7. She has hypothyroidism and is on levothyroxine  50 mcg daily.  I will have her hold calcium /vitamin D , vitamin A  and furosemide .    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  I will repeat labs today and order a 24 hour urine for UPEP and total protein. I have explained this to her and will plan to repeat the SPEP and quantitative immunoglobulins every 3 months. Her beta 2 microglobulin is mildly elevated at 2.6 and I will repeat that today. Her total protein decreased from 8.5 to 8.0 today.   Tobacco abuse She started smoking at age 62 and so she has been smoking approximately 1 pack per day for 50+ years and continues to smoke despite numerous discussions about smoking cessation.  She finally quit smoking in February 2023, but she continues to vape daily.  She has never had low-dose CT chest for lung cancer screening.  Will need to discuss at future appointment.   Leukocytosis Chronic mild leukocytosis, which is stable.   Cirrhosis of liver with ascites (HCC) Cirrhosis.  Recent liver ultrasound was stable.  AFP was checked last time and is normal.  Peptic ulcer  disease She was found to have a gastric ulcer in July of 2023 as well as esophagitis.  Colon polyps These were tubular adenomas in July of 2023 so repeat was recommended in 3 years.   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  I tried to explain this to her and told her we would simply monitor it every 3 months but I would like to check a 24 hour urine for UPEP and total protein.  This is an IgA lambda but the level has come down from 1172 to 1051 today.  Her M spike is 0.9 and I will repeat that today along with a repeat beta 2 microglobulin since it was mildly elevated at 2.6.  Her IgG was slightly elevated last time at 1682 but is back to the normal range today at 1454. Her total protein has decreased from 8.5 to normal at 8.0 and the slightly elevated calcium  of 10.4 is now normal at 9.7 today. WBC's are normal, with hemoglobin of 12.7 and platelet count of 195,000.  She has chronic shortness of breath which is stable.  She reports occasional night sweats. She denies pain. Her appetite is good. Her weight is stable.  Recent liver ultrasound looked good and her AFP is normal.  She is on midodrine  for hypotension.   She had her annual mammogram on December 2 and this is clear.I will  see her back in 3 months with CBC,  CMP, Beta 2 microglobulin, SPEP, and quantitative immunoglobulins. She will collect a 24 hour urine for UPEP and total protein in January. I answered her questions. The patient understands the plans discussed today and is in agreement with them.  She knows to contact our office if she develops concerns prior to her next appointment.    60 minutes was spent in patient care.  This included time spent preparing to see the patient (e.g., review of tests), obtaining and/or reviewing separately obtained history, counseling and educating the patient/family/caregiver, ordering medications, tests, or procedures; documenting  clinical information in the electronic or other health record, independently interpreting results and communicating results to the patient/family/caregiver as well as coordination of care.    Jean VEAR Cornish, MD  [facility] OF [facility] [address] [place] [address] Dept: [phone] Dept Fax: [phone]   Orders Placed This Encounter  Procedures   CBC with Differential (Cancer Center Only)    Standing Status:   Future    Expected Date:   10/19/2023    Expiration Date:   07/19/2024   CMP (Cancer Center only)    Standing Status:   Future    Expected Date:   10/19/2023    Expiration Date:   07/19/2024   Beta 2 microglobulin, serum    Standing Status:   Future    Expected Date:   10/19/2023    Expiration Date:   07/19/2024   IgG, IgA, IgM    Standing Status:   Future    Expected Date:   10/19/2023    Expiration Date:   07/19/2024   Protein electrophoresis, serum    Standing Status:   Future    Expected Date:   10/19/2023    Expiration Date:   07/19/2024      CHIEF COMPLAINT:  CC: History of ITP with multiple recurrences, new MGUS IgA lambda in 2024  Current Treatment: Surveillance  HISTORY OF PRESENT ILLNESS:  Jean Kramer is a 72 year old with a history of ITP originally diagnosed in the 1960's.  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 She initially responded to that, but over the years has been on and off corticosteroids for relapsing ITP, and was also treated with danazol for a time.  Her ITP had been in remission for many years. She has also has had chronic leukocytosis.    Annual mammogram in May 2018 revealed an intraductal mass in the right breast at 10 o 'clock measuring 6 mm.  We recommended an ultrasound-guided biopsy which revealed  fibroadenomatoid and fibrocystic changes, including cystic and micro papillary apocrine metaplasia, apocrine adenosis, stromal fibrosis, and foci of pseudo angiomatous stromal hyperplasia and focal sclerosis.  It was recommended that she have excision of this area and that was performed in July by Dr. Prentice Conch.  The final pathology revealed atrophic breast tissue with fibrocystic changes and foci of usual ductal hyperplasia but no intraductal papilloma, fibroadenoma, atypia or malignancy.  There was another lumpectomy sample, which revealed micropapillary ductal hyperplasia, focal ectatic ducts, and patchy periductal chronic inflammation.  The other area of lumpectomy had morphologic changes similar to the original biopsy.  We had been seeing her regularly, but then she was lost to  follow-up after May 2019. She had a diagnostic right mammogram in April 2020, which revealed evolving fat necrosis.  Annual bilateral screening mammograms in October 2022 and November 2023 did not reveal any evidence of malignancy.   She was admitted to [facility] in July 2022 due to developing an upper GI bleed from taking BC powders.  We had advised her to discontinue these previously.  She switched to Tylenol  for her pain.  While in the hospital, her platelet count dropped into the teens, and she was given steroids and IVIG.   She had extensive bruising. She was in the hospital for a total of 15 days and her platelet count had gone up to 63,000 at the time of discharge.  She was referred back to us .  She was given a prescription for prednisone  at the time of discharge, but Dr. Cornelius advised her not to take this, as her platelet count was normal. When we saw her in early August her platelet count was up to 168,000.  She had leukocytosis with white count was 14,000.  Her bruises had resolved and she was doing well.   She was then admitted to [facility] in August 2022 due to severe thrombocytopenia with a platelet  count less than 5,000.  She presented to the hospital after she removed a scab from her knee, and the wound continued to ooze blood.  She received Nplate  injection x2, as well as 2 units of PRBCs and 1 unit of platelets. Upon discharge, her platelet count had improved to 17,000.  She was advised to discontinue aspirin  81 mg.  She once again had extensive bruising, but also noted shortness of breath with exertion, insomnia, and occasional dizziness and lightheadedness.  We saw her for hospital follow-up and she had resolution of the leukocytosis and her platelet count was up  to 41,000.  Chemistries revealed worsening elevation of the liver transaminases with an SGOT of 117 and an SGPT of 90.  She states that she has known fatty liver.  As her platelets remained low, she was started on prednisone  60 mg daily. She initially had an excellent response to high-dose prednisone , but when we tapered her rapidly, she had recurrent severe thrombocytopenia.  We were unable to slowly taper the prednisone , so she went on to receive rituximab  weekly for 4 weeks with an excellent response.  She was rapidly tapered off of prednisone  while receiving rituximab .  Her platelets have remained normal since November 2022.     She was diagnosed with liver cirrhosis in 2023 and sees Dr. Leigh in [facility].  EGD in July 2023 revealed LA Grade A esophagitis fountolod focally at the GEJ. A plaque was found in the lower third of the esophagus, 35 cm from the incisors, could not be lavaged off. Biopsies were taken with a cold forceps for histology. The exam of the esophagus was otherwise normal. No varices. Diffuse mild inflammation characterized by erythema, friability and granularity was found in the entire examined stomach. One non-bleeding gastric ulcer with no stigmata of bleeding was found in the prepyloric region of the stomach, with some associated inflammatory nodularity. The lesion was 3 mm in largest dimension. Biopsies were  taken from the periphery of the lesion with a cold forceps for histology. The exam of the stomach was otherwise normal. No varices. Biopsies were taken with a cold forceps in the gastric body, at the incisura and in the gastric antrum for Helicobacter pylori testing. The examined duodenum was normal.  She had colonoscopy and repeat EGD in October 2023 with removal of several small colon polyps.  Pathology revealed tubular adenomas.  EGD revealed good healing of the gastric ulcer with no new findings.  Follow-up colonoscopy and EGD in 3 years was recommended.   INTERVAL HISTORY:  Jean Kramer is here today for repeat clinical assessment and is upset about the confusing news about her new MGUS.  I tried to explain this to her and told her we would simply monitor it every 3 months but I would like to check a 24 hour urine for UPEP and total protein.  This is an IgA lambda but the level has come down from 1172 to 1051 today.  Her M spike is 0.9 and I will repeat that today along with a repeat beta 2 microglobulin since it was mildly elevated at 2.6.  Her IgG was slightly elevated last time at 1682 but is back to the normal range today at 1454. Her total protein has decreased from 8.5 to normal at 8.0 and the slightly elevated calcium  of 10.4 is now normal at 9.7 today. WBC's are normal, with hemoglobin of 12.7 and platelet count of 195,000. She denies abnormal bleeding or bruising.  She has chronic mild nausea relieved with medication.  She reports intermittent abdominal cramping, but denies constipation.  She has chronic shortness of breath which is stable.  She reports occasional night sweats.  She denies fevers or chills. She denies pain. Her appetite is good. Her weight is stable.  Recent liver ultrasound looked good and her AFP is normal.  She is on midodrine  for hypotension.   She had her annual mammogram on December 2 and this is clear.I will see her back in 3 months with CBC,  CMP, Beta 2 microglobulin, SPEP, and  quantitative immunoglobulins.  REVIEW OF SYSTEMS:  Review of Systems  Constitutional:  Positive for diaphoresis (occasional night sweats) and fatigue. Negative for appetite change, chills, fever and unexpected weight change.  HENT:   Negative for lump/mass, mouth sores, nosebleeds and sore throat.   Eyes:  Positive for eye problems (occasional blurry, floaters). Negative for icterus.  Respiratory:  Positive for shortness of breath (chronic, mild with exertion). Negative for cough.   Cardiovascular:  Positive for leg swelling. Negative for chest pain and palpitations.  Gastrointestinal:  Positive for abdominal pain (intermittent abdominal cramping). Negative for blood in stool, constipation, diarrhea, nausea and vomiting.  Endocrine: Negative for hot flashes.  Genitourinary:  Negative for difficulty urinating, dysuria, frequency, hematuria, vaginal bleeding and vaginal discharge.   Musculoskeletal:  Negative for arthralgias (hands, cramping), back pain and myalgias.  Skin:  Negative for itching and rash.  Neurological:  Positive for numbness (Neuropathy bilateral feet). Negative for dizziness and headaches.  Hematological:  Negative for adenopathy. Does not bruise/bleed easily.  Psychiatric/Behavioral:  Negative for depression and sleep disturbance. The patient is not nervous/anxious.      VITALS:  Blood pressure 103/81, pulse 80, temperature 98.4 F (36.9 C), temperature source Oral, resp. rate 20, height 5' 2 (1.575 m), weight 196 lb 14.4 oz (89.3 kg), SpO2 98%.  Wt Readings from Last 3 Encounters:  07/20/23 196 lb 14.4 oz (89.3 kg)  06/21/23 194 lb 14.4 oz (88.4 kg)  06/09/23 196 lb 1.6 oz (89 kg)    Body mass index is 36.01 kg/m.  Performance status (ECOG): 2 - Symptomatic, <50% confined to bed  PHYSICAL EXAM:  Physical Exam Vitals and nursing note reviewed.  Constitutional:  General: She is not in acute distress.    Appearance: Normal appearance.  HENT:     Head:  Normocephalic and atraumatic.     Mouth/Throat:     Mouth: Mucous membranes are moist.     Pharynx: Oropharynx is clear. No oropharyngeal exudate or posterior oropharyngeal erythema.  Eyes:     General: No scleral icterus.    Extraocular Movements: Extraocular movements intact.     Conjunctiva/sclera: Conjunctivae normal.     Pupils: Pupils are equal, round, and reactive to light.  Cardiovascular:     Rate and Rhythm: Normal rate and regular rhythm.     Heart sounds: Normal heart sounds. No murmur heard.    No friction rub. No gallop.  Pulmonary:     Effort: Pulmonary effort is normal.     Breath sounds: Normal breath sounds. No wheezing, rhonchi or rales.  Abdominal:     General: There is no distension.     Palpations: Abdomen is soft. There is no mass.     Tenderness: There is no abdominal tenderness.  Musculoskeletal:        General: Normal range of motion.     Cervical back: Normal range of motion and neck supple. No tenderness.     Right lower leg: No edema.     Left lower leg: No edema.  Lymphadenopathy:     Cervical: No cervical adenopathy.  Skin:    General: Skin is warm and dry.     Coloration: Skin is not jaundiced.     Findings: No rash.  Neurological:     Mental Status: She is alert and oriented to person, place, and time.     Cranial Nerves: No cranial nerve deficit.  Psychiatric:        Mood and Affect: Mood normal.        Behavior: Behavior normal.        Thought Content: Thought content normal.     LABS:      Latest Ref Rng & Units 07/20/2023    1:11 PM 06/21/2023    3:41 PM 06/09/2023    1:48 PM  CBC  WBC 4.0 - 10.5 K/uL 9.8  9.2  10.6   Hemoglobin 12.0 - 15.0 g/dL 87.2  86.6  86.5   Hematocrit 36.0 - 46.0 % 35.8  38.6  38.7   Platelets 150 - 400 K/uL 195  216  223       Latest Ref Rng & Units 07/20/2023    1:11 PM 06/21/2023    3:41 PM 06/09/2023    1:48 PM  CMP  Glucose 70 - 99 mg/dL 885  77  871   BUN 8 - 23 mg/dL 21  22  22    Creatinine  0.44 - 1.00 mg/dL 9.10  9.15  9.07   Sodium 135 - 145 mmol/L 138  134  138   Potassium 3.5 - 5.1 mmol/L 4.2  4.0  4.3   Chloride 98 - 111 mmol/L 104  101  101   CO2 22 - 32 mmol/L 21  24  25    Calcium  8.9 - 10.3 mg/dL 9.7  89.5  88.4   Total Protein 6.5 - 8.1 g/dL 8.0  8.5  8.4   Total Bilirubin 0.0 - 1.2 mg/dL 0.4  0.5  0.6   Alkaline Phos 38 - 126 U/L 104  79  80   AST 15 - 41 U/L 57  87  102   ALT 0 - 44 U/L 60  105  119      Lab Results  Component Value Date   TOTALPROTELP 7.7 06/09/2023    Lab Results  Component Value Date   TIBC 345.8 05/08/2022   TIBC 330 02/08/2021   FERRITIN 271.8 05/08/2022   FERRITIN 137 02/08/2021   IRONPCTSAT 26.9 05/08/2022   IRONPCTSAT 11 02/08/2021     STUDIES:  No results found.    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 Her smoking use included cigarettes. She has never used smokeless tobacco. She reports that she does not drink alcohol and does not use drugs.The patient is alone today.  Allergies: No Known Allergies  Current Medications: Current  Outpatient Medications  Medication Sig Dispense Refill   alendronate (FOSAMAX) 70 MG tablet Take 70 mg by mouth every Friday.     Biotin w/ Vitamins C & E (HAIR/SKIN/NAILS PO) Take 1 capsule by mouth daily.     Budeson-Glycopyrrol-Formoterol  (BREZTRI AEROSPHERE) 160-9-4.8 MCG/ACT AERO 2 puffs     busPIRone  (BUSPAR ) 15 MG tablet Take 15 mg by mouth 2 (two) times daily.     Calcium  Carb-Cholecalciferol  (CALCIUM  + D3 PO) Take 1 tablet by mouth daily.     docusate sodium  (COLACE) 100 MG capsule Take 100 mg by mouth daily. 3 tabs at bedtime     Ferrous Sulfate (IRON PO) Take by mouth. 9 mg two times daily     furosemide  (LASIX ) 20 MG tablet Take 20 mg by mouth daily.     hydrOXYzine  (ATARAX ) 25 MG tablet Take 1 tablet by mouth 2 (two) times daily as needed for anxiety.     ipratropium-albuterol  (DUONEB) 0.5-2.5 (3) MG/3ML SOLN Inhale 3 mLs into the lungs every 6 (six) hours as needed for shortness of breath.     levothyroxine  (SYNTHROID ) 50 MCG tablet Mylan levothyroxine      linaclotide  (LINZESS ) 290 MCG CAPS capsule Take 1 capsule (290 mcg total) by mouth daily before breakfast. Lot: [id number], exp: 09-2023     lubiprostone (AMITIZA) 24 MCG capsule Take 24 mcg by mouth 2 (two) times daily with a meal.     metFORMIN (GLUCOPHAGE-XR) 500 MG 24 hr tablet Take 500 mg by mouth daily.     midodrine  (PROAMATINE ) 10 MG tablet Take 1 tablet (10 mg total) by mouth 3 (three) times daily with meals. 90 tablet 3   MOUNJARO 2.5 MG/0.5ML Pen Inject 2.5 mg into the skin once a week.  Takes it on Sat     Multiple Vitamin (MULTIVITAMIN WITH MINERALS) TABS tablet Take 1 tablet by mouth daily.     NON FORMULARY Stool softer pt take 1 a day     Omega-3 Fatty Acids (OMEGA 3 PO) Take 1 capsule by mouth daily.     omeprazole  (PRILOSEC) 40 MG capsule TAKE 1 CAPSULE BY MOUTH DAILY 90 capsule 3   ondansetron  (ZOFRAN ) 4 MG tablet Take 1 tablet (4 mg total) by mouth every 4 (four) hours as needed for nausea. 90 tablet 3    Oxycodone  HCl 20 MG TABS 1 tablet as needed     Plecanatide  (TRULANCE ) 3 MG TABS Take 3 mg by mouth daily. Exp: 08-2023, LOT: [id number] 9 tablet 0   pramipexole  (MIRAPEX ) 0.5 MG tablet Take 0.5 mg by mouth at bedtime.     QUEtiapine  (SEROQUEL ) 200 MG tablet Take 200 mg by mouth at bedtime.     VITAMIN A  PO Take 1 tablet by mouth daily.     No current facility-administered medications for this visit.

## 2023-07-22 DIAGNOSIS — R609 Edema, unspecified: Secondary | ICD-10-CM | POA: Insufficient documentation

## 2023-07-22 DIAGNOSIS — R600 Localized edema: Secondary | ICD-10-CM | POA: Insufficient documentation

## 2023-07-22 LAB — IGG, IGA, IGM
IgA: 1051 mg/dL — ABNORMAL HIGH (ref 64–422)
IgG (Immunoglobin G), Serum: 1454 mg/dL (ref 586–1602)
IgM (Immunoglobulin M), Srm: 19 mg/dL — ABNORMAL LOW (ref 26–217)

## 2023-07-23 DIAGNOSIS — K635 Polyp of colon: Secondary | ICD-10-CM | POA: Insufficient documentation

## 2023-07-25 LAB — PROTEIN ELECTROPHORESIS, SERUM
A/G Ratio: 0.9 (ref 0.7–1.7)
Albumin ELP: 3.6 g/dL (ref 2.9–4.4)
Alpha-1-Globulin: 0.3 g/dL (ref 0.0–0.4)
Alpha-2-Globulin: 0.8 g/dL (ref 0.4–1.0)
Beta Globulin: 1.9 g/dL — ABNORMAL HIGH (ref 0.7–1.3)
Gamma Globulin: 1.1 g/dL (ref 0.4–1.8)
Globulin, Total: 4 g/dL — ABNORMAL HIGH (ref 2.2–3.9)
M-Spike, %: 0.7 g/dL — ABNORMAL HIGH
Total Protein ELP: 7.6 g/dL (ref 6.0–8.5)

## 2023-08-02 ENCOUNTER — Telehealth: Payer: Self-pay

## 2023-08-02 ENCOUNTER — Encounter: Payer: Self-pay | Admitting: Oncology

## 2023-08-02 NOTE — Telephone Encounter (Signed)
 Attempted to contact patient. No answer.

## 2023-08-02 NOTE — Telephone Encounter (Signed)
-----   Message from Dellia Beckwith sent at 07/30/2023  7:02 AM EST ----- Regarding: call Tell her the special test is improved, doesn't have myeloma

## 2023-08-03 ENCOUNTER — Telehealth: Payer: Self-pay

## 2023-08-03 DIAGNOSIS — D472 Monoclonal gammopathy: Secondary | ICD-10-CM | POA: Insufficient documentation

## 2023-08-03 NOTE — Telephone Encounter (Signed)
-----   Message from Dellia Beckwith sent at 07/30/2023  7:02 AM EST ----- Regarding: call Tell her the special test is improved, doesn't have myeloma

## 2023-08-03 NOTE — Telephone Encounter (Signed)
 Attempted to contact patient. No answer.

## 2023-08-04 ENCOUNTER — Inpatient Hospital Stay: Payer: Medicare Other | Attending: Hematology and Oncology

## 2023-08-04 DIAGNOSIS — D472 Monoclonal gammopathy: Secondary | ICD-10-CM

## 2023-08-11 ENCOUNTER — Encounter: Payer: Self-pay | Admitting: Gastroenterology

## 2023-08-11 LAB — UPEP/UIFE/LIGHT CHAINS/TP, 24-HR UR
% BETA, Urine: 27.2 %
ALPHA 1 URINE: 5.4 %
Albumin, U: 38.2 %
Alpha 2, Urine: 14.6 %
Free Kappa Lt Chains,Ur: 3.82 mg/L (ref 1.17–86.46)
Free Kappa/Lambda Ratio: 1.21 — ABNORMAL LOW (ref 1.83–14.26)
Free Lambda Lt Chains,Ur: 3.17 mg/L (ref 0.27–15.21)
GAMMA GLOBULIN URINE: 14.6 %
Total Protein, Urine-Ur/day: 74 mg/(24.h) (ref 30–150)
Total Protein, Urine: 8.2 mg/dL
Total Volume: 900

## 2023-08-16 DIAGNOSIS — I361 Nonrheumatic tricuspid (valve) insufficiency: Secondary | ICD-10-CM | POA: Diagnosis not present

## 2023-09-27 ENCOUNTER — Ambulatory Visit: Payer: 59 | Admitting: Gastroenterology

## 2023-10-19 ENCOUNTER — Inpatient Hospital Stay: Payer: 59

## 2023-10-19 ENCOUNTER — Inpatient Hospital Stay: Payer: 59 | Admitting: Hematology and Oncology

## 2023-11-01 NOTE — Telephone Encounter (Signed)
 Patient has been scheduled for follow-up visit per 11/01/23 LOS.  Pt given an appt calendar with date and time.

## 2023-11-03 ENCOUNTER — Encounter: Payer: Self-pay | Admitting: Oncology

## 2023-11-03 ENCOUNTER — Inpatient Hospital Stay: Attending: Hematology and Oncology

## 2023-11-03 ENCOUNTER — Inpatient Hospital Stay: Admitting: Oncology

## 2023-11-03 ENCOUNTER — Other Ambulatory Visit: Payer: Self-pay | Admitting: Oncology

## 2023-11-03 ENCOUNTER — Telehealth: Payer: Self-pay | Admitting: Oncology

## 2023-11-03 VITALS — BP 103/66 | HR 85 | Temp 98.0°F | Resp 18 | Ht 62.0 in | Wt 193.4 lb

## 2023-11-03 DIAGNOSIS — F17210 Nicotine dependence, cigarettes, uncomplicated: Secondary | ICD-10-CM | POA: Insufficient documentation

## 2023-11-03 DIAGNOSIS — D472 Monoclonal gammopathy: Secondary | ICD-10-CM | POA: Diagnosis present

## 2023-11-03 DIAGNOSIS — D693 Immune thrombocytopenic purpura: Secondary | ICD-10-CM

## 2023-11-03 DIAGNOSIS — D72829 Elevated white blood cell count, unspecified: Secondary | ICD-10-CM | POA: Insufficient documentation

## 2023-11-03 DIAGNOSIS — K7460 Unspecified cirrhosis of liver: Secondary | ICD-10-CM | POA: Insufficient documentation

## 2023-11-03 DIAGNOSIS — R188 Other ascites: Secondary | ICD-10-CM | POA: Diagnosis not present

## 2023-11-03 DIAGNOSIS — Z79899 Other long term (current) drug therapy: Secondary | ICD-10-CM | POA: Diagnosis not present

## 2023-11-03 LAB — CBC WITH DIFFERENTIAL (CANCER CENTER ONLY)
Abs Immature Granulocytes: 0.08 10*3/uL — ABNORMAL HIGH (ref 0.00–0.07)
Basophils Absolute: 0.1 10*3/uL (ref 0.0–0.1)
Basophils Relative: 1 %
Eosinophils Absolute: 0.2 10*3/uL (ref 0.0–0.5)
Eosinophils Relative: 1 %
HCT: 38.8 % (ref 36.0–46.0)
Hemoglobin: 13.2 g/dL (ref 12.0–15.0)
Immature Granulocytes: 1 %
Lymphocytes Relative: 28 %
Lymphs Abs: 3.4 10*3/uL (ref 0.7–4.0)
MCH: 33.4 pg (ref 26.0–34.0)
MCHC: 34 g/dL (ref 30.0–36.0)
MCV: 98.2 fL (ref 80.0–100.0)
Monocytes Absolute: 1.1 10*3/uL — ABNORMAL HIGH (ref 0.1–1.0)
Monocytes Relative: 9 %
Neutro Abs: 7.4 10*3/uL (ref 1.7–7.7)
Neutrophils Relative %: 60 %
Platelet Count: 233 10*3/uL (ref 150–400)
RBC: 3.95 MIL/uL (ref 3.87–5.11)
RDW: 13.8 % (ref 11.5–15.5)
WBC Count: 12.3 10*3/uL — ABNORMAL HIGH (ref 4.0–10.5)
nRBC: 0 % (ref 0.0–0.2)
nRBC: 0 /100{WBCs}

## 2023-11-03 LAB — CMP (CANCER CENTER ONLY)
ALT: 64 U/L — ABNORMAL HIGH (ref 0–44)
AST: 69 U/L — ABNORMAL HIGH (ref 15–41)
Albumin: 3.9 g/dL (ref 3.5–5.0)
Alkaline Phosphatase: 117 U/L (ref 38–126)
Anion gap: 11 (ref 5–15)
BUN: 18 mg/dL (ref 8–23)
CO2: 24 mmol/L (ref 22–32)
Calcium: 9.7 mg/dL (ref 8.9–10.3)
Chloride: 103 mmol/L (ref 98–111)
Creatinine: 0.8 mg/dL (ref 0.44–1.00)
GFR, Estimated: >60 mL/min (ref >60)
Glucose, Bld: 107 mg/dL — ABNORMAL HIGH (ref 70–99)
Potassium: 4.1 mmol/L (ref 3.5–5.1)
Sodium: 138 mmol/L (ref 135–145)
Total Bilirubin: 0.4 mg/dL (ref 0.0–1.2)
Total Protein: 8 g/dL (ref 6.5–8.1)

## 2023-11-03 NOTE — Progress Notes (Signed)
 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  She has been in remission since November 2022 after receiving rituximab  for 4 cycles.  We will plan continued follow-up.   Hypercalcemia New mild hypercalcemia.  Her only symptom is fatigue.  Thyroid  and parathyroid  were normal but she was found to have a monoclonal spike. Her calcium  level was 10.4 and went back to normal and remains normal today. I will have her hold calcium /vitamin D , vitamin A  and furosemide .    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  I will repeat labs today and order a 24 hour urine for UPEP and total protein. I have explained this to her and will plan to repeat the SPEP and quantitative immunoglobulins every 3 months. Her beta 2 microglobulin is mildly elevated at 2.6 and I will repeat that today. Her total protein decreased from 8.5 to 8.0 today. If her M-spike is higher we will repeat this in 3 months. If it is stable we can wait 6 months.    Tobacco abuse She started smoking at age 2 and so she has been smoking approximately 1 pack per day for 50+ years and continues to smoke despite numerous discussions about smoking cessation.  She finally quit smoking in February 2023, but she continues to vape daily.  She has never had low-dose CT chest for lung cancer screening.  Will need to discuss at future appointment. She has probably had recently.   Leukocytosis Chronic mild leukocytosis, which is stable.   Cirrhosis of liver with ascites (HCC) Cirrhosis.  Recent liver ultrasound was stable.   AFP was checked last time and is normal.  Peptic ulcer disease She was found to have a gastric ulcer in July of 2023 as well as esophagitis.  Colon polyps These were tubular adenomas in July of 2023 so repeat was recommended in 3 years.  Plan: She informed me she was hospitalized at [facility] with pneumonia for 8 days in January. She has a elevated WBC of 12.3, hemoglobin of 13.2, and platelet count of 233,000. Her CMP is normal other than an AST of 69 and ALT of 64. Her SPEP, quantitative immunoglobulins, and beta 2 microglobulins are pending and I will call her with the results. I will see her back in 3 months with CBC, CMP, B-12, folate, and protime. The patient understands the plans discussed today and is in agreement with them.  She knows to contact our office if she develops concerns prior to her next appointment.  I provided 13 minutes of face-to-face time during this this encounter and > 50% was spent counseling as documented under my assessment and plan.   [name], MD  [facility] - [facility] DEPT OF [facility] [address] [place] [address] Dept: [phone] Dept Fax: [phone]   No orders of the defined types were placed in this encounter.   CHIEF COMPLAINT:  CC: History of ITP with multiple recurrences, new MGUS IgA lambda in 2024  Current Treatment: Surveillance  HISTORY OF PRESENT ILLNESS:  Jean Kramer is a 73 year old with a history of ITP originally diagnosed in the 48's.  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 She initially responded to that, but over the years has been on and off corticosteroids for relapsing ITP, and was also treated with danazol for a time.  Her ITP had been in remission for many years. She has also has had chronic  leukocytosis.    Annual mammogram in May 2018 revealed an intraductal mass in the right breast at 10 o 'clock measuring 6 mm.  We recommended an ultrasound-guided biopsy which revealed fibroadenomatoid and fibrocystic changes, including cystic and micro papillary apocrine metaplasia, apocrine adenosis, stromal fibrosis, and foci of pseudo angiomatous stromal hyperplasia and focal sclerosis.  It was recommended that she have excision of this area and that was performed in July by Dr. Andrez Keel.  The final pathology revealed atrophic breast tissue with fibrocystic changes and foci of usual ductal hyperplasia but no intraductal papilloma, fibroadenoma, atypia or malignancy.  There was another lumpectomy sample, which revealed micropapillary ductal hyperplasia, focal ectatic ducts, and patchy periductal chronic inflammation.  The other area of lumpectomy had morphologic changes similar to the original biopsy.  We had been seeing her regularly, but then she was lost to follow-up after May 2019. She had a diagnostic right mammogram in April 2020, which revealed evolving fat necrosis.  Annual bilateral screening mammograms in October 2022 and November 2023 did not reveal any evidence of malignancy.   She was admitted to [facility] in July 2022 due to developing an upper GI bleed from taking BC powders.  We had advised her to discontinue these previously.  She switched to Tylenol  for her pain.  While in the hospital, her platelet count dropped into the teens, and she was given steroids and IVIG.   She had extensive bruising. She was in the hospital for a total of 15 days and her platelet count had gone up to 63,000 at the time of discharge.  She was referred back to us .  She was given a prescription for prednisone  at the time of discharge, but Dr. Almer Jacobson advised her not to take this, as her platelet count was normal. When we saw her in early August her platelet count was up to 168,000.  She had  leukocytosis with white count was 14,000.  Her bruises had resolved and she was doing well.   She was then admitted to [facility] in August 2022 due to severe thrombocytopenia with a platelet count less than 5,000.  She presented to the hospital after she removed a scab from her knee, and the wound continued to ooze blood.  She received Nplate  injection x2, as well as 2 units of PRBCs and 1 unit of platelets. Upon discharge, her platelet count had improved to 17,000.  She was advised to discontinue aspirin  81 mg.  She once again had extensive bruising, but also noted shortness of breath with exertion, insomnia, and occasional dizziness and lightheadedness.  We saw her for hospital follow-up and she had resolution of the leukocytosis and her platelet count was up  to 41,000.  Chemistries revealed worsening elevation of the liver transaminases with an SGOT of 117 and an SGPT of 90.  She states that she has known fatty liver.  As her  platelets remained low, she was started on prednisone  60 mg daily. She initially had an excellent response to high-dose prednisone , but when we tapered her rapidly, she had recurrent severe thrombocytopenia.  We were unable to slowly taper the prednisone , so she went on to receive rituximab  weekly for 4 weeks with an excellent response.  She was rapidly tapered off of prednisone  while receiving rituximab .  Her platelets have remained normal since November 2022.     She was diagnosed with liver cirrhosis in 2023 and sees Dr. General Kenner in [facility].  EGD in July 2023 revealed LA Grade A esophagitis fountolod focally at the GEJ. A plaque was found in the lower third of the esophagus, 35 cm from the incisors, could not be lavaged off. Biopsies were taken with a cold forceps for histology. The exam of the esophagus was otherwise normal. No varices. Diffuse mild inflammation characterized by erythema, friability and granularity was found in the entire examined stomach. One non-bleeding  gastric ulcer with no stigmata of bleeding was found in the prepyloric region of the stomach, with some associated inflammatory nodularity. The lesion was 3 mm in largest dimension. Biopsies were taken from the periphery of the lesion with a cold forceps for histology. The exam of the stomach was otherwise normal. No varices. Biopsies were taken with a cold forceps in the gastric body, at the incisura and in the gastric antrum for Helicobacter pylori testing. The examined duodenum was normal.  She had colonoscopy and repeat EGD in October 2023 with removal of several small colon polyps.  Pathology revealed tubular adenomas.  EGD revealed good healing of the gastric ulcer with no new findings.  Follow-up colonoscopy and EGD in 3 years was recommended.   INTERVAL HISTORY:  Nickol is here today for repeat clinical assessment for her history of ITP with multiple recurrences and new MGUS IgA lambda in 2024. She has had a splenectomy twice. Patient states that she feels well but complains of left leg pain starting from her groin radiating to her knee rating a 6/10. She informed me she was hospitalized at [facility] with pneumonia for 8 days in January. She has a elevated WBC of 12.3, hemoglobin of 13.2, and platelet count of 233,000. Her CMP is normal other than an AST of 69 and ALT of 64. Her SPEP, quantitative immunoglobulins, and beta 2 microglobulins are pending and I will call her with the results. I will see her back in 3 months with CBC, CMP, B-12, folate, and protime. She denies signs of infection such as sore throat, sinus drainage, cough, or urinary symptoms.  She denies fevers or recurrent chills. She denies nausea, vomiting, chest pain, dyspnea or cough. Her appetite is great and her weight has decreased 3 pounds over last 4 months .    REVIEW OF SYSTEMS:  Review of Systems  Constitutional:  Positive for diaphoresis (occasional night sweats) and fatigue. Negative for appetite change, chills, fever  and unexpected weight change.  HENT:  Negative.  Negative for hearing loss, lump/mass, mouth sores, nosebleeds, sore throat, tinnitus, trouble swallowing and voice change.   Eyes:  Positive for eye problems (occasional blurry, floaters). Negative for icterus.  Respiratory:  Positive for shortness of breath (chronic, mild with exertion). Negative for chest tightness, cough, hemoptysis and wheezing.   Cardiovascular:  Negative for chest pain, leg swelling and palpitations.  Gastrointestinal:  Negative for abdominal distention, abdominal pain, blood in stool, constipation, diarrhea, nausea, rectal pain and vomiting.  Endocrine: Negative.  Negative  for hot flashes.  Genitourinary: Negative.  Negative for bladder incontinence, difficulty urinating, dyspareunia, dysuria, frequency, hematuria, menstrual problem, nocturia, pelvic pain, vaginal bleeding and vaginal discharge.   Musculoskeletal:  Positive for arthralgias (hands, cramping). Negative for back pain, flank pain, gait problem, myalgias, neck pain and neck stiffness.       Left leg pain starting from her groin radiating to her knee rating a 6/10  Skin: Negative.  Negative for itching, rash and wound.  Neurological:  Positive for numbness (Neuropathy bilateral feet). Negative for dizziness, extremity weakness, gait problem, headaches, light-headedness, seizures and speech difficulty.  Hematological:  Negative for adenopathy. Bruises/bleeds easily.  Psychiatric/Behavioral: Negative.  Negative for confusion, decreased concentration, depression, sleep disturbance and suicidal ideas. The patient is not nervous/anxious.      VITALS:  Blood pressure 103/66, pulse 85, temperature 98 F (36.7 C), temperature source Oral, resp. rate 18, height 5\' 2"  (1.575 m), weight 193 lb 6.4 oz (87.7 kg), SpO2 98%.  Wt Readings from Last 3 Encounters:  11/11/23 194 lb 8 oz (88.2 kg)  11/03/23 193 lb 6.4 oz (87.7 kg)  07/20/23 196 lb 14.4 oz (89.3 kg)    Body mass  index is 35.37 kg/m.  Performance status (ECOG): 1 - Symptomatic but completely ambulatory  PHYSICAL EXAM:  Physical Exam Vitals and nursing note reviewed.  Constitutional:      General: She is not in acute distress.    Appearance: Normal appearance. She is normal weight. She is not ill-appearing, toxic-appearing or diaphoretic.  HENT:     Head: Normocephalic and atraumatic.     Right Ear: Tympanic membrane, ear canal and external ear normal. There is no impacted cerumen.     Left Ear: Tympanic membrane, ear canal and external ear normal. There is no impacted cerumen.     Nose: Nose normal. No congestion or rhinorrhea.     Mouth/Throat:     Mouth: Mucous membranes are moist.     Pharynx: Oropharynx is clear. No oropharyngeal exudate or posterior oropharyngeal erythema.  Eyes:     General: No scleral icterus.       Right eye: No discharge.        Left eye: No discharge.     Extraocular Movements: Extraocular movements intact.     Conjunctiva/sclera: Conjunctivae normal.     Pupils: Pupils are equal, round, and reactive to light.  Neck:     Vascular: No carotid bruit.  Cardiovascular:     Rate and Rhythm: Normal rate and regular rhythm.     Pulses: Normal pulses.     Heart sounds: Normal heart sounds. No murmur heard.    No friction rub. No gallop.  Pulmonary:     Effort: Pulmonary effort is normal. No respiratory distress.     Breath sounds: Normal breath sounds. No stridor. No wheezing, rhonchi or rales.  Chest:     Chest wall: No tenderness.  Abdominal:     General: Bowel sounds are normal. There is no distension.     Palpations: Abdomen is soft. There is no hepatomegaly, splenomegaly or mass.     Tenderness: There is no abdominal tenderness. There is no right CVA tenderness, left CVA tenderness, guarding or rebound.     Hernia: No hernia is present.  Musculoskeletal:        General: No swelling, tenderness, deformity or signs of injury. Normal range of motion.      Cervical back: Normal range of motion and neck supple. No rigidity or tenderness.  Right lower leg: No edema.     Left lower leg: No edema.  Lymphadenopathy:     Cervical: No cervical adenopathy.     Right cervical: No superficial, deep or posterior cervical adenopathy.    Left cervical: No superficial, deep or posterior cervical adenopathy.     Upper Body:     Right upper body: No supraclavicular, axillary or pectoral adenopathy.     Left upper body: No supraclavicular, axillary or pectoral adenopathy.  Skin:    General: Skin is warm and dry.     Coloration: Skin is not jaundiced or pale.     Findings: Bruising present. No lesion or rash.     Comments: Bruises on both arms  Neurological:     General: No focal deficit present.     Mental Status: She is alert and oriented to person, place, and time. Mental status is at baseline.     Cranial Nerves: No cranial nerve deficit.     Sensory: No sensory deficit.     Motor: No weakness.     Coordination: Coordination normal.     Gait: Gait normal.     Deep Tendon Reflexes: Reflexes normal.  Psychiatric:        Mood and Affect: Mood normal.        Behavior: Behavior normal.        Thought Content: Thought content normal.        Judgment: Judgment normal.    LABS:      Latest Ref Rng & Units 11/11/2023    3:31 PM 11/03/2023    1:46 PM 07/20/2023    1:11 PM  CBC  WBC 4.0 - 10.5 K/uL 12.4  12.3  9.8   Hemoglobin 12.0 - 15.0 g/dL 60.4  54.0  98.1   Hematocrit 36.0 - 46.0 % 38.8  38.8  35.8   Platelets 150.0 - 400.0 K/uL 248.0  233  195       Latest Ref Rng & Units 11/11/2023    3:31 PM 11/03/2023    1:46 PM 07/20/2023    1:11 PM  CMP  Glucose 70 - 99 mg/dL 191  478  295   BUN 6 - 23 mg/dL 22  18  21    Creatinine 0.40 - 1.20 mg/dL 6.21  3.08  6.57   Sodium 135 - 145 mEq/L 135  138  138   Potassium 3.5 - 5.1 mEq/L 4.5  4.1  4.2   Chloride 96 - 112 mEq/L 103  103  104   CO2 19 - 32 mEq/L 24  24  21    Calcium  8.4 - 10.5 mg/dL  9.8  9.7  9.7   Total Protein 6.0 - 8.3 g/dL 8.0  8.0  8.0   Total Bilirubin 0.2 - 1.2 mg/dL 0.5  0.4  0.4   Alkaline Phos 39 - 117 U/L 98  117  104   AST 0 - 37 U/L 64  69  57   ALT 0 - 35 U/L 57  64  60    Lab Results  Component Value Date   TOTALPROTELP 7.6 11/03/2023   ALBUMINELP 3.3 11/03/2023   A1GS 0.3 11/03/2023   A2GS 0.9 11/03/2023   BETS 2.0 (H) 11/03/2023   GAMS 1.1 11/03/2023   MSPIKE 0.8 (H) 11/03/2023   SPEI Comment 11/03/2023   Lab Results  Component Value Date   TIBC 345.8 05/08/2022   TIBC 330 02/08/2021   FERRITIN 271.8 05/08/2022   FERRITIN 137 02/08/2021  IRONPCTSAT 26.9 05/08/2022   IRONPCTSAT 11 02/08/2021   Lab Results  Component Value Date   TSH 1.477 06/21/2023    STUDIES:  No results found.    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 Her smoking use included cigarettes. She has never used smokeless tobacco. She reports that she does not drink alcohol and does not use drugs.The patient is alone today.  Allergies: No Known Allergies  Current Medications: Current  Outpatient Medications  Medication Sig Dispense Refill   albuterol  (VENTOLIN  HFA) 108 (90 Base) MCG/ACT inhaler Inhale 2 puffs into the lungs every 4 (four) hours as needed.     alendronate (FOSAMAX) 70 MG tablet Take 70 mg by mouth every Friday.     Biotin w/ Vitamins C & E (HAIR/SKIN/NAILS PO) Take 1 capsule by mouth daily.     Budeson-Glycopyrrol-Formoterol  (BREZTRI AEROSPHERE) 160-9-4.8 MCG/ACT AERO 2 puffs     busPIRone  (BUSPAR ) 15 MG tablet Take 15 mg by mouth 2 (two) times daily.     docusate sodium  (COLACE) 100 MG capsule Take 100 mg by mouth daily. 3 tabs at bedtime     Ferrous Sulfate (IRON PO) Take by mouth. 9 mg two times daily     furosemide  (LASIX ) 20 MG tablet Take 20 mg by mouth daily.     hydrOXYzine  (ATARAX ) 25 MG tablet Take 1 tablet by mouth 2 (two) times daily as needed for anxiety.     ipratropium-albuterol  (DUONEB) 0.5-2.5 (3) MG/3ML SOLN Inhale 3 mLs into the lungs every 6 (six) hours as needed for shortness of breath.     levothyroxine  (SYNTHROID ) 50 MCG tablet Mylan levothyroxine      linaclotide  (LINZESS ) 290 MCG CAPS capsule Take 1 capsule (290 mcg total) by mouth daily before breakfast. Lot: [id number], exp: 09-2023     lubiprostone (AMITIZA) 24 MCG capsule Take 24 mcg by mouth 2 (two) times daily with a meal.     metFORMIN (GLUCOPHAGE-XR) 500 MG 24 hr tablet Take 500 mg by mouth daily.     midodrine  (PROAMATINE ) 10 MG tablet Take 1 tablet (10 mg total) by mouth 3 (three) times daily with meals. 90 tablet 3   MOUNJARO 2.5 MG/0.5ML Pen Inject 2.5 mg into the skin once a week. Takes it on Sat     omeprazole  (PRILOSEC) 40 MG capsule TAKE 1 CAPSULE BY MOUTH DAILY 90 capsule 3   ondansetron  (ZOFRAN ) 4 MG tablet Take  1 tablet (4 mg total) by mouth every 4 (four) hours as needed for nausea. 90 tablet 3   Oxycodone  HCl 20 MG TABS 1 tablet as needed     Plecanatide  (TRULANCE ) 3 MG TABS Take 3 mg by mouth daily. Exp: 08-2023, LOT: [id number] 9 tablet 0   pramipexole  (MIRAPEX ) 0.5 MG  tablet Take 0.5 mg by mouth at bedtime.     QUEtiapine  (SEROQUEL ) 200 MG tablet Take 200 mg by mouth at bedtime.     No current facility-administered medications for this visit.    I,Jasmine M Lassiter,acting as a scribe for [name], MD.,have documented all relevant documentation on the behalf of [name], MD,as directed by  [name], MD while in the presence of [name], MD.

## 2023-11-03 NOTE — Telephone Encounter (Signed)
 Patient has been scheduled for follow-up visit per 11/03/23 LOS.  Pt given an appt calendar with date and time.

## 2023-11-04 LAB — IGG, IGA, IGM
IgA: 1095 mg/dL — ABNORMAL HIGH (ref 64–422)
IgG (Immunoglobin G), Serum: 1449 mg/dL (ref 586–1602)
IgM (Immunoglobulin M), Srm: 17 mg/dL — ABNORMAL LOW (ref 26–217)

## 2023-11-04 LAB — BETA 2 MICROGLOBULIN, SERUM: Beta-2 Microglobulin: 3 mg/L — ABNORMAL HIGH (ref 0.6–2.4)

## 2023-11-05 LAB — PROTEIN ELECTROPHORESIS, SERUM
A/G Ratio: 0.8 (ref 0.7–1.7)
Albumin ELP: 3.3 g/dL (ref 2.9–4.4)
Alpha-1-Globulin: 0.3 g/dL (ref 0.0–0.4)
Alpha-2-Globulin: 0.9 g/dL (ref 0.4–1.0)
Beta Globulin: 2 g/dL — ABNORMAL HIGH (ref 0.7–1.3)
Gamma Globulin: 1.1 g/dL (ref 0.4–1.8)
Globulin, Total: 4.3 g/dL — ABNORMAL HIGH (ref 2.2–3.9)
M-Spike, %: 0.8 g/dL — ABNORMAL HIGH
Total Protein ELP: 7.6 g/dL (ref 6.0–8.5)

## 2023-11-11 ENCOUNTER — Other Ambulatory Visit (INDEPENDENT_AMBULATORY_CARE_PROVIDER_SITE_OTHER)

## 2023-11-11 ENCOUNTER — Encounter: Payer: Self-pay | Admitting: Gastroenterology

## 2023-11-11 ENCOUNTER — Ambulatory Visit (INDEPENDENT_AMBULATORY_CARE_PROVIDER_SITE_OTHER): Admitting: Gastroenterology

## 2023-11-11 VITALS — BP 102/66 | HR 89 | Ht 62.0 in | Wt 194.5 lb

## 2023-11-11 DIAGNOSIS — E611 Iron deficiency: Secondary | ICD-10-CM | POA: Diagnosis not present

## 2023-11-11 DIAGNOSIS — Z862 Personal history of diseases of the blood and blood-forming organs and certain disorders involving the immune mechanism: Secondary | ICD-10-CM

## 2023-11-11 DIAGNOSIS — K7581 Nonalcoholic steatohepatitis (NASH): Secondary | ICD-10-CM

## 2023-11-11 DIAGNOSIS — K7460 Unspecified cirrhosis of liver: Secondary | ICD-10-CM

## 2023-11-11 DIAGNOSIS — K5903 Drug induced constipation: Secondary | ICD-10-CM

## 2023-11-11 DIAGNOSIS — T402X5A Adverse effect of other opioids, initial encounter: Secondary | ICD-10-CM

## 2023-11-11 DIAGNOSIS — D693 Immune thrombocytopenic purpura: Secondary | ICD-10-CM

## 2023-11-11 LAB — CBC WITH DIFFERENTIAL/PLATELET
Basophils Absolute: 0.1 10*3/uL (ref 0.0–0.1)
Basophils Relative: 1.2 % (ref 0.0–3.0)
Eosinophils Absolute: 0.1 10*3/uL (ref 0.0–0.7)
Eosinophils Relative: 0.9 % (ref 0.0–5.0)
HCT: 38.8 % (ref 36.0–46.0)
Hemoglobin: 12.9 g/dL (ref 12.0–15.0)
Lymphocytes Relative: 21.4 % (ref 12.0–46.0)
Lymphs Abs: 2.7 10*3/uL (ref 0.7–4.0)
MCHC: 33.3 g/dL (ref 30.0–36.0)
MCV: 99.9 fl (ref 78.0–100.0)
Monocytes Absolute: 1.4 10*3/uL — ABNORMAL HIGH (ref 0.1–1.0)
Monocytes Relative: 11.2 % (ref 3.0–12.0)
Neutro Abs: 8.1 10*3/uL — ABNORMAL HIGH (ref 1.4–7.7)
Neutrophils Relative %: 65.3 % (ref 43.0–77.0)
Platelets: 248 10*3/uL (ref 150.0–400.0)
RBC: 3.88 Mil/uL (ref 3.87–5.11)
RDW: 13.9 % (ref 11.5–15.5)
WBC: 12.4 10*3/uL — ABNORMAL HIGH (ref 4.0–10.5)

## 2023-11-11 LAB — COMPREHENSIVE METABOLIC PANEL WITH GFR
ALT: 57 U/L — ABNORMAL HIGH (ref 0–35)
AST: 64 U/L — ABNORMAL HIGH (ref 0–37)
Albumin: 3.7 g/dL (ref 3.5–5.2)
Alkaline Phosphatase: 98 U/L (ref 39–117)
BUN: 22 mg/dL (ref 6–23)
CO2: 24 meq/L (ref 19–32)
Calcium: 9.8 mg/dL (ref 8.4–10.5)
Chloride: 103 meq/L (ref 96–112)
Creatinine, Ser: 0.75 mg/dL (ref 0.40–1.20)
GFR: 79.18 mL/min (ref >60.00)
Glucose, Bld: 121 mg/dL — ABNORMAL HIGH (ref 70–99)
Potassium: 4.5 meq/L (ref 3.5–5.1)
Sodium: 135 meq/L (ref 135–145)
Total Bilirubin: 0.5 mg/dL (ref 0.2–1.2)
Total Protein: 8 g/dL (ref 6.0–8.3)

## 2023-11-11 NOTE — Progress Notes (Signed)
 Agree with assessment and plan as outlined.

## 2023-11-11 NOTE — Progress Notes (Signed)
 Chief Complaint: Cirrhosis Primary GI MD: Dr. General Kenner  HPI: 73 year old female history of cirrhosis, ITP, COPD, bipolar, hypothyroidism, chronic pain on chronic narcotics presents for follow-up of cirrhosis  Previous history she has a longstanding history of ITP treated with splenectomy at age 70, has been on periodic steroids and rituximab  for ITP in the past. She had some elevated liver enzymes a few years ago AST, ALT, alk phos elevated to mid to high 100s.  Her brother had cirrhosis from alcoholism.  The patient denies any significant alcohol use at all.   She had a CT scan when she was in the hospital in May 2023, which showed fatty liver and suggested changes of cirrhosis.  She is never had jaundice, no history of variceal bleeding, no history of hepatic encephalopathy, no history of ascites.  She had a serologic work-up with this for chronic liver disease which was negative other than a mildly positive ANA but her IgG and smooth muscle antibody were negative.  Previous iron studies back in May showed an iron deficiency.  She is not immune to hepatitis B but she was immune to hepatitis A.  She had hepatitis B vaccine   Cirrhosis Evaluation: - Etiology: MASH - Complications: Compensated - HCC screening: 05/2023 RUQ ultrasound without liver lesions - Variceal screening: EGD October 2024 with no varices, repeat 3 years (2027) - Serologic evaluation: Negative other than mildly positive ANA (IgG and smooth muscle antibody negative) - Viral hepatitis vaccination: Up-to-date - Liver biopsy: None - Medications: none - MELD 3.0: 31 May 2023   Discussed the use of AI scribe software for clinical note transcription with the patient, who gave verbal consent to proceed.  History of Present Illness She has a history of constipation, for which she is taking Amitiza, two pills a day. Her bowel movements have improved with this medication. She had previously discussed this issue with another  doctor and had insurance challenges, but she has since obtained medication coverage.  In terms of gastrointestinal symptoms, no episodes of ascites, hepatic encephalopathy, or jaundice. She occasionally experiences stomach cramps but notes they are less frequent than before. She adheres to her medication regimen and rarely consumes alcohol, with her last drink being a margarita once or twice a year.   PREVIOUS GI WORKUP    CT abdomen pelvis with contrast 11/25/21: EXAM: CT ABDOMEN AND PELVIS WITH CONTRAST   TECHNIQUE: Multidetector CT imaging of the abdomen and pelvis was performed using the standard protocol following bolus administration of intravenous contrast.   RADIATION DOSE REDUCTION: This exam was performed according to the departmental dose-optimization program which includes automated exposure control, adjustment of the mA and/or kV according to patient size and/or use of iterative reconstruction technique.   CONTRAST:  OMNIPAQUE  IOHEXOL  300 MG/ML  SOLN   COMPARISON:  None Available.   FINDINGS: Evaluation of this exam is limited due to respiratory motion artifact.   Lower chest: The visualized lung bases are clear. There is coronary vascular calcification.   No intra-abdominal free air or free fluid.   Hepatobiliary: Fatty liver. Slight irregularity of the liver contour suspicious for early cirrhosis. No intrahepatic biliary ductal dilatation. Faint subcentimeter right hepatic hypodense focus is too small to characterize. There is cholecystectomy. No retained calcified stone noted in the central CBD.   Pancreas: Unremarkable. No pancreatic ductal dilatation or surrounding inflammatory changes.   Spleen: Splenectomy.   Adrenals/Urinary Tract: The adrenal glands are unremarkable. There is no hydronephrosis on either side. There is  symmetric enhancement and excretion of contrast by both kidneys. The visualized ureters and urinary bladder appear  unremarkable.   Stomach/Bowel: Several small scattered sigmoid diverticula without active inflammatory changes. There is no bowel obstruction or active inflammation. The appendix is normal.   Vascular/Lymphatic: Advanced aortoiliac atherosclerotic disease. The IVC is unremarkable no portal venous gas. There is no adenopathy.   Reproductive: Hysterectomy.  No adnexal masses.   Other: None   Musculoskeletal: Degenerative changes of spine. No acute osseous pathology.   IMPRESSION: 1. No acute intra-abdominal or pelvic pathology. 2. Fatty liver with findings suspicious for cirrhosis. 3. Small scattered sigmoid diverticula. No bowel obstruction. Normal appendix. 4. Aortic Atherosclerosis (ICD10-I70.0).     EGD 02/03/22: - LA Grade A esophagitis was found focally at the GEJ. - A plaque was found in the lower third of the esophagus, 35 cm from the incisors, could not be lavaged off. Biopsies were taken with a cold forceps for histology. - The exam of the esophagus was otherwise normal. No varices - Diffuse mild inflammation characterized by erythema, friability and granularity was found in the entire examined stomach. - One non-bleeding gastric ulcer with no stigmata of bleeding was found in the prepyloric region of the stomach, with some associated inflammatory nodularity. The lesion was 3 mm in largest dimension. Biopsies were taken from the periphery of the lesion with a cold forceps for histology. - The exam of the stomach was otherwise normal. No varices - Biopsies were taken with a cold forceps in the gastric body, at the incisura and in the gastric antrum for Helicobacter pylori testing. - The examined duodenum was normal.   1. Surgical [P], gastric antrum (ulcer) REACTIVE GASTROPATHY NEGATIVE FOR EROSION OR ULCERATION NEGATIVE FOR H. PYLORI, INTESTINAL METAPLASIA, DYSPLASIA AND CARCINOMA 2. Surgical [P], gastric antrum and gastric body REACTIVE GASTROPATHY WITH MINIMAL  CHRONIC GASTRITIS NEGATIVE FOR H. PYLORI, INTESTINAL METAPLASIA, DYSPLASIA AND CARCINOMA 3. Surgical [P], distal esophagus (35cm) MILD ACUTE ESOPHAGITIS WITH SURFACE HYPERKERATOSIS FUNGAL STAIN NEGATIVE (PASF, ADEQUATE CONTROL) NEGATIVE FOR GLANDULAR EPITHELIUM, EOSINOPHILS, DYSPLASIA AND CARCINOMA     EGD 05/19/22: - Esophagogastric landmarks were identified: the Z-line was found at 38 cm, the gastroesophageal junction was found at 38 cm and the upper extent of the gastric folds was found at 38 cm from the incisors. Findings: - The exam of the esophagus was otherwise normal. No varices. - Patchy mildly erythematous mucosa was found in the gastric antrum. Recently biopsied and no evidence of H pylori, no further biopsies obtained today. - The exam of the stomach was otherwise normal. No varices. - The examined duodenum was normal.      Colonoscopy 05/19/22: - One 5 mm polyp in the ascending colon, removed with a cold snare. Resected and retrieved. - Five 3 to 5 mm polyps in the transverse colon, removed with a cold snare. Resected and retrieved. - One 5 to 7 mm polyp in the sigmoid colon, removed with a cold snare. Resected and retrieved. - Diverticulosis in the sigmoid colon. - The examination was otherwise normal. No cause for iron deficiency on this exam, which was most likely due to prior gastric ulcer / NSAID use   1. Surgical [P], colon, sigmoid, polyp (1) - TUBULAR ADENOMA. - NEGATIVE FOR HIGH-GRADE DYSPLASIA OR MALIGNANCY. 2. Surgical [P], colon, transverse and ascending, polyp (6) - TUBULAR ADENOMA(S) (MULTIPLE FRAGMENTS). - NEGATIVE FOR HIGH-GRADE DYSPLASIA OR MALIGNANCY.   Repeat EGD and colonoscopy 3 years     RUQ US  12/10/22: IMPRESSION: 1.  No acute process. 2. Coarsened increased echogenicity throughout the liver. No focal lesion.  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     alendronate (FOSAMAX) 70 MG tablet Take 70 mg by mouth every Friday.     Biotin w/ Vitamins C & E (HAIR/SKIN/NAILS PO) Take 1 capsule by mouth daily.     Budeson-Glycopyrrol-Formoterol  (BREZTRI AEROSPHERE) 160-9-4.8 MCG/ACT AERO 2 puffs     busPIRone  (BUSPAR ) 15 MG tablet Take 15 mg by mouth 2 (two) times daily.     docusate sodium  (COLACE) 100 MG capsule Take 100 mg by mouth daily. 3 tabs at bedtime     Ferrous Sulfate (IRON PO) Take by mouth. 9 mg two times daily     furosemide  (LASIX ) 20 MG tablet Take 20 mg by mouth daily.     hydrOXYzine  (ATARAX ) 25 MG tablet Take 1 tablet by mouth 2 (two) times daily as needed for anxiety.     ipratropium-albuterol  (DUONEB) 0.5-2.5 (3) MG/3ML SOLN Inhale 3 mLs into the lungs every 6 (six) hours as needed for shortness of breath.     levothyroxine  (SYNTHROID ) 50 MCG tablet Mylan levothyroxine      lubiprostone (AMITIZA) 24 MCG capsule Take 24 mcg by mouth 2 (two) times daily with a meal.     metFORMIN (GLUCOPHAGE-XR) 500 MG 24 hr tablet Take 500 mg by mouth daily.     midodrine  (PROAMATINE ) 10 MG tablet Take 1 tablet (10 mg total) by mouth 3 (three) times  daily with meals. 90 tablet 3   MOUNJARO 2.5 MG/0.5ML Pen Inject 2.5 mg into the skin once a week. Takes it on Sat     omeprazole  (PRILOSEC) 40 MG capsule TAKE 1 CAPSULE BY MOUTH DAILY 90 capsule 3   ondansetron  (ZOFRAN ) 4 MG tablet Take 1 tablet (4 mg total) by mouth every 4 (four) hours as needed for nausea. 90 tablet 3   Oxycodone  HCl 20 MG TABS 1 tablet as needed     Plecanatide  (TRULANCE ) 3 MG TABS Take 3 mg by mouth daily. Exp: 08-2023, LOT: [id number] 9 tablet 0   pramipexole  (MIRAPEX ) 0.5 MG tablet Take 0.5 mg by mouth at bedtime.     QUEtiapine  (SEROQUEL ) 200 MG tablet Take 200 mg by mouth at bedtime.     linaclotide  (LINZESS ) 290 MCG CAPS capsule Take 1 capsule (290 mcg total) by mouth daily before breakfast. Lot: [id number], exp: 09-2023     No current facility-administered medications for this visit.    Allergies  as of 11/11/2023   (No Known Allergies)    Family History  Problem Relation Age of Onset   Brain cancer Brother    Colon cancer Neg Hx    Rectal cancer Neg Hx    Stomach cancer Neg Hx    Esophageal cancer Neg Hx     Social History   Socioeconomic History   Marital status: Widowed    Spouse name: 1   Number of children: Not on file   Years of education: Not on file   Highest education level: Not on file  Occupational History   Not on file  Tobacco Use   Smoking status: Former    Types: Cigarettes   Smokeless tobacco: Never  Vaping Use   Vaping status: Some Days  Substance and Sexual Activity   Alcohol use: Never   Drug use: Never   Sexual activity: Not on file  Other Topics Concern   Not on file  Social History Narrative   Not on file   Social Drivers of Health   Financial Resource Strain: Not on file  Food Insecurity: Not on file  Transportation Needs: Not on file  Physical Activity: Not on file  Stress: Not on file  Social Connections: Not on file  Intimate Partner Violence: Not on file    Review of Systems:    Constitutional: No weight  loss, fever, chills, weakness or fatigue HEENT: Eyes: No change in vision               Ears, Nose, Throat:  No change in hearing or congestion Skin: No rash or itching Cardiovascular: No chest pain, chest pressure or palpitations   Respiratory: No SOB or cough Gastrointestinal: See HPI and otherwise negative Genitourinary: No dysuria or change in urinary frequency Neurological: No headache, dizziness or syncope Musculoskeletal: No new muscle or joint pain Hematologic: No bleeding or bruising Psychiatric: No history of depression or anxiety    Physical Exam:  Vital signs: BP 102/66   Pulse 89   Ht 5\' 2"  (1.575 m)   Wt 194 lb 8 oz (88.2 kg)   BMI 35.57 kg/m   Constitutional: NAD, alert and cooperative Head:  Normocephalic and atraumatic. Eyes:   PEERL, EOMI. No icterus. Conjunctiva pink. Respiratory: Respirations even and unlabored. Lungs clear to auscultation bilaterally.   No wheezes, crackles, or rhonchi.  Cardiovascular:  Regular rate and rhythm. No peripheral edema, cyanosis or pallor.  Gastrointestinal:  Soft, nondistended, nontender. No rebound or guarding. Normal bowel sounds. No appreciable masses or hepatomegaly. Rectal:  Declines Msk:  Symmetrical without gross deformities. Without edema, no deformity or joint abnormality.  Neurologic:  Alert and  oriented x4;  grossly normal neurologically.  Skin:   Dry and intact without significant lesions or rashes. Psychiatric: Oriented to person, place and time. Demonstrates good judgement and reason without abnormal affect or behaviors.  Physical Exam ABDOMEN: Normal bowel sounds   RELEVANT LABS AND IMAGING: CBC    Component Value Date/Time   WBC 12.3 (H) 11/03/2023 1346   WBC 9.3 06/04/2023 0759   RBC 3.95 11/03/2023 1346   HGB 13.2 11/03/2023 1346   HCT 38.8 11/03/2023 1346   PLT 233 11/03/2023 1346   MCV 98.2 11/03/2023 1346   MCV 101 (A) 06/16/2021 0000   MCH 33.4 11/03/2023 1346   MCHC 34.0 11/03/2023 1346    RDW 13.8 11/03/2023 1346   LYMPHSABS 3.4 11/03/2023 1346   MONOABS 1.1 (H) 11/03/2023 1346   EOSABS 0.2 11/03/2023 1346  BASOSABS 0.1 11/03/2023 1346    CMP     Component Value Date/Time   NA 138 11/03/2023 1346   NA 140 04/17/2022 0000   K 4.1 11/03/2023 1346   CL 103 11/03/2023 1346   CO2 24 11/03/2023 1346   GLUCOSE 107 (H) 11/03/2023 1346   BUN 18 11/03/2023 1346   BUN 27 (A) 04/17/2022 0000   CREATININE 0.80 11/03/2023 1346   CALCIUM  9.7 11/03/2023 1346   PROT 8.0 11/03/2023 1346   ALBUMIN 3.9 11/03/2023 1346   AST 69 (H) 11/03/2023 1346   ALT 64 (H) 11/03/2023 1346   ALKPHOS 117 11/03/2023 1346   BILITOT 0.4 11/03/2023 1346   GFRNONAA >60 11/03/2023 1346     Assessment/Plan:    Compensated MASH cirrhosis Diagnosed 2022 thought to be from fatty liver.  MELD 3.0: 31 May 2023.  Repeat ultrasound November 2021 with no lesions.  Chronic mild elevation in transaminases likely due to underlying cirrhosis.  No history of ascites, encephalopathy, jaundice. - CBC, CMP, PT/INR for updated MELD score - Due for HCC screening, will schedule repeat ultrasound - Repeat EGD for variceal screening 2027 - Follow-up 6 months  Iron deficiency EGD July 2024 with esophagitis, esophageal plaque, gastric ulcer.  Biopsies negative.  History of Goody powder use prior to EGD. repeat EGD October 2024 showed healing of ulcer and no esophageal varices, Colonoscopy October 2024 with some polyps with repeat EGD/Colonoscopy recommended 3 years.  Recent Hgb 13.2 - Resolution of anemia with normal hemoglobin - Repeat EGD/colonoscopy 2027  Opioid-induced constipation Previously did well on Linzess /Trulance  but had insurance difficulties.  Currently on Amitiza 24 mcg with relief and doing well - Continue Amitiza 24 mcg  Chronic ITP  CVA Not on anticoagulation   This visit required 36 minutes of patient care (this includes precharting, chart review, review of results, face-to-face time  used for counseling as well as treatment plan and follow-up. The patient was provided an opportunity to ask questions and all were answered. The patient agreed with the plan and demonstrated an understanding of the instructions.   [facility] 11/11/2023, 3:34 PM  Cc: Georgean Kindle, MD

## 2023-11-11 NOTE — Patient Instructions (Signed)
 _______________________________________________________ Your provider has requested that you go to the basement level for lab work before leaving today. Press "B" on the elevator. The lab is located at the first door on the left as you exit the elevator.  You will be contacted by [facility] Scheduling in the next 2 days to arrange a abdominal u/s.  The number on your caller ID will be [phone], please answer when they call.  If you have not heard from them in 2 days please call [phone] to schedule.    Please follow up with Dr. General Kenner in 6 months   If your blood pressure at your visit was 140/90 or greater, please contact your primary care physician to follow up on this.  _______________________________________________________  If you are age 85 or older, your body mass index should be between 23-30. Your Body mass index is 35.57 kg/m. If this is out of the aforementioned range listed, please consider follow up with your Primary Care Provider.  If you are age 9 or younger, your body mass index should be between 19-25. Your Body mass index is 35.57 kg/m. If this is out of the aformentioned range listed, please consider follow up with your Primary Care Provider.   ________________________________________________________  The [facility] [place] providers would like to encourage you to use MYCHART to communicate with providers for non-urgent requests or questions.  Due to long hold times on the telephone, sending your provider a message by [facility] may be a faster and more efficient way to get a response.  Please allow 48 business hours for a response.  Please remember that this is for non-urgent requests.  _______________________________________________________

## 2023-11-15 ENCOUNTER — Telehealth: Payer: Self-pay

## 2023-11-15 ENCOUNTER — Other Ambulatory Visit: Payer: Self-pay | Admitting: Oncology

## 2023-11-15 DIAGNOSIS — D472 Monoclonal gammopathy: Secondary | ICD-10-CM

## 2023-11-15 NOTE — Telephone Encounter (Signed)
-----   Message from [name] sent at 11/15/2023 10:29 AM EDT ----- Regarding: call Tell her abnormal protein (M spike) is just sl higher, will recheck in 3 mo.  Also pls print her last chest CT report

## 2023-11-15 NOTE — Telephone Encounter (Signed)
Attempted to contact patient. No answer at this time. 

## 2023-11-17 ENCOUNTER — Telehealth: Payer: Self-pay

## 2023-11-17 ENCOUNTER — Ambulatory Visit (HOSPITAL_COMMUNITY)
Admission: RE | Admit: 2023-11-17 | Discharge: 2023-11-17 | Disposition: A | Discharge status: Home / Self Care | Source: Ambulatory Visit | Attending: Gastroenterology | Admitting: Gastroenterology

## 2023-11-17 DIAGNOSIS — K7460 Unspecified cirrhosis of liver: Secondary | ICD-10-CM | POA: Diagnosis present

## 2023-11-17 NOTE — Telephone Encounter (Signed)
 Order for AFP entered

## 2023-11-17 NOTE — Telephone Encounter (Signed)
-----   Message from Ace Holder sent at 11/17/2023  1:26 PM EDT ----- Regarding: RE: due for hcc screening Yes, recommend AFP along with right upper quadrant ultrasound for HCC screening every 6 months. ----- Message ----- From: Alwyn Baas, CMA Sent: 11/17/2023  11:23 AM EDT To: Garr Kalata, PA-C; # Subject: FW: due for hcc screening                      Patient had recent labs and U/S with [facility] but did not get AFP.  Should we send her back to the lab now?  Thanks ----- Message ----- From: Alwyn Baas, CMA Sent: 11/17/2023  12:00 AM EDT To: Alwyn Baas, CMA Subject: due for hcc screening                          Patient due for labs and RUQ U/S in May 2025   CBC, CMET, INR, AFP

## 2023-11-18 ENCOUNTER — Telehealth: Payer: Self-pay

## 2023-11-18 ENCOUNTER — Ambulatory Visit (HOSPITAL_COMMUNITY): Admission: RE | Admit: 2023-11-18 | Source: Ambulatory Visit

## 2023-11-18 NOTE — Telephone Encounter (Signed)
-----   Message from [name] sent at 11/15/2023 10:29 AM EDT ----- Regarding: call Tell her abnormal protein (M spike) is just sl higher, will recheck in 3 mo.  Also pls print her last chest CT report

## 2023-11-24 ENCOUNTER — Other Ambulatory Visit

## 2023-11-24 DIAGNOSIS — K7460 Unspecified cirrhosis of liver: Secondary | ICD-10-CM

## 2023-11-25 ENCOUNTER — Encounter: Payer: Self-pay | Admitting: Gastroenterology

## 2023-11-25 LAB — AFP TUMOR MARKER: AFP-Tumor Marker: 5.5 ng/mL

## 2023-12-07 ENCOUNTER — Ambulatory Visit: Payer: 59 | Admitting: Oncology

## 2024-02-02 ENCOUNTER — Ambulatory Visit: Admitting: Oncology

## 2024-02-02 ENCOUNTER — Other Ambulatory Visit

## 2024-02-04 ENCOUNTER — Other Ambulatory Visit: Payer: Self-pay | Admitting: Oncology

## 2024-02-04 ENCOUNTER — Inpatient Hospital Stay: Attending: Hematology and Oncology

## 2024-02-04 ENCOUNTER — Other Ambulatory Visit: Payer: Self-pay | Admitting: Pharmacist

## 2024-02-04 ENCOUNTER — Telehealth: Payer: Self-pay | Admitting: Oncology

## 2024-02-04 ENCOUNTER — Encounter: Payer: Self-pay | Admitting: Oncology

## 2024-02-04 ENCOUNTER — Inpatient Hospital Stay

## 2024-02-04 ENCOUNTER — Inpatient Hospital Stay (HOSPITAL_BASED_OUTPATIENT_CLINIC_OR_DEPARTMENT_OTHER): Admitting: Oncology

## 2024-02-04 VITALS — BP 86/53 | HR 73 | Temp 98.4°F | Resp 20 | Ht 62.0 in | Wt 196.5 lb

## 2024-02-04 VITALS — BP 98/55 | HR 75

## 2024-02-04 DIAGNOSIS — Z79899 Other long term (current) drug therapy: Secondary | ICD-10-CM | POA: Diagnosis not present

## 2024-02-04 DIAGNOSIS — D693 Immune thrombocytopenic purpura: Secondary | ICD-10-CM

## 2024-02-04 DIAGNOSIS — D472 Monoclonal gammopathy: Secondary | ICD-10-CM

## 2024-02-04 DIAGNOSIS — D72829 Elevated white blood cell count, unspecified: Secondary | ICD-10-CM

## 2024-02-04 DIAGNOSIS — Z9081 Acquired absence of spleen: Secondary | ICD-10-CM | POA: Diagnosis not present

## 2024-02-04 DIAGNOSIS — E538 Deficiency of other specified B group vitamins: Secondary | ICD-10-CM

## 2024-02-04 DIAGNOSIS — E871 Hypo-osmolality and hyponatremia: Secondary | ICD-10-CM | POA: Diagnosis not present

## 2024-02-04 DIAGNOSIS — E860 Dehydration: Secondary | ICD-10-CM | POA: Insufficient documentation

## 2024-02-04 DIAGNOSIS — Z7984 Long term (current) use of oral hypoglycemic drugs: Secondary | ICD-10-CM | POA: Diagnosis not present

## 2024-02-04 DIAGNOSIS — Z8673 Personal history of transient ischemic attack (TIA), and cerebral infarction without residual deficits: Secondary | ICD-10-CM | POA: Diagnosis not present

## 2024-02-04 DIAGNOSIS — F17210 Nicotine dependence, cigarettes, uncomplicated: Secondary | ICD-10-CM | POA: Diagnosis not present

## 2024-02-04 DIAGNOSIS — F319 Bipolar disorder, unspecified: Secondary | ICD-10-CM | POA: Diagnosis not present

## 2024-02-04 DIAGNOSIS — J0100 Acute maxillary sinusitis, unspecified: Secondary | ICD-10-CM

## 2024-02-04 LAB — CMP (CANCER CENTER ONLY)
ALT: 77 U/L — ABNORMAL HIGH (ref 0–44)
AST: 71 U/L — ABNORMAL HIGH (ref 15–41)
Albumin: 3.8 g/dL (ref 3.5–5.0)
Alkaline Phosphatase: 89 U/L (ref 38–126)
Anion gap: 14 (ref 5–15)
BUN: 29 mg/dL — ABNORMAL HIGH (ref 8–23)
CO2: 20 mmol/L — ABNORMAL LOW (ref 22–32)
Calcium: 10.5 mg/dL — ABNORMAL HIGH (ref 8.9–10.3)
Chloride: 100 mmol/L (ref 98–111)
Creatinine: 1.21 mg/dL — ABNORMAL HIGH (ref 0.44–1.00)
GFR, Estimated: 47 mL/min — ABNORMAL LOW (ref >60)
Glucose, Bld: 116 mg/dL — ABNORMAL HIGH (ref 70–99)
Potassium: 4.9 mmol/L (ref 3.5–5.1)
Sodium: 134 mmol/L — ABNORMAL LOW (ref 135–145)
Total Bilirubin: 0.6 mg/dL (ref 0.0–1.2)
Total Protein: 8.5 g/dL — ABNORMAL HIGH (ref 6.5–8.1)

## 2024-02-04 LAB — CBC WITH DIFFERENTIAL (CANCER CENTER ONLY)
Abs Immature Granulocytes: 0.06 K/uL (ref 0.00–0.07)
Basophils Absolute: 0.1 K/uL (ref 0.0–0.1)
Basophils Relative: 1 %
Eosinophils Absolute: 0.1 K/uL (ref 0.0–0.5)
Eosinophils Relative: 1 %
HCT: 39.4 % (ref 36.0–46.0)
Hemoglobin: 13.2 g/dL (ref 12.0–15.0)
Immature Granulocytes: 1 %
Lymphocytes Relative: 25 %
Lymphs Abs: 2.5 K/uL (ref 0.7–4.0)
MCH: 32.9 pg (ref 26.0–34.0)
MCHC: 33.5 g/dL (ref 30.0–36.0)
MCV: 98.3 fL (ref 80.0–100.0)
Monocytes Absolute: 0.9 K/uL (ref 0.1–1.0)
Monocytes Relative: 9 %
Neutro Abs: 6.4 K/uL (ref 1.7–7.7)
Neutrophils Relative %: 63 %
Platelet Count: 245 K/uL (ref 150–400)
RBC: 4.01 MIL/uL (ref 3.87–5.11)
RDW: 14 % (ref 11.5–15.5)
WBC Count: 10 K/uL (ref 4.0–10.5)
nRBC: 0 % (ref 0.0–0.2)

## 2024-02-04 LAB — PROTIME-INR
INR: 1.1 (ref 0.8–1.2)
Prothrombin Time: 15.2 s (ref 11.4–15.2)

## 2024-02-04 LAB — FOLATE: Folate: 21 ng/mL (ref >5.9)

## 2024-02-04 LAB — VITAMIN B12: Vitamin B-12: 207 pg/mL (ref 180–914)

## 2024-02-04 MED ORDER — SODIUM CHLORIDE 0.9 % IV SOLN
Freq: Once | INTRAVENOUS | Status: AC
Start: 2024-02-04 — End: 2024-02-04

## 2024-02-04 NOTE — Telephone Encounter (Signed)
 Patient has been scheduled for follow-up visit per 02/04/24 LOS.  Pt given an appt calendar with date and time.

## 2024-02-04 NOTE — Progress Notes (Addendum)
 ADDENDUM: Her M spike remains stable at 0.8 and her IgA decreased from 10 95-10 39.  Her IgM remains low.  Her B12 level is even lower at 208 and so I will recommend she try an oral supplement, but she may need injections.   [facility]  [address] 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  She has been in remission since November 2022 after receiving rituximab  for 4 cycles.  We will plan continued follow-up.   Hypercalcemia New mild hypercalcemia.  Her only symptom is fatigue.  Thyroid  and parathyroid  were normal but she was found to have a monoclonal spike. Her calcium  level was 10.4 and went back to normal. Her calcium  today is elevated at 10.5. I will have her hold calcium /vitamin D , vitamin A  and furosemide .    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  I will repeat labs today and order a 24 hour urine for UPEP and total protein. I have explained this to her and will plan to repeat the SPEP and quantitative immunoglobulins every 3 months. Her beta 2 microglobulin is mildly elevated at 2.6 and I will repeat that today. Her total protein decreased from 8.5 to 8.0 today.    Tobacco abuse She started smoking at age 89 and so she has been smoking approximately 1 pack per day for 50+ years and continued to smoke despite numerous discussions about smoking cessation.  She finally quit smoking in February 2023, but she continues to vape daily.  She has never had low-dose CT chest for lung cancer screening.  Will need to discuss at future appointment. She has probably had recently.    Leukocytosis Chronic mild leukocytosis, but her WBC is normal today at 10.0.    Cirrhosis of liver with ascites (HCC) Cirrhosis.  Recent liver ultrasound was stable.  AFP was checked last time and is normal. Her liver function tests are a little worse today.   Peptic ulcer disease She was found to have a gastric ulcer in July of 2023 as well as esophagitis.  Colon polyps These were tubular adenomas in July of 2023 so repeat was recommended in 3 years.  Hypotension She appears dehydrated with an increase BUN of 29 and creatinine of 1.21. She also has hyponatremia with a sodium of 134. She is on Midodrine  and just took a tablet 2 hours ago and her BP is still only 86/53 so I will give her 1L of IV normal saline and have her hold her lasix  over the weekend. She has promised to follow-up with her PCP next week for further instructions.   Low B12 level This is barely in the normal range and I think she would benefit from supplement.  We will see if it comes up with oral supplement but she may require injections.  Plan: Her BP is quite low at 86/53 despite taking Midodrine  2 hours ago, and she is symptomatic. She inquired about having testing for GeneConnect and I will arrange for her to come back and have this drawn. She has a WBC of 10.0, hemoglobin of 13.2,  and platelet count of 245,000. She has a low sodium of 134, elevated creatinine of 1.21 with a BUN of 29, calcium  of 10.5, total protein of 8.5, AST of 71, and ALT of 77. She currently takes Lasix  20 mg. I advised her to hold her Lasix  pill over the weekend and contact her PCP to discuss lowering her dose by either half a pill daily or one pill every other day if tolerated. She will receive 1L IV normal saline today. Her B-12, folate, protime-INR, SPEP, and quantitative immunoglobulins are pending today. I will call her with the results. I will see her back in 3 months with CBC, CMP, SPEP, and quantitative immunoglobulins. The patient  understands the plans discussed today and is in agreement with them.  She knows to contact our office if she develops concerns prior to her next appointment.  I provided 20 minutes of face-to-face time during this this encounter and > 50% was spent counseling as documented under my assessment and plan.   Jean VEAR Cornish, MD  [facility] A DEPT OF [facility] [address] [place] [address] Dept: [phone] Dept Fax: [phone]   No orders of the defined types were placed in this encounter.   CHIEF COMPLAINT:  CC: History of ITP with multiple recurrences, new MGUS IgA lambda in 2024  Current Treatment: Surveillance  HISTORY OF PRESENT ILLNESS:  Jean Kramer is a 73 year old with a history of ITP originally diagnosed in the 1960's.  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 She initially responded to that, but over the years has been on and off corticosteroids for relapsing ITP, and was also treated with danazol for a time.  Her ITP had been in remission for many years. She has also has had chronic leukocytosis.    Annual mammogram in May 2018 revealed an intraductal mass in the right breast at 10 o 'clock measuring 6 mm.  We recommended an ultrasound-guided biopsy which revealed fibroadenomatoid and fibrocystic changes, including cystic and micro papillary apocrine metaplasia, apocrine adenosis, stromal fibrosis, and foci of pseudo angiomatous stromal hyperplasia and focal sclerosis.  It was recommended that she have excision of this area and that was performed in July by Dr. Prentice Conch.  The final pathology revealed atrophic breast tissue with fibrocystic changes and foci of usual ductal hyperplasia but no intraductal papilloma, fibroadenoma, atypia or malignancy.   There was another lumpectomy sample, which revealed micropapillary ductal hyperplasia, focal ectatic ducts, and patchy periductal chronic inflammation.  The other area of lumpectomy had morphologic changes similar to the original biopsy.  We had been seeing her regularly, but then she was lost to follow-up after May 2019. She had a diagnostic right mammogram in April 2020, which revealed evolving fat necrosis.  Annual bilateral screening mammograms in October 2022 and November 2023 did not reveal any evidence of malignancy.   She was admitted to [facility] in July 2022 due to developing an upper GI bleed from taking BC powders.  We had advised her to discontinue these previously.  She switched to Tylenol  for her pain.  While in the hospital, her platelet count dropped into the teens, and she was given steroids and IVIG.   She had extensive bruising. She was in the hospital for a  total of 15 days and her platelet count had gone up to 63,000 at the time of discharge.  She was referred back to us .  She was given a prescription for prednisone  at the time of discharge, but Dr. Cornelius advised her not to take this, as her platelet count was normal. When we saw her in early August her platelet count was up to 168,000.  She had leukocytosis with white count was 14,000.  Her bruises had resolved and she was doing well.   She was then admitted to [facility] in August 2022 due to severe thrombocytopenia with a platelet count less than 5,000.  She presented to the hospital after she removed a scab from her knee, and the wound continued to ooze blood.  She received Nplate  injection x2, as well as 2 units of PRBCs and 1 unit of platelets. Upon discharge, her platelet count had improved to 17,000.  She was advised to discontinue aspirin  81 mg.  She once again had extensive bruising, but also noted shortness of breath with exertion, insomnia, and occasional dizziness and lightheadedness.  We saw her for hospital  follow-up and she had resolution of the leukocytosis and her platelet count was up  to 41,000.  Chemistries revealed worsening elevation of the liver transaminases with an SGOT of 117 and an SGPT of 90.  She states that she has known fatty liver.  As her platelets remained low, she was started on prednisone  60 mg daily. She initially had an excellent response to high-dose prednisone , but when we tapered her rapidly, she had recurrent severe thrombocytopenia.  We were unable to slowly taper the prednisone , so she went on to receive rituximab  weekly for 4 weeks with an excellent response.  She was rapidly tapered off of prednisone  while receiving rituximab .  Her platelets have remained normal since November 2022.     She was diagnosed with liver cirrhosis in 2023 and sees Dr. Leigh in [facility].  EGD in July 2023 revealed LA Grade A esophagitis fountolod focally at the GEJ. A plaque was found in the lower third of the esophagus, 35 cm from the incisors, could not be lavaged off. Biopsies were taken with a cold forceps for histology. The exam of the esophagus was otherwise normal. No varices. Diffuse mild inflammation characterized by erythema, friability and granularity was found in the entire examined stomach. One non-bleeding gastric ulcer with no stigmata of bleeding was found in the prepyloric region of the stomach, with some associated inflammatory nodularity. The lesion was 3 mm in largest dimension. Biopsies were taken from the periphery of the lesion with a cold forceps for histology. The exam of the stomach was otherwise normal. No varices. Biopsies were taken with a cold forceps in the gastric body, at the incisura and in the gastric antrum for Helicobacter pylori testing. The examined duodenum was normal.  She had colonoscopy and repeat EGD in October 2023 with removal of several small colon polyps.  Pathology revealed tubular adenomas.  EGD revealed good healing of the gastric ulcer with no new  findings.  Follow-up colonoscopy and EGD in 3 years was recommended.   INTERVAL HISTORY:  Jean Kramer is here today for repeat clinical assessment for her history of ITP with multiple recurrences and new MGUS IgA lambda in 2024. She has had a splenectomy twice. Patient states that she does not feel the best today and complains of dry mouth, dizziness, and knee pain. Her BP is quite low at 86/53 despite taking Midodrine   2 hours ago. She inquired about having testing for GeneConnect and I will arrange for her to come back and have this drawn. She has a WBC of 10.0, hemoglobin of 13.2, and platelet count of 245,000. She has a low sodium of 134, elevated creatinine of 1.21 with a BUN of 29, calcium  of 10.5, total protein of 8.5, AST of 71, and ALT of 77. She currently takes Lasix  20 mg. I advised her to hold her Lasix  pill over the weekend and contact her PCP to discuss lowering her dose by either half a pill daily or one pill every other day if tolerated. She will receive 1L IV normal saline today. Her B-12, folate, protime-INR, SPEP, and quantitative immunoglobulins are pending today. I will call her with the results. I will see her back in 3 months with CBC, CMP, SPEP, and quantitative immunoglobulins. She denies fever, chills, night sweats, or other signs of infection. She denies cardiorespiratory and gastrointestinal issues. Her appetite is good and Her weight has decreased 3 pounds over last 3 months.    REVIEW OF SYSTEMS:  Review of Systems  Constitutional:  Positive for diaphoresis (occasional night sweats) and fatigue. Negative for appetite change, chills, fever and unexpected weight change.  HENT:  Negative.  Negative for hearing loss, lump/mass, mouth sores, nosebleeds, sore throat, tinnitus, trouble swallowing and voice change.   Eyes:  Positive for eye problems (occasional blurry, floaters). Negative for icterus.  Respiratory:  Positive for shortness of breath (chronic, mild with exertion). Negative for  chest tightness, cough, hemoptysis and wheezing.   Cardiovascular:  Negative for chest pain, leg swelling and palpitations.  Gastrointestinal:  Negative for abdominal distention, abdominal pain, blood in stool, constipation, diarrhea, nausea, rectal pain and vomiting.  Endocrine: Negative.  Negative for hot flashes.  Genitourinary: Negative.  Negative for bladder incontinence, difficulty urinating, dyspareunia, dysuria, frequency, hematuria, menstrual problem, nocturia, pelvic pain, vaginal bleeding and vaginal discharge.   Musculoskeletal:  Positive for arthralgias. Negative for back pain, flank pain, gait problem, myalgias, neck pain and neck stiffness.       Knee pain  Skin: Negative.  Negative for itching, rash and wound.  Neurological:  Positive for dizziness and numbness (Neuropathy bilateral feet). Negative for extremity weakness, gait problem, headaches, light-headedness, seizures and speech difficulty.  Hematological:  Negative for adenopathy. Bruises/bleeds easily.  Psychiatric/Behavioral: Negative.  Negative for confusion, decreased concentration, depression, sleep disturbance and suicidal ideas. The patient is not nervous/anxious.      VITALS:  Blood pressure (!) 86/53, pulse 73, temperature 98.4 F (36.9 C), temperature source Oral, resp. rate 20, height 5' 2 (1.575 m), weight 196 lb 8 oz (89.1 kg), SpO2 97%.  Wt Readings from Last 3 Encounters:  02/04/24 196 lb 8 oz (89.1 kg)  11/11/23 194 lb 8 oz (88.2 kg)  11/03/23 193 lb 6.4 oz (87.7 kg)    Body mass index is 35.94 kg/m.  Performance status (ECOG): 1 - Symptomatic but completely ambulatory  PHYSICAL EXAM:  Physical Exam Vitals and nursing note reviewed.  Constitutional:      General: She is not in acute distress.    Appearance: Normal appearance. She is normal weight. She is not ill-appearing, toxic-appearing or diaphoretic.  HENT:     Head: Normocephalic and atraumatic.     Right Ear: Tympanic membrane, ear canal  and external ear normal. There is no impacted cerumen.     Left Ear: Tympanic membrane, ear canal and external ear normal. There is no impacted cerumen.  Nose: Nose normal. No congestion or rhinorrhea.     Mouth/Throat:     Mouth: Mucous membranes are moist.     Pharynx: Oropharynx is clear. No oropharyngeal exudate or posterior oropharyngeal erythema.  Eyes:     General: No scleral icterus.       Right eye: No discharge.        Left eye: No discharge.     Extraocular Movements: Extraocular movements intact.     Conjunctiva/sclera: Conjunctivae normal.     Pupils: Pupils are equal, round, and reactive to light.  Neck:     Vascular: No carotid bruit.  Cardiovascular:     Rate and Rhythm: Normal rate and regular rhythm.     Pulses: Normal pulses.     Heart sounds: Normal heart sounds. No murmur heard.    No friction rub. No gallop.  Pulmonary:     Effort: Pulmonary effort is normal. No respiratory distress.     Breath sounds: Normal breath sounds. No stridor. No wheezing, rhonchi or rales.  Chest:     Chest wall: No tenderness.  Abdominal:     General: Bowel sounds are normal. There is no distension.     Palpations: Abdomen is soft. There is no hepatomegaly, splenomegaly or mass.     Tenderness: There is no abdominal tenderness. There is no right CVA tenderness, left CVA tenderness, guarding or rebound.     Hernia: No hernia is present.  Musculoskeletal:        General: No swelling, tenderness, deformity or signs of injury. Normal range of motion.     Cervical back: Normal range of motion and neck supple. No rigidity or tenderness.     Right lower leg: No edema.     Left lower leg: No edema.  Lymphadenopathy:     Cervical: No cervical adenopathy.     Right cervical: No superficial, deep or posterior cervical adenopathy.    Left cervical: No superficial, deep or posterior cervical adenopathy.     Upper Body:     Right upper body: No supraclavicular, axillary or pectoral  adenopathy.     Left upper body: No supraclavicular, axillary or pectoral adenopathy.  Skin:    General: Skin is warm and dry.     Coloration: Skin is not jaundiced or pale.     Findings: No bruising, lesion or rash.  Neurological:     General: No focal deficit present.     Mental Status: She is alert and oriented to person, place, and time. Mental status is at baseline.     Cranial Nerves: No cranial nerve deficit.     Sensory: No sensory deficit.     Motor: No weakness.     Coordination: Coordination normal.     Gait: Gait normal.     Deep Tendon Reflexes: Reflexes normal.  Psychiatric:        Mood and Affect: Mood normal.        Behavior: Behavior normal.        Thought Content: Thought content normal.        Judgment: Judgment normal.    LABS:      Latest Ref Rng & Units 02/04/2024    1:59 PM 11/11/2023    3:31 PM 11/03/2023    1:46 PM  CBC  WBC 4.0 - 10.5 K/uL 10.0  12.4  12.3   Hemoglobin 12.0 - 15.0 g/dL 86.7  87.0  86.7   Hematocrit 36.0 - 46.0 % 39.4  38.8  38.8  Platelets 150 - 400 K/uL 245  248.0  233       Latest Ref Rng & Units 02/04/2024    1:59 PM 11/11/2023    3:31 PM 11/03/2023    1:46 PM  CMP  Glucose 70 - 99 mg/dL 883  878  892   BUN 8 - 23 mg/dL 29  22  18    Creatinine 0.44 - 1.00 mg/dL 8.78  9.24  9.19   Sodium 135 - 145 mmol/L 134  135  138   Potassium 3.5 - 5.1 mmol/L 4.9  4.5  4.1   Chloride 98 - 111 mmol/L 100  103  103   CO2 22 - 32 mmol/L 20  24  24    Calcium  8.9 - 10.3 mg/dL 89.4  9.8  9.7   Total Protein 6.5 - 8.1 g/dL 8.5  8.0  8.0   Total Bilirubin 0.0 - 1.2 mg/dL 0.6  0.5  0.4   Alkaline Phos 38 - 126 U/L 89  98  117   AST 15 - 41 U/L 71  64  69   ALT 0 - 44 U/L 77  57  64    Lab Results  Component Value Date   TOTALPROTELP 7.8 02/04/2024   ALBUMINELP 3.5 02/04/2024   A1GS 0.2 02/04/2024   A2GS 0.8 02/04/2024   BETS 2.2 (H) 02/04/2024   GAMS 1.1 02/04/2024   MSPIKE 0.8 (H) 02/04/2024   SPEI Comment 02/04/2024   Lab Results   Component Value Date   TIBC 345.8 05/08/2022   TIBC 330 02/08/2021   FERRITIN 271.8 05/08/2022   FERRITIN 137 02/08/2021   IRONPCTSAT 26.9 05/08/2022   IRONPCTSAT 11 02/08/2021   Lab Results  Component Value Date   TSH 1.477 06/21/2023    STUDIES:  No results found.    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 Her smoking use included cigarettes. She has never used smokeless tobacco. She reports that she does not drink alcohol and does not use drugs.The patient is alone today.  Allergies: No Known Allergies  Current Medications: Current Outpatient Medications  Medication Sig Dispense Refill   nystatin ointment (MYCOSTATIN) Apply 1 Application topically 2 (two) times daily.     albuterol  (VENTOLIN  HFA) 108 (90 Base) MCG/ACT inhaler Inhale 2 puffs into the lungs every 4 (four) hours as needed.     alendronate (FOSAMAX) 70 MG tablet Take 70 mg by  mouth every Friday.     Biotin w/ Vitamins C & E (HAIR/SKIN/NAILS PO) Take 1 capsule by mouth daily.     Budeson-Glycopyrrol-Formoterol  (BREZTRI AEROSPHERE)  160-9-4.8 MCG/ACT AERO 2 puffs     busPIRone  (BUSPAR ) 15 MG tablet Take 15 mg by mouth 2 (two) times daily.     docusate sodium  (COLACE) 100 MG capsule Take 100 mg by mouth daily. 3 tabs at bedtime     Ferrous Sulfate (IRON PO) Take by mouth. 9 mg two times daily (Patient not taking: Reported on 02/04/2024)     furosemide  (LASIX ) 20 MG tablet Take 20 mg by mouth daily.     hydrOXYzine  (ATARAX ) 25 MG tablet Take 1 tablet by mouth 2 (two) times daily as needed for anxiety.     ipratropium-albuterol  (DUONEB) 0.5-2.5 (3) MG/3ML SOLN Inhale 3 mLs into the lungs every 6 (six) hours as needed for shortness of breath.     levothyroxine  (SYNTHROID ) 50 MCG tablet Mylan levothyroxine      linaclotide  (LINZESS ) 290 MCG CAPS capsule Take 1 capsule (290 mcg total) by mouth daily before breakfast. Lot: [id number], exp: 09-2023 (Patient not taking: Reported on 02/04/2024)     lubiprostone (AMITIZA) 24 MCG capsule Take 24 mcg by mouth 2 (two) times daily with a meal.     metFORMIN (GLUCOPHAGE-XR) 500 MG 24 hr tablet Take 500 mg by mouth daily.     midodrine  (PROAMATINE ) 10 MG tablet Take 1 tablet (10 mg total) by mouth 3 (three) times daily with meals. 90 tablet 3   MOUNJARO 2.5 MG/0.5ML Pen Inject 2.5 mg into the skin once a week. Takes it on Sat     omeprazole  (PRILOSEC) 40 MG capsule TAKE 1 CAPSULE BY MOUTH DAILY 90 capsule 3   ondansetron  (ZOFRAN ) 4 MG tablet Take 1 tablet (4 mg total) by mouth every 4 (four) hours as needed for nausea. 90 tablet 3   Oxycodone  HCl 20 MG TABS 1 tablet as needed     Plecanatide  (TRULANCE ) 3 MG TABS Take 3 mg by mouth daily. Exp: 08-2023, LOT: [id number] 9 tablet 0   pramipexole  (MIRAPEX ) 0.5 MG tablet Take 0.5 mg by mouth at bedtime.     QUEtiapine  (SEROQUEL ) 200 MG tablet Take 200 mg by mouth at bedtime.     No current facility-administered medications for this visit.    I,Jean Kramer,acting as a scribe for Jean VEAR Cornish, MD.,have documented all relevant documentation on  the behalf of Jean VEAR Cornish, MD,as directed by  Jean VEAR Cornish, MD while in the presence of Jean VEAR Cornish, MD.

## 2024-02-05 LAB — IGG, IGA, IGM
IgA: 1039 mg/dL — ABNORMAL HIGH (ref 64–422)
IgG (Immunoglobin G), Serum: 1417 mg/dL (ref 586–1602)
IgM (Immunoglobulin M), Srm: 15 mg/dL — ABNORMAL LOW (ref 26–217)

## 2024-02-07 LAB — PROTEIN ELECTROPHORESIS, SERUM
A/G Ratio: 0.8 (ref 0.7–1.7)
Albumin ELP: 3.5 g/dL (ref 2.9–4.4)
Alpha-1-Globulin: 0.2 g/dL (ref 0.0–0.4)
Alpha-2-Globulin: 0.8 g/dL (ref 0.4–1.0)
Beta Globulin: 2.2 g/dL — ABNORMAL HIGH (ref 0.7–1.3)
Gamma Globulin: 1.1 g/dL (ref 0.4–1.8)
Globulin, Total: 4.3 g/dL — ABNORMAL HIGH (ref 2.2–3.9)
M-Spike, %: 0.8 g/dL — ABNORMAL HIGH
Total Protein ELP: 7.8 g/dL (ref 6.0–8.5)

## 2024-02-17 ENCOUNTER — Other Ambulatory Visit: Payer: Self-pay | Admitting: Oncology

## 2024-02-17 ENCOUNTER — Encounter: Payer: Self-pay | Admitting: Oncology

## 2024-02-17 DIAGNOSIS — E538 Deficiency of other specified B group vitamins: Secondary | ICD-10-CM

## 2024-02-24 ENCOUNTER — Telehealth: Payer: Self-pay

## 2024-02-24 NOTE — Telephone Encounter (Signed)
-----   Message from Jean Kramer sent at 02/17/2024  5:55 PM EDT ----- Regarding: call Tell her the abnormal protein is stable to improved but her B12 level is even lower, just barely normal.  I recommend she start on 1 pill daily of either 500 mcg or 1000 mcg and we will recheck it when she returns.

## 2024-02-24 NOTE — Telephone Encounter (Signed)
 Attempted to contact patient. No answer.

## 2024-03-28 ENCOUNTER — Other Ambulatory Visit: Payer: Self-pay | Admitting: Gastroenterology

## 2024-03-28 DIAGNOSIS — K2970 Gastritis, unspecified, without bleeding: Secondary | ICD-10-CM

## 2024-05-05 ENCOUNTER — Telehealth: Payer: Self-pay | Admitting: Oncology

## 2024-05-05 ENCOUNTER — Other Ambulatory Visit: Payer: Self-pay | Admitting: Oncology

## 2024-05-05 ENCOUNTER — Other Ambulatory Visit

## 2024-05-05 ENCOUNTER — Encounter: Payer: Self-pay | Admitting: Oncology

## 2024-05-05 ENCOUNTER — Inpatient Hospital Stay: Attending: Hematology and Oncology

## 2024-05-05 ENCOUNTER — Inpatient Hospital Stay: Admitting: Oncology

## 2024-05-05 ENCOUNTER — Ambulatory Visit: Admitting: Oncology

## 2024-05-05 VITALS — BP 106/75 | HR 80 | Temp 98.2°F | Resp 18 | Ht 62.0 in | Wt 197.5 lb

## 2024-05-05 DIAGNOSIS — Z79899 Other long term (current) drug therapy: Secondary | ICD-10-CM | POA: Insufficient documentation

## 2024-05-05 DIAGNOSIS — R188 Other ascites: Secondary | ICD-10-CM | POA: Diagnosis not present

## 2024-05-05 DIAGNOSIS — D472 Monoclonal gammopathy: Secondary | ICD-10-CM

## 2024-05-05 DIAGNOSIS — E538 Deficiency of other specified B group vitamins: Secondary | ICD-10-CM

## 2024-05-05 DIAGNOSIS — F17210 Nicotine dependence, cigarettes, uncomplicated: Secondary | ICD-10-CM | POA: Diagnosis not present

## 2024-05-05 DIAGNOSIS — K7460 Unspecified cirrhosis of liver: Secondary | ICD-10-CM | POA: Diagnosis not present

## 2024-05-05 DIAGNOSIS — Z9081 Acquired absence of spleen: Secondary | ICD-10-CM

## 2024-05-05 DIAGNOSIS — D693 Immune thrombocytopenic purpura: Secondary | ICD-10-CM

## 2024-05-05 DIAGNOSIS — D72829 Elevated white blood cell count, unspecified: Secondary | ICD-10-CM | POA: Insufficient documentation

## 2024-05-05 DIAGNOSIS — Z1231 Encounter for screening mammogram for malignant neoplasm of breast: Secondary | ICD-10-CM

## 2024-05-05 DIAGNOSIS — J0100 Acute maxillary sinusitis, unspecified: Secondary | ICD-10-CM

## 2024-05-05 DIAGNOSIS — Z780 Asymptomatic menopausal state: Secondary | ICD-10-CM

## 2024-05-05 LAB — VITAMIN B12: Vitamin B-12: 223 pg/mL (ref 180–914)

## 2024-05-05 LAB — CBC WITH DIFFERENTIAL (CANCER CENTER ONLY)
Abs Immature Granulocytes: 0.06 K/uL (ref 0.00–0.07)
Basophils Absolute: 0.1 K/uL (ref 0.0–0.1)
Basophils Relative: 0 %
Eosinophils Absolute: 0.2 K/uL (ref 0.0–0.5)
Eosinophils Relative: 1 %
HCT: 36.7 % (ref 36.0–46.0)
Hemoglobin: 12.6 g/dL (ref 12.0–15.0)
Immature Granulocytes: 1 %
Lymphocytes Relative: 31 %
Lymphs Abs: 3.5 K/uL (ref 0.7–4.0)
MCH: 33.2 pg (ref 26.0–34.0)
MCHC: 34.3 g/dL (ref 30.0–36.0)
MCV: 96.8 fL (ref 80.0–100.0)
Monocytes Absolute: 1.1 K/uL — ABNORMAL HIGH (ref 0.1–1.0)
Monocytes Relative: 9 %
Neutro Abs: 6.6 K/uL (ref 1.7–7.7)
Neutrophils Relative %: 58 %
Platelet Count: 232 K/uL (ref 150–400)
RBC: 3.79 MIL/uL — ABNORMAL LOW (ref 3.87–5.11)
RDW: 14.1 % (ref 11.5–15.5)
WBC Count: 11.5 K/uL — ABNORMAL HIGH (ref 4.0–10.5)
nRBC: 0 % (ref 0.0–0.2)

## 2024-05-05 LAB — CMP (CANCER CENTER ONLY)
ALT: 41 U/L (ref 0–44)
AST: 41 U/L (ref 15–41)
Albumin: 3.7 g/dL (ref 3.5–5.0)
Alkaline Phosphatase: 99 U/L (ref 38–126)
Anion gap: 10 (ref 5–15)
BUN: 15 mg/dL (ref 8–23)
CO2: 24 mmol/L (ref 22–32)
Calcium: 11.1 mg/dL — ABNORMAL HIGH (ref 8.9–10.3)
Chloride: 105 mmol/L (ref 98–111)
Creatinine: 0.86 mg/dL (ref 0.44–1.00)
GFR, Estimated: >60 mL/min (ref >60)
Glucose, Bld: 77 mg/dL (ref 70–99)
Potassium: 4 mmol/L (ref 3.5–5.1)
Sodium: 139 mmol/L (ref 135–145)
Total Bilirubin: 0.5 mg/dL (ref 0.0–1.2)
Total Protein: 7.8 g/dL (ref 6.5–8.1)

## 2024-05-05 NOTE — Progress Notes (Signed)
 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  Jean Kramer has been in remission since November 2022 after receiving rituximab  for 4 cycles.  We will plan continued follow-up.   Hypercalcemia New mild hypercalcemia.  Her only symptom is fatigue.  Thyroid  and parathyroid  were normal but Jean Kramer was found to have a monoclonal spike. Her calcium  level was 10.4 and went back to normal. Her calcium  today is elevated at 11.1. I will have her hold calcium /vitamin D , vitamin A  and furosemide .    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 Her IgA level was 1172, with elevated IgG of 1682 and decreased IgM of 22 Serum light chains were both elevated, with lambda at 116 but normal ratio of 0.51.  Her beta 2 microglobulin is mildly elevated at 2.6. Her monoclonal spike has remained stable at 0.8 for the last two readings.    Tobacco abuse Jean Kramer started smoking at age 68 and so Jean Kramer has been smoking approximately 1 pack per day for 50+ years and continued to smoke despite numerous discussions about smoking cessation.  Jean Kramer finally quit smoking in February 2023 and Jean Kramer finally has stopped vaping. Jean Kramer will need low dose CT chest for lung cancer screening.     Leukocytosis Chronic mild leukocytosis, but her WBC is normal today at 10.0.    Cirrhosis of liver with ascites (HCC) Cirrhosis.  Recent liver ultrasound was stable.  AFP was checked last time and is normal. Her liver function tests are a little worse today.   Peptic ulcer disease Jean Kramer was found to have a gastric ulcer in July of 2023 as well as esophagitis.  Colon polyps These were tubular adenomas in July of 2023 so repeat was recommended in 3 years.  Low B12 level This is barely in  the normal range and I think Jean Kramer would benefit from supplement. I repeated a level today and will decide.  Severe osteoarthritis Jean Kramer was scheduled for hip replacement next week but this was canceled due to insurance issues. Now Jean Kramer can barely ambulate or get up and down. Jean Kramer needs a raised toilet seat to be able to use the toilet.  Jean Kramer will have to see another orthopedic surgeon in order to get her hip surgery scheduled in [place].   Plan: Her Monoclonal-Spike has been stable at 0.8. 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 Patient complains of pain, especially in her left hip. This pain makes using a low toilet difficult. I will write her a prescription for a raised toilet seat. Jean Kramer was scheduled to get left hip replacement, but was unable to due to insurance complications.  Jean Kramer will have to see another orthopedic surgeon in order to get her hip surgery scheduled in [place].  Jean Kramer has an elevated WBC of 11.5 with an ANC of 6600 up from 10.0 with an ANC of 6400, hemoglobin of 12.6, and platelet count of 232,000. Her CMP is normal other than an elevated calcium  of 11.1, up from 10.5. Her vitamin B12, SPEP, and quantitative immunoglobulins are pending. Jean Kramer is not currently taking calcium  supplements so I told her that Jean Kramer does not need to restart them at this time. Jean Kramer informed me that Jean Kramer is taking Ozempic. I advised her to  not skip meals so that Jean Kramer does not become hypoglycemic. I will see her back in 3 months with CBC, CMP, SPEP, quantitative immunoglobulins, and bilateral screening mammogram. The patient understands the plans discussed today and is in agreement with them.  Jean Kramer knows to contact our office if Jean Kramer develops concerns prior to her next appointment.  I provided 25 minutes of face-to-face time during this this encounter and > 50% was spent counseling as documented under my assessment and plan.   Jean VEAR Cornish, MD  [facility] - A DEPT OF [facility] [address] [place] [address] Dept: [phone] Dept Fax: [phone]   No orders of the defined types were placed in this encounter.   CHIEF COMPLAINT:  CC: History of ITP with multiple recurrences, new MGUS IgA lambda in 2024  Current Treatment: Surveillance  HISTORY OF PRESENT ILLNESS:  Jean Kramer is a 73 year old with a history of ITP originally diagnosed in the 1960's.  We began seeing her in April 1999, when Jean Kramer had a relapse despite having had a splenectomy.  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 Jean Kramer initially responded to that, but over the years has been on and off corticosteroids for relapsing ITP, and was also treated with danazol for a time.  Her ITP had been in remission for many years. Jean Kramer has also has had chronic leukocytosis.    Annual mammogram in May 2018 revealed an intraductal mass in the right breast at 10 o 'clock measuring 6 mm.  We recommended an ultrasound-guided biopsy which revealed fibroadenomatoid and fibrocystic changes, including cystic and micro papillary apocrine metaplasia, apocrine adenosis, stromal fibrosis, and foci of pseudo angiomatous stromal hyperplasia and focal sclerosis.  It was recommended that Jean Kramer have excision of this area and that was performed in July by Dr. Prentice Kramer.  The final pathology revealed atrophic breast tissue with fibrocystic changes and foci of usual ductal hyperplasia but no intraductal papilloma, fibroadenoma, atypia or malignancy.  There was another lumpectomy sample, which revealed micropapillary ductal hyperplasia, focal ectatic ducts, and patchy periductal chronic inflammation.  The other area of lumpectomy had morphologic changes similar to the original biopsy.  We had been seeing her regularly, but then Jean Kramer was lost to follow-up after May 2019. Jean Kramer had a diagnostic right mammogram in April 2020, which revealed evolving fat necrosis.   Annual bilateral screening mammograms in October 2022 and November 2023 did not reveal any evidence of malignancy.   Jean Kramer was admitted to [facility] in July 2022 due to developing an upper GI bleed from taking BC powders.  We had advised her to discontinue these previously.  Jean Kramer switched to Tylenol  for her pain.  While in the hospital, her platelet count dropped into the teens, and Jean Kramer was given steroids and IVIG.   Jean Kramer had extensive bruising. Jean Kramer was in the hospital for a total of 15 days and her platelet count had gone up to 63,000 at the time of discharge.  Jean Kramer was referred back to us .  Jean Kramer was given a prescription for prednisone  at the time of discharge, but Dr. Cornish advised her not to take this, as her platelet count was normal. When we saw her in early August her platelet count was up to 168,000.  Jean Kramer had leukocytosis with white count was 14,000.  Her bruises had resolved and Jean Kramer was doing well.  Jean Kramer was then admitted to [facility] in August 2022 due to severe thrombocytopenia with a platelet count less than 5,000.  Jean Kramer presented to the hospital after Jean Kramer removed a scab from her knee, and the wound continued to ooze blood.  Jean Kramer received Nplate  injection x2, as well as 2 units of PRBCs and 1 unit of platelets. Upon discharge, her platelet count had improved to 17,000.  Jean Kramer was advised to discontinue aspirin  81 mg.  Jean Kramer once again had extensive bruising, but also noted shortness of breath with exertion, insomnia, and occasional dizziness and lightheadedness.  We saw her for hospital follow-up and Jean Kramer had resolution of the leukocytosis and her platelet count was up  to 41,000.  Chemistries revealed worsening elevation of the liver transaminases with an SGOT of 117 and an SGPT of 90.  Jean Kramer states that Jean Kramer has known fatty liver.  As her platelets remained low, Jean Kramer was started on prednisone  60 mg daily. Jean Kramer initially had an excellent response to high-dose prednisone , but when we tapered her rapidly,  Jean Kramer had recurrent severe thrombocytopenia.  We were unable to slowly taper the prednisone , so Jean Kramer went on to receive rituximab  weekly for 4 weeks with an excellent response.  Jean Kramer was rapidly tapered off of prednisone  while receiving rituximab .  Her platelets have remained normal since November 2022.     Jean Kramer was diagnosed with liver cirrhosis in 2023 and sees Dr. Leigh in [facility].  EGD in July 2023 revealed LA Grade A esophagitis fountolod focally at the GEJ. A plaque was found in the lower third of the esophagus, 35 cm from the incisors, could not be lavaged off. Biopsies were taken with a cold forceps for histology. The exam of the esophagus was otherwise normal. No varices. Diffuse mild inflammation characterized by erythema, friability and granularity was found in the entire examined stomach. One non-bleeding gastric ulcer with no stigmata of bleeding was found in the prepyloric region of the stomach, with some associated inflammatory nodularity. The lesion was 3 mm in largest dimension. Biopsies were taken from the periphery of the lesion with a cold forceps for histology. The exam of the stomach was otherwise normal. No varices. Biopsies were taken with a cold forceps in the gastric body, at the incisura and in the gastric antrum for Helicobacter pylori testing. The examined duodenum was normal.  Jean Kramer had colonoscopy and repeat EGD in October 2023 with removal of several small colon polyps.  Pathology revealed tubular adenomas.  EGD revealed good healing of the gastric ulcer with no new findings.  Follow-up colonoscopy and EGD in 3 years was recommended.   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 Her Monoclonal-Spike has been stable at 0.8. 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 Patient complains of pain, especially in her left hip. This pain makes using a low toilet difficult.  Jean Kramer will have  to see another orthopedic surgeon in order to get her hip surgery scheduled in [place]. I will write her a prescription for a raised toilet seat as Jean Kramer can barely ambulate or get up and down.SABRA Jean Kramer was scheduled to get left hip replacement, but was unable to due to insurance complications. Jean Kramer has an elevated WBC of 11.5 with an ANC of 6600 up from 10.0 with an ANC of 6400, hemoglobin of 12.6, and platelet count of 232,000. Her CMP is normal other than an elevated calcium   of 11.1, up from 10.5. Her vitamin B12, SPEP, and quantitative immunoglobulins are pending. Jean Kramer is not currently taking calcium  supplements so I told her that Jean Kramer does not need to restart them at this time. Jean Kramer informed me that Jean Kramer is taking Ozempic. I advised her to not skip meals often so that Jean Kramer does not become hypoglycemic. I will see her back in 3 months with CBC, CMP, SPEP, quantitative immunoglobulins, and bilateral screening mammogram.  Jean Kramer denies fever, chills, night sweats, or other signs of infection. Jean Kramer denies cardiorespiratory and gastrointestinal issues. Jean Kramer  denies pain. Her appetite is good and Her weight has decreased 1 pound in the last 3 months.   REVIEW OF SYSTEMS:  Review of Systems  Constitutional:  Positive for diaphoresis (occasional night sweats) and fatigue. Negative for appetite change, chills, fever and unexpected weight change.  HENT:  Negative.  Negative for hearing loss, lump/mass, mouth sores, nosebleeds, sore throat, tinnitus, trouble swallowing and voice change.   Eyes:  Positive for eye problems (occasional blurry, floaters). Negative for icterus.  Respiratory:  Positive for shortness of breath (chronic, mild with exertion). Negative for chest tightness, cough, hemoptysis and wheezing.   Cardiovascular:  Negative for chest pain, leg swelling and palpitations.  Gastrointestinal:  Negative for abdominal distention, abdominal pain, blood in stool, constipation, diarrhea, nausea, rectal pain and vomiting.   Endocrine: Negative.  Negative for hot flashes.  Genitourinary: Negative.  Negative for bladder incontinence, difficulty urinating, dyspareunia, dysuria, frequency, hematuria, menstrual problem, nocturia, pelvic pain, vaginal bleeding and vaginal discharge.   Musculoskeletal:  Positive for arthralgias (severe, chronic, especially left hip). Negative for back pain, flank pain, gait problem, myalgias, neck pain and neck stiffness.       Knee pain  Skin: Negative.  Negative for itching, rash and wound.  Neurological:  Positive for dizziness and numbness (Neuropathy bilateral feet). Negative for extremity weakness, gait problem, headaches, light-headedness, seizures and speech difficulty.  Hematological:  Negative for adenopathy. Bruises/bleeds easily.  Psychiatric/Behavioral: Negative.  Negative for confusion, decreased concentration, depression, sleep disturbance and suicidal ideas. The patient is not nervous/anxious.      VITALS:  Blood pressure 106/75, pulse 80, temperature 98.2 F (36.8 C), temperature source Oral, resp. rate 18, height 5' 2 (1.575 m), weight 197 lb 8 oz (89.6 kg), SpO2 97%.  Wt Readings from Last 3 Encounters:  05/05/24 197 lb 8 oz (89.6 kg)  02/04/24 196 lb 8 oz (89.1 kg)  11/11/23 194 lb 8 oz (88.2 kg)    Body mass index is 36.12 kg/m.  Performance status (ECOG): 1 - Symptomatic but completely ambulatory  PHYSICAL EXAM:  Physical Exam Vitals and nursing note reviewed.  Constitutional:      General: Jean Kramer is not in acute distress.    Appearance: Normal appearance. Jean Kramer is normal weight. Jean Kramer is not ill-appearing, toxic-appearing or diaphoretic.  HENT:     Head: Normocephalic and atraumatic.     Right Ear: Tympanic membrane, ear canal and external ear normal. There is no impacted cerumen.     Left Ear: Tympanic membrane, ear canal and external ear normal. There is no impacted cerumen.     Nose: Nose normal. No congestion or rhinorrhea.     Mouth/Throat:     Mouth:  Mucous membranes are moist.     Pharynx: Oropharynx is clear. No oropharyngeal exudate or posterior oropharyngeal erythema.  Eyes:     General: No scleral icterus.       Right eye: No discharge.  Left eye: No discharge.     Extraocular Movements: Extraocular movements intact.     Conjunctiva/sclera: Conjunctivae normal.     Pupils: Pupils are equal, round, and reactive to light.  Neck:     Vascular: No carotid bruit.  Cardiovascular:     Rate and Rhythm: Normal rate and regular rhythm.     Pulses: Normal pulses.     Heart sounds: Normal heart sounds. No murmur heard.    No friction rub. No gallop.  Pulmonary:     Effort: Pulmonary effort is normal. No respiratory distress.     Breath sounds: Normal breath sounds. No stridor. No wheezing, rhonchi or rales.  Chest:     Chest wall: No tenderness.  Abdominal:     General: Bowel sounds are normal. There is no distension.     Palpations: Abdomen is soft. There is no hepatomegaly, splenomegaly or mass.     Tenderness: There is no abdominal tenderness. There is no right CVA tenderness, left CVA tenderness, guarding or rebound.     Hernia: No hernia is present.  Musculoskeletal:        General: No swelling, tenderness, deformity or signs of injury. Normal range of motion.     Cervical back: Normal range of motion and neck supple. No rigidity or tenderness.     Right lower leg: No edema.     Left lower leg: No edema.  Lymphadenopathy:     Cervical: No cervical adenopathy.     Right cervical: No superficial, deep or posterior cervical adenopathy.    Left cervical: No superficial, deep or posterior cervical adenopathy.     Upper Body:     Right upper body: No supraclavicular, axillary or pectoral adenopathy.     Left upper body: No supraclavicular, axillary or pectoral adenopathy.  Skin:    General: Skin is warm and dry.     Coloration: Skin is not jaundiced or pale.     Findings: No bruising, lesion or rash.  Neurological:      General: No focal deficit present.     Mental Status: Jean Kramer is alert and oriented to person, place, and time. Mental status is at baseline.     Cranial Nerves: No cranial nerve deficit.     Sensory: No sensory deficit.     Motor: No weakness.     Coordination: Coordination normal.     Gait: Gait normal.     Deep Tendon Reflexes: Reflexes normal.  Psychiatric:        Mood and Affect: Mood normal.        Behavior: Behavior normal.        Thought Content: Thought content normal.        Judgment: Judgment normal.    LABS:      Latest Ref Rng & Units 05/05/2024    2:28 PM 02/04/2024    1:59 PM 11/11/2023    3:31 PM  CBC  WBC 4.0 - 10.5 K/uL 11.5  10.0  12.4   Hemoglobin 12.0 - 15.0 g/dL 87.3  86.7  87.0   Hematocrit 36.0 - 46.0 % 36.7  39.4  38.8   Platelets 150 - 400 K/uL 232  245  248.0       Latest Ref Rng & Units 05/05/2024    2:28 PM 02/04/2024    1:59 PM 11/11/2023    3:31 PM  CMP  Glucose 70 - 99 mg/dL 77  883  878   BUN 8 - 23 mg/dL 15  29  22   Creatinine 0.44 - 1.00 mg/dL 9.13  8.78  9.24   Sodium 135 - 145 mmol/L 139  134  135   Potassium 3.5 - 5.1 mmol/L 4.0  4.9  4.5   Chloride 98 - 111 mmol/L 105  100  103   CO2 22 - 32 mmol/L 24  20  24    Calcium  8.9 - 10.3 mg/dL 88.8  89.4  9.8   Total Protein 6.5 - 8.1 g/dL 7.8  8.5  8.0   Total Bilirubin 0.0 - 1.2 mg/dL 0.5  0.6  0.5   Alkaline Phos 38 - 126 U/L 99  89  98   AST 15 - 41 U/L 41  71  64   ALT 0 - 44 U/L 41  77  57    Lab Results  Component Value Date   TOTALPROTELP 7.2 05/05/2024   ALBUMINELP 3.1 05/05/2024   A1GS 0.3 05/05/2024   A2GS 0.8 05/05/2024   BETS 1.8 (H) 05/05/2024   GAMS 1.1 05/05/2024   MSPIKE 0.7 (H) 05/05/2024   SPEI Comment 05/05/2024   Lab Results  Component Value Date   TIBC 345.8 05/08/2022   TIBC 330 02/08/2021   FERRITIN 271.8 05/08/2022   FERRITIN 137 02/08/2021   IRONPCTSAT 26.9 05/08/2022   IRONPCTSAT 11 02/08/2021   Lab Results  Component Value Date   TSH 1.477  06/21/2023    STUDIES:  EXAM: 11/17/2023 ULTRASOUND ABDOMEN LIMITED RIGHT UPPER QUADRANT  IMPRESSION: Cirrhotic liver. No focal liver lesion identified.  EXAM: 08/20/2023 CT CHEST W/O CM IMPRESSION: Redemonstration of bilateral airspace disease involving all lobes, most consistent with pneumonia, without significant interval change in appearance.  EXAM: 06/21/2023 MAM DIGITAL TOMO SCREENING B IMPRESSION: There is stable fat necrosis, trabecular thickening and skin retraction in the right breast, consistent with history of biopsy. The left breast is stable.  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 Her smoking use included cigarettes. Jean Kramer has never used smokeless tobacco. Jean Kramer reports that Jean Kramer does not drink alcohol and does not use drugs.The patient is alone today.  Allergies: No Known Allergies  Current Medications: Current Outpatient Medications  Medication Sig Dispense Refill   albuterol  (VENTOLIN  HFA) 108 (90 Base) MCG/ACT inhaler Inhale 2 puffs into the lungs every 4 (four) hours as needed.      alendronate (FOSAMAX) 70 MG tablet Take 70 mg by mouth every Friday.  Biotin w/ Vitamins C & E (HAIR/SKIN/NAILS PO) Take 1 capsule by mouth daily.     Budeson-Glycopyrrol-Formoterol  (BREZTRI AEROSPHERE) 160-9-4.8 MCG/ACT AERO 2 puffs     busPIRone  (BUSPAR ) 15 MG tablet Take 15 mg by mouth 2 (two) times daily.     calcium -vitamin D  (OSCAL WITH D) 500-5 MG-MCG tablet Take 1 tablet by mouth.     clotrimazole (MYCELEX) 10 MG troche Take 10 mg by mouth 5 (five) times daily.     docusate sodium  (COLACE) 100 MG capsule Take 100 mg by mouth daily. 3 tabs at bedtime     furosemide  (LASIX ) 20 MG tablet Take 20 mg by mouth daily.     hydrOXYzine  (ATARAX ) 25 MG tablet Take 1 tablet by mouth 2 (two) times daily as needed for anxiety.     ipratropium-albuterol  (DUONEB) 0.5-2.5 (3) MG/3ML SOLN Inhale 3 mLs into the lungs every 6 (six) hours as needed for shortness of breath.     levothyroxine  (SYNTHROID ) 50 MCG tablet Mylan levothyroxine      lubiprostone (AMITIZA) 24 MCG capsule Take 24 mcg by mouth 2 (two) times daily with a meal.     metFORMIN (GLUCOPHAGE-XR) 500 MG 24 hr tablet Take 500 mg by mouth daily.     midodrine  (PROAMATINE ) 10 MG tablet Take 1 tablet (10 mg total) by mouth 3 (three) times daily with meals. 90 tablet 3   omeprazole  (PRILOSEC) 40 MG capsule TAKE 1 CAPSULE BY MOUTH DAILY 90 capsule 1   ondansetron  (ZOFRAN ) 4 MG tablet Take 1 tablet (4 mg total) by mouth every 4 (four) hours as needed for nausea. 90 tablet 3   Oxycodone  HCl 20 MG TABS 1 tablet as needed     pramipexole  (MIRAPEX ) 0.5 MG tablet Take 0.5 mg by mouth at bedtime.     QUEtiapine  (SEROQUEL ) 200 MG tablet Take 200 mg by mouth at bedtime.     Semaglutide (OZEMPIC, 0.25 OR 0.5 MG/DOSE, [place]) Inject 0.5 mg into the skin once a week. Takes on Sunday     Ferrous Sulfate (IRON PO) Take by mouth. 9 mg two times daily (Patient not taking: Reported on 02/04/2024)     linaclotide  (LINZESS ) 290 MCG CAPS capsule Take 1 capsule (290 mcg  total) by mouth daily before breakfast. Lot: [id number], exp: 09-2023 (Patient not taking: Reported on 02/04/2024)     MOUNJARO 2.5 MG/0.5ML Pen Inject 2.5 mg into the skin once a week. Takes it on Sat     nystatin ointment (MYCOSTATIN) Apply 1 Application topically 2 (two) times daily.     Plecanatide  (TRULANCE ) 3 MG TABS Take 3 mg by mouth daily. Exp: 08-2023, LOT: [id number] 9 tablet 0   No current facility-administered medications for this visit.    I,Jeniffer Culliver H Aden Youngman,acting as a scribe for Jean VEAR Cornish, MD.,have documented all relevant documentation on the behalf of Jean VEAR Cornish, MD,as directed by  Jean VEAR Cornish, MD while in the presence of Jean VEAR Cornish, MD.  I have reviewed this report as typed by the medical scribe, and it is complete and accurate.

## 2024-05-05 NOTE — Telephone Encounter (Signed)
 Patient has been scheduled for follow-up visit per 05/05/24 LOS.  Pt given an appt calendar with date and time.

## 2024-05-06 LAB — IGG, IGA, IGM
IgA: 991 mg/dL — ABNORMAL HIGH (ref 64–422)
IgG (Immunoglobin G), Serum: 1344 mg/dL (ref 586–1602)
IgM (Immunoglobulin M), Srm: 25 mg/dL — ABNORMAL LOW (ref 26–217)

## 2024-05-08 ENCOUNTER — Telehealth: Payer: Self-pay | Admitting: *Deleted

## 2024-05-08 DIAGNOSIS — K7460 Unspecified cirrhosis of liver: Secondary | ICD-10-CM

## 2024-05-08 NOTE — Telephone Encounter (Signed)
-----   Message from Nurse Naomie RAMAN sent at 11/17/2023 10:31 AM EDT ----- Needs repeat u/s around 05/18/24 for [facility] screen cirrhosis;[place] pt

## 2024-05-08 NOTE — Telephone Encounter (Signed)
 Patient has been scheduled for an abdominal ultrasound at [facility] on 05/19/24 at 830 am, 8 am arrival. NPO midnight.  I have left a message for patient to call back.

## 2024-05-09 LAB — PROTEIN ELECTROPHORESIS, SERUM
A/G Ratio: 0.8 (ref 0.7–1.7)
Albumin ELP: 3.1 g/dL (ref 2.9–4.4)
Alpha-1-Globulin: 0.3 g/dL (ref 0.0–0.4)
Alpha-2-Globulin: 0.8 g/dL (ref 0.4–1.0)
Beta Globulin: 1.8 g/dL — ABNORMAL HIGH (ref 0.7–1.3)
Gamma Globulin: 1.1 g/dL (ref 0.4–1.8)
Globulin, Total: 4.1 g/dL — ABNORMAL HIGH (ref 2.2–3.9)
M-Spike, %: 0.7 g/dL — ABNORMAL HIGH
Total Protein ELP: 7.2 g/dL (ref 6.0–8.5)

## 2024-05-09 NOTE — Telephone Encounter (Signed)
 Left message for patient to call back

## 2024-05-16 ENCOUNTER — Encounter: Payer: Self-pay | Admitting: Oncology

## 2024-05-17 ENCOUNTER — Other Ambulatory Visit: Payer: Self-pay

## 2024-05-17 ENCOUNTER — Ambulatory Visit: Admitting: Orthopaedic Surgery

## 2024-05-17 ENCOUNTER — Encounter: Payer: Self-pay | Admitting: Orthopaedic Surgery

## 2024-05-17 VITALS — Ht 62.0 in | Wt 197.0 lb

## 2024-05-17 DIAGNOSIS — M25552 Pain in left hip: Secondary | ICD-10-CM

## 2024-05-17 DIAGNOSIS — M87052 Idiopathic aseptic necrosis of left femur: Secondary | ICD-10-CM | POA: Diagnosis not present

## 2024-05-17 NOTE — Progress Notes (Signed)
 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  She was actually scheduled for surgery recently at [facility] but then her insurance company had surgery canceled because they do not have a contract with that hospital.  She is up here for evaluation treatment of her left hip AVN with opening that we can proceed with surgery soon on that left hip.  There is a MRI of the canopy system that is here for review.  She is not a diabetic.  She is ambulating with a cane.  She reports severe and daily left hip pain that is rapidly worsening.  It is detriment affecting her mobility, her quality of life and actives daily living.  Her BMI is 36.03.  On exam there were no significant blocks rotation of her left hip but there is severe pain in the groin with rotation.  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  There is impending subchondral fracture as well with potential for collapse.  We had a long and thorough discussion about anterior hip replacement surgery showing her hip replacement model and handout about hip replacement surgery.  I agree that she needs to have this done soon without any significant delay so we will see what we can do about getting her scheduled for a left total hip arthroplasty.

## 2024-05-19 ENCOUNTER — Ambulatory Visit (HOSPITAL_COMMUNITY)
Admission: RE | Admit: 2024-05-19 | Discharge: 2024-05-19 | Disposition: A | Discharge status: Home / Self Care | Source: Ambulatory Visit | Attending: Gastroenterology | Admitting: Gastroenterology

## 2024-05-19 ENCOUNTER — Telehealth: Payer: Self-pay

## 2024-05-19 DIAGNOSIS — K7460 Unspecified cirrhosis of liver: Secondary | ICD-10-CM | POA: Insufficient documentation

## 2024-05-19 NOTE — Telephone Encounter (Signed)
-----   Message from Jean Kramer sent at 05/16/2024  7:10 PM EDT ----- Regarding: call Note done Tell her B12 still low, I rec she take 1 po daily Her tests of the abnormal protein (M spike) are improved this time, we will just monitor

## 2024-05-21 ENCOUNTER — Ambulatory Visit: Payer: Self-pay | Admitting: Gastroenterology

## 2024-05-22 ENCOUNTER — Encounter: Payer: Self-pay | Admitting: Radiology

## 2024-05-22 ENCOUNTER — Telehealth: Payer: Self-pay

## 2024-05-22 DIAGNOSIS — K7460 Unspecified cirrhosis of liver: Secondary | ICD-10-CM

## 2024-05-22 NOTE — Telephone Encounter (Signed)
-----   Message from [facility] Alan HERO sent at 11/25/2023  4:43 PM EDT ----- Leigh Elspeth SQUIBB, MD to Edmundo Clarita HERO, CMA (Selected Message)    11/25/23  4:33 PM Result Note Jan can you please place recall for AFP in 6 months or can do it at time of her next US . Thanks

## 2024-05-22 NOTE — Telephone Encounter (Signed)
 Order placed for AFP. MyChart message to patient to go to the lab at her convenience

## 2024-05-23 ENCOUNTER — Telehealth: Payer: Self-pay

## 2024-05-23 NOTE — Telephone Encounter (Signed)
 I called patient to discuss scheduling THA.  Left voice mail message for return call.

## 2024-05-25 ENCOUNTER — Encounter (HOSPITAL_COMMUNITY): Payer: Self-pay

## 2024-05-25 ENCOUNTER — Other Ambulatory Visit: Payer: Self-pay | Admitting: Physician Assistant

## 2024-05-25 DIAGNOSIS — Z01818 Encounter for other preprocedural examination: Secondary | ICD-10-CM

## 2024-05-25 NOTE — Progress Notes (Signed)
 Surgical Instructions   Your procedure is scheduled on Tuesday, 05/30/24. Report to [facility] Main Entrance A at  7:00 A.M., then check in with the Admitting office. Any questions or running late day of surgery: call [phone]  Questions prior to your surgery date: call [phone], Monday-Friday, 8am-4pm. If you experience any cold or flu symptoms such as cough, fever, chills, shortness of breath, etc. between now and your scheduled surgery, please notify us  at the above number.     Remember:  Do not eat after midnight the night before your surgery-Monday  You may drink clear liquids until 6 AM, the morning of your surgery.   Clear liquids allowed are: Water, Non-Citrus Juices (without pulp), Carbonated Beverages, Clear Tea (no milk, honey, etc.), Black Coffee Only (NO MILK, CREAM OR POWDERED CREAMER of any kind), and Gatorade.   Please complete your PRE-SURGERY G2 Drink that was provided to you by 6 AM,  the morning of surgery.  Please, if able, drink it in one setting. DO NOT SIP.  This will be the last thing that you will drink on the morning of surgery.   Take these medicines the morning of surgery with A SIP OF WATER: busPIRone  (BUSPAR )   BREZTRI AEROSPHERE) Inhaler levothyroxine  (SYNTHROID )  midodrine  (PROAMATINE )  omeprazole  (PRILOSEC)      May take these medicines IF NEEDED: albuterol  (VENTOLIN  HFA) inhaler hydrOXYzine  (ATARAX )  ipratropium-albuterol  (DUONEB) Neb Tx ondansetron  (ZOFRAN )  Oxycodone  HCl    ORAL DIABETIC MEDICATION INSTRUCTIONS (Metformin)   The day before your procedure:  Take your Metformin as you do normally  The day of your procedure:  Do not take Metformin.     We will check your blood sugar levels during the admission process and again in Recovery before discharging you home   ONCE A WEEK INJECTIONS Semaglutide (OZEMPIC) and Mounjaro -  DO NOT TAKE 7 days prior to the procedure.  Last dose of Semaglutide [facility]) will be  on  Sunday, 05/21/24.  Last dose of Mounjaro will be on Saturday, 05/20/24.   One week prior to surgery, STOP taking any Aspirin  (unless otherwise instructed by your surgeon) Aleve, Naproxen, Ibuprofen, Motrin, Advil, Goody's, BC's, all herbal medications, fish oil, and non-prescription vitamins.                     Do NOT Smoke (Tobacco/Vaping) for 24 hours prior to your procedure.  If you use a CPAP at night, you may bring your mask/headgear for your overnight stay.   You will be asked to remove any contacts, glasses, piercing's, hearing aid's, dentures/partials prior to surgery. Please bring cases for these items if needed.    Patients discharged the day of surgery will not be allowed to drive home, and someone needs to stay with them for 24 hours.  SURGICAL WAITING ROOM VISITATION Patients may have no more than 2 support people in the waiting area - these visitors may rotate.   Pre-op nurse will coordinate an appropriate time for 1 ADULT support person, who may not rotate, to accompany patient in pre-op.  Children under the age of 52 must have an adult with them who is not the patient and must remain in the main waiting area with an adult.  If the patient needs to stay at the hospital during part of their recovery, the visitor guidelines for inpatient rooms apply.  Please refer to the [facility] website for the visitor guidelines for any additional information.   If you received a  COVID test during your pre-op visit  it is requested that you wear a mask when out in public, stay away from anyone that may not be feeling well and notify your surgeon if you develop symptoms. If you have been in contact with anyone that has tested positive in the last 10 days please notify you surgeon.      Pre-operative 4 CHG Bathing Instructions   You can play a key role in reducing the risk of infection after surgery. Your skin needs to be as free of germs as possible. You can reduce the number of germs  on your skin by washing with CHG (chlorhexidine  gluconate) soap before surgery. CHG is an antiseptic soap that kills germs and continues to kill germs even after washing.   DO NOT use if you have an allergy to chlorhexidine /CHG or antibacterial soaps. If your skin becomes reddened or irritated, stop using the CHG and notify one of our RNs at [phone].   Please shower with the CHG soap starting 4 days before surgery using the following schedule:     Please keep in mind the following:  DO NOT shave, including legs and underarms, starting the day of your first shower.   You may shave your face at any point before/day of surgery.  Place clean sheets on your bed the day you start using CHG soap. Use a clean washcloth (not used since being washed) for each shower. DO NOT sleep with pets once you start using the CHG.   CHG Shower Instructions:  Wash your face and private area with normal soap. If you choose to wash your hair, wash first with your normal shampoo.  After you use shampoo/soap, rinse your hair and body thoroughly to remove shampoo/soap residue.  Turn the water OFF and apply  bottle of CHG soap to a CLEAN washcloth.  Apply CHG soap ONLY FROM YOUR NECK DOWN TO YOUR TOES (washing for 3-5 minutes)  DO NOT use CHG soap on face, private areas, open wounds, or sores.  Pay special attention to the area where your surgery is being performed.  If you are having back surgery, having someone wash your back for you may be helpful. Wait 2 minutes after CHG soap is applied, then you may rinse off the CHG soap.  Pat dry with a clean towel  Put on clean clothes/pajamas   If you choose to wear lotion, please use ONLY the CHG-compatible lotions that are listed below.  Additional instructions for the day of surgery:  If you choose, you may shower the morning of surgery with an antibacterial soap.  DO NOT APPLY any lotions, deodorants, cologne, or perfumes.   Do not bring valuables to the  hospital. [facility] is not responsible for any belongings/valuables. Do not wear nail polish, gel polish, artificial nails, or any other type of covering on natural nails (fingers and toes) Do not wear jewelry or makeup Put on clean/comfortable clothes.  Please brush your teeth.  Ask your nurse before applying any prescription medications to the skin.     CHG Compatible Lotions   Aveeno Moisturizing lotion  Cetaphil Moisturizing Cream  Cetaphil Moisturizing Lotion  Clairol Herbal Essence Moisturizing Lotion, Dry Skin  Clairol Herbal Essence Moisturizing Lotion, Extra Dry Skin  Clairol Herbal Essence Moisturizing Lotion, Normal Skin  Curel Age Defying Therapeutic Moisturizing Lotion with Alpha Hydroxy  Curel Extreme Care Body Lotion  Curel Soothing Hands Moisturizing Hand Lotion  Curel Therapeutic Moisturizing Cream, Fragrance-Free  Curel Therapeutic Moisturizing Lotion,  Fragrance-Free  Curel Therapeutic Moisturizing Lotion, Original Formula  Eucerin Daily Replenishing Lotion  Eucerin Dry Skin Therapy Plus Alpha Hydroxy Crme  Eucerin Dry Skin Therapy Plus Alpha Hydroxy Lotion  Eucerin Original Crme  Eucerin Original Lotion  Eucerin Plus Crme Eucerin Plus Lotion  Eucerin TriLipid Replenishing Lotion  Keri Anti-Bacterial Hand Lotion  Keri Deep Conditioning Original Lotion Dry Skin Formula Softly Scented  Keri Deep Conditioning Original Lotion, Fragrance Free Sensitive Skin Formula  Keri Lotion Fast Absorbing Fragrance Free Sensitive Skin Formula  Keri Lotion Fast Absorbing Softly Scented Dry Skin Formula  Keri Original Lotion  Keri Skin Renewal Lotion Keri Silky Smooth Lotion  Keri Silky Smooth Sensitive Skin Lotion  Nivea Body Creamy Conditioning Oil  Nivea Body Extra Enriched Lotion  Nivea Body Original Lotion  Nivea Body Sheer Moisturizing Lotion Nivea Crme  Nivea Skin Firming Lotion  NutraDerm 30 Skin Lotion  NutraDerm Skin Lotion  NutraDerm Therapeutic Skin  Cream  NutraDerm Therapeutic Skin Lotion  ProShield Protective Hand Cream  Provon moisturizing lotion  Please read over the following fact sheets that you were given.

## 2024-05-26 ENCOUNTER — Encounter (HOSPITAL_COMMUNITY): Payer: Self-pay | Admitting: Physician Assistant

## 2024-05-26 ENCOUNTER — Encounter (HOSPITAL_COMMUNITY): Payer: Self-pay

## 2024-05-26 ENCOUNTER — Other Ambulatory Visit: Payer: Self-pay

## 2024-05-26 ENCOUNTER — Encounter (HOSPITAL_COMMUNITY)
Admission: RE | Admit: 2024-05-26 | Discharge: 2024-05-26 | Disposition: A | Discharge status: Home / Self Care | Source: Ambulatory Visit | Attending: Orthopaedic Surgery | Admitting: Orthopaedic Surgery

## 2024-05-26 DIAGNOSIS — Z87891 Personal history of nicotine dependence: Secondary | ICD-10-CM | POA: Insufficient documentation

## 2024-05-26 DIAGNOSIS — E088 Diabetes mellitus due to underlying condition with unspecified complications: Secondary | ICD-10-CM

## 2024-05-26 DIAGNOSIS — Z01818 Encounter for other preprocedural examination: Secondary | ICD-10-CM | POA: Diagnosis present

## 2024-05-26 DIAGNOSIS — I9589 Other hypotension: Secondary | ICD-10-CM | POA: Diagnosis not present

## 2024-05-26 DIAGNOSIS — K7460 Unspecified cirrhosis of liver: Secondary | ICD-10-CM | POA: Insufficient documentation

## 2024-05-26 DIAGNOSIS — E119 Type 2 diabetes mellitus without complications: Secondary | ICD-10-CM | POA: Insufficient documentation

## 2024-05-26 DIAGNOSIS — K219 Gastro-esophageal reflux disease without esophagitis: Secondary | ICD-10-CM | POA: Insufficient documentation

## 2024-05-26 DIAGNOSIS — D472 Monoclonal gammopathy: Secondary | ICD-10-CM | POA: Diagnosis not present

## 2024-05-26 DIAGNOSIS — Z79899 Other long term (current) drug therapy: Secondary | ICD-10-CM | POA: Insufficient documentation

## 2024-05-26 DIAGNOSIS — M1610 Unilateral primary osteoarthritis, unspecified hip: Secondary | ICD-10-CM | POA: Diagnosis not present

## 2024-05-26 HISTORY — DX: Type 2 diabetes mellitus without complications: E11.9

## 2024-05-26 LAB — CBC
HCT: 39.8 % (ref 36.0–46.0)
Hemoglobin: 13.1 g/dL (ref 12.0–15.0)
MCH: 32.9 pg (ref 26.0–34.0)
MCHC: 32.9 g/dL (ref 30.0–36.0)
MCV: 100 fL (ref 80.0–100.0)
Platelets: 267 K/uL (ref 150–400)
RBC: 3.98 MIL/uL (ref 3.87–5.11)
RDW: 14.3 % (ref 11.5–15.5)
WBC: 11.9 K/uL — ABNORMAL HIGH (ref 4.0–10.5)
nRBC: 0 % (ref 0.0–0.2)

## 2024-05-26 LAB — BASIC METABOLIC PANEL WITH GFR
Anion gap: 11 (ref 5–15)
BUN: 14 mg/dL (ref 8–23)
CO2: 23 mmol/L (ref 22–32)
Calcium: 9.3 mg/dL (ref 8.9–10.3)
Chloride: 102 mmol/L (ref 98–111)
Creatinine, Ser: 1.03 mg/dL — ABNORMAL HIGH (ref 0.44–1.00)
GFR, Estimated: 57 mL/min — ABNORMAL LOW (ref >60)
Glucose, Bld: 111 mg/dL — ABNORMAL HIGH (ref 70–99)
Potassium: 4.6 mmol/L (ref 3.5–5.1)
Sodium: 136 mmol/L (ref 135–145)

## 2024-05-26 LAB — HEMOGLOBIN A1C
Hgb A1c MFr Bld: 5 % (ref 4.8–5.6)
Mean Plasma Glucose: 96.8 mg/dL

## 2024-05-26 LAB — TYPE AND SCREEN
ABO/RH(D): O POS
Antibody Screen: NEGATIVE

## 2024-05-26 LAB — SURGICAL PCR SCREEN
MRSA, PCR: NEGATIVE
Staphylococcus aureus: NEGATIVE

## 2024-05-26 LAB — GLUCOSE, CAPILLARY: Glucose-Capillary: 107 mg/dL — ABNORMAL HIGH (ref 70–99)

## 2024-05-26 NOTE — Progress Notes (Signed)
 Upon arrival to the PAT appointment the patient had a BP of 70/50's.  Per the patient she had only taken midodrine  today however she took her lasix  this morning and has not been drinking fluids.  Lynwood FURY NP notified and came to see the patient.  Patient will be going to see Dr. Okey ([address] office) on Monday at 2:20pm.

## 2024-05-26 NOTE — Progress Notes (Signed)
 PCP - Dr. Kerney SHAUNNA Reese Cardiologist - denies  PPM/ICD - Denies Device Orders - n/a Rep Notified - n/a  Chest x-ray - n/a EKG - 05-26-24 Stress Test - denies ECHO - 08-16-23 Cardiac Cath - denies  Sleep Study - denies CPAP - n/a  Fasting Blood Sugar - does not know as she does not check them Checks Blood Sugar  - does not check blood sugars  Last dose of GLP1 agonist-  Semaglutide (OZEMPIC)  GLP1 instructions: hold 7 day's, last dose on Sunday 05-22-24  Blood Thinner Instructions: Denies Aspirin  Instructions: denies  ERAS Protcol - clears until 0600 PRE-SURGERY Ensure or G2- G2  COVID TEST- n/a   Anesthesia review: yes, stroke, Hypotension, DM, COPD  Patient denies shortness of breath, fever, cough and chest pain at PAT appointment. Patient denies any respiratory issues at this time.    All instructions explained to the patient, with a verbal understanding of the material. Patient agrees to go over the instructions while at home for a better understanding. Patient also instructed to self quarantine after being tested for COVID-19. The opportunity to ask questions was provided.

## 2024-05-26 NOTE — Progress Notes (Addendum)
 Surgical Instructions   Your procedure is scheduled on Tuesday, November 11th. Report to [facility] Main Entrance A at 7:00 A.M., then check in with the Admitting office. Any questions or running late day of surgery: call [phone]  Questions prior to your surgery date: call [phone], Monday-Friday, 8am-4pm. If you experience any cold or flu symptoms such as cough, fever, chills, shortness of breath, etc. between now and your scheduled surgery, please notify us  at the above number.     Remember:  Do not eat after midnight the night before your surgery  You may drink clear liquids until 6:00 the morning of your surgery.   Clear liquids allowed are: Water, Non-Citrus Juices (without pulp), Carbonated Beverages, Clear Tea (no milk, honey, etc.), Black Coffee Only (NO MILK, CREAM OR POWDERED CREAMER of any kind), and Gatorade.  Patient Instructions  The day of surgery (if you have diabetes): Drink ONE (1) 12 oz G2 given to you in your pre admission testing appointment by 6:00 the morning of surgery. Drink in one sitting. Do not sip.  This drink was given to you during your hospital pre-op appointment visit.   Nothing else to drink after completing the 12 oz bottle of G2.         If you have questions, please contact your surgeon's office.    Take these medicines the morning of surgery with A SIP OF WATER  busPIRone  (BUSPAR )   BREZTRI AEROSPHERE) Inhaler levothyroxine  (SYNTHROID )  midodrine  (PROAMATINE )  omeprazole  (PRILOSEC)    May take these medicines IF NEEDED: albuterol  (VENTOLIN  HFA) inhaler - bring with you on day of surgery hydrOXYzine  (ATARAX )  ipratropium-albuterol  (DUONEB) Neb Tx ondansetron  (ZOFRAN )  Oxycodone  HCl   One week prior to surgery, STOP taking any Aspirin  (unless otherwise instructed by your surgeon) Aleve, Naproxen, Ibuprofen, Motrin, Advil, Goody's, BC's, all herbal medications, fish oil, and non-prescription vitamins.  HOLD your metFORMIN  (GLUCOPHAGE-XR) the day of surgery.     Mounjaro -  DO NOT TAKE 7 days prior to the procedure.  Last dose of Mounjaro will be on Saturday, 05/20/24.          Do NOT Smoke (Tobacco/Vaping) for 24 hours prior to your procedure.  If you use a CPAP at night, you may bring your mask/headgear for your overnight stay.   You will be asked to remove any contacts, glasses, piercing's, hearing aid's, dentures/partials prior to surgery. Please bring cases for these items if needed.    Patients discharged the day of surgery will not be allowed to drive home, and someone needs to stay with them for 24 hours.  SURGICAL WAITING ROOM VISITATION Patients may have no more than 2 support people in the waiting area - these visitors may rotate.   Pre-op nurse will coordinate an appropriate time for 1 ADULT support person, who may not rotate, to accompany patient in pre-op.  Children under the age of 70 must have an adult with them who is not the patient and must remain in the main waiting area with an adult.  If the patient needs to stay at the hospital during part of their recovery, the visitor guidelines for inpatient rooms apply.  Please refer to the [facility] website for the visitor guidelines for any additional information.   If you received a COVID test during your pre-op visit  it is requested that you wear a mask when out in public, stay away from anyone that may not be feeling well and notify your surgeon if  you develop symptoms. If you have been in contact with anyone that has tested positive in the last 10 days please notify you surgeon.      Pre-operative 4 CHG Bathing Instructions   You can play a key role in reducing the risk of infection after surgery. Your skin needs to be as free of germs as possible. You can reduce the number of germs on your skin by washing with CHG (chlorhexidine  gluconate) soap before surgery. CHG is an antiseptic soap that kills germs and continues to kill germs even  after washing.   DO NOT use if you have an allergy to chlorhexidine /CHG or antibacterial soaps. If your skin becomes reddened or irritated, stop using the CHG and notify one of our RNs at [phone].   Please shower with the CHG soap starting 4 days before surgery using the following schedule:     Please keep in mind the following:  DO NOT shave, including legs and underarms, starting the day of your first shower.   Place clean sheets on your bed the day you start using CHG soap. Use a clean washcloth (not used since being washed) for each shower. DO NOT sleep with pets once you start using the CHG.   CHG Shower Instructions:  Wash your face and private area with normal soap. If you choose to wash your hair, wash first with your normal shampoo.  After you use shampoo/soap, rinse your hair and body thoroughly to remove shampoo/soap residue.  Turn the water OFF and apply  bottle of CHG soap to a CLEAN washcloth.  Apply CHG soap ONLY FROM YOUR NECK DOWN TO YOUR TOES (washing for 3-5 minutes)  DO NOT use CHG soap on face, private areas, open wounds, or sores.  Pay special attention to the area where your surgery is being performed.  If you are having back surgery, having someone wash your back for you may be helpful. Wait 2 minutes after CHG soap is applied, then you may rinse off the CHG soap.  Pat dry with a clean towel  Put on clean clothes/pajamas   If you choose to wear lotion, please use ONLY the CHG-compatible lotions that are listed below.  Additional instructions for the day of surgery:  If you choose, you may shower the morning of surgery with an antibacterial soap.  DO NOT APPLY any lotions, deodorants, or perfumes.   Do not bring valuables to the hospital. [facility] is not responsible for any belongings/valuables. Do not wear nail polish, gel polish, artificial nails, or any other type of covering on natural nails (fingers and toes) Do not wear jewelry or makeup Put on  clean/comfortable clothes.  Please brush your teeth.  Ask your nurse before applying any prescription medications to the skin.     CHG Compatible Lotions   Aveeno Moisturizing lotion  Cetaphil Moisturizing Cream  Cetaphil Moisturizing Lotion  Clairol Herbal Essence Moisturizing Lotion, Dry Skin  Clairol Herbal Essence Moisturizing Lotion, Extra Dry Skin  Clairol Herbal Essence Moisturizing Lotion, Normal Skin  Curel Age Defying Therapeutic Moisturizing Lotion with Alpha Hydroxy  Curel Extreme Care Body Lotion  Curel Soothing Hands Moisturizing Hand Lotion  Curel Therapeutic Moisturizing Cream, Fragrance-Free  Curel Therapeutic Moisturizing Lotion, Fragrance-Free  Curel Therapeutic Moisturizing Lotion, Original Formula  Eucerin Daily Replenishing Lotion  Eucerin Dry Skin Therapy Plus Alpha Hydroxy Crme  Eucerin Dry Skin Therapy Plus Alpha Hydroxy Lotion  Eucerin Original Crme  Eucerin Original Lotion  Eucerin Plus Crme Eucerin Plus Lotion  Eucerin TriLipid Replenishing Lotion  Keri Anti-Bacterial Hand Lotion  Keri Deep Conditioning Original Lotion Dry Skin Formula Softly Scented  Keri Deep Conditioning Original Lotion, Fragrance Free Sensitive Skin Formula  Keri Lotion Fast Absorbing Fragrance Free Sensitive Skin Formula  Keri Lotion Fast Absorbing Softly Scented Dry Skin Formula  Keri Original Lotion  Keri Skin Renewal Lotion Keri Silky Smooth Lotion  Keri Silky Smooth Sensitive Skin Lotion  Nivea Body Creamy Conditioning Oil  Nivea Body Extra Enriched Lotion  Nivea Body Original Lotion  Nivea Body Sheer Moisturizing Lotion Nivea Crme  Nivea Skin Firming Lotion  NutraDerm 30 Skin Lotion  NutraDerm Skin Lotion  NutraDerm Therapeutic Skin Cream  NutraDerm Therapeutic Skin Lotion  ProShield Protective Hand Cream  Provon moisturizing lotion  Please read over the following fact sheets that you were given.

## 2024-05-26 NOTE — Progress Notes (Signed)
 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  I spoke with patient and her preadmission testing appointment due to history of persistent hypotension. On arrival today she reported feeling like her blood pressure was low, felt swimmy headed.  Blood pressure 78/60 by manual check.  She did take her morning dose of midodrine .  However, she states she also took 20 mg of Lasix  this morning and has not had much to eat or drink (PCP prescribes Lasix  to use as needed for swelling).  She also recently started Ozempic about 4 months ago and has reduced overall p.o. intake.  She does not regularly check her blood pressure at home.  She denies any history of frank syncope.  She had an echo at [facility] in January 2025 showing normal LV systolic function, normal diastolic parameters, normal RV function, no significant valvular abnormalities.  Review of previous encounters in epic shows similar prior episodes of hypotension.  When seen by oncology 02/04/2024 her blood pressure was 86/53 and she was given 1 L of fluid with improvement. On exam she is well-appearing, no appreciable LE edema.  She is in a wheelchair due to severe pain from hip OA.  Preop labs reviewed, unremarkable.  Reviewed history with anesthesiologist Dr. FABIENE Needle.  Given persistent hypotension, he recommended she be evaluated by cardiology prior to undergoing elective surgery.  Fortunately, cardiology was able to assist in expediting evaluation.  She is scheduled to see Dr. Okey on 05/29/2024.  EKG 05/26/2024: Normal sinus rhythm.  Rate 84. ST & T wave abnormality, consider inferolateral ischemia  TTE 08/16/2023: Conclusions: 1.   Technically difficult study with suboptimal imaging. 2.  Left ventricular ejection fraction is 55 to 60%. 3.  Left ventricular diastolic function is pseudonormal. 4.  Left atrium is normal in size. 5.  There is mild aortic sclerosis without evidence of stenosis. 6.  There is mild tricuspid valve regurgitation.    Lynwood Geofm RIGGERS [facility] Short Stay Center/Anesthesiology Phone [phone] 05/26/2024 4:06 PM

## 2024-05-29 ENCOUNTER — Ambulatory Visit: Attending: Internal Medicine | Admitting: Internal Medicine

## 2024-05-29 ENCOUNTER — Encounter: Payer: Self-pay | Admitting: Internal Medicine

## 2024-05-29 ENCOUNTER — Telehealth: Payer: Self-pay

## 2024-05-29 VITALS — BP 100/62 | HR 80 | Ht 62.0 in | Wt 205.4 lb

## 2024-05-29 DIAGNOSIS — R0609 Other forms of dyspnea: Secondary | ICD-10-CM | POA: Diagnosis present

## 2024-05-29 DIAGNOSIS — E785 Hyperlipidemia, unspecified: Secondary | ICD-10-CM | POA: Insufficient documentation

## 2024-05-29 DIAGNOSIS — M87052 Idiopathic aseptic necrosis of left femur: Secondary | ICD-10-CM | POA: Insufficient documentation

## 2024-05-29 DIAGNOSIS — Z01818 Encounter for other preprocedural examination: Secondary | ICD-10-CM | POA: Insufficient documentation

## 2024-05-29 DIAGNOSIS — R011 Cardiac murmur, unspecified: Secondary | ICD-10-CM | POA: Insufficient documentation

## 2024-05-29 NOTE — Telephone Encounter (Signed)
 Patients was not cleared by cardiology today  Tomorrow's surgery will be postponed.  I called patient and son and left a voice mail for return call to discuss.  Also sent patient a text message.

## 2024-05-29 NOTE — Patient Instructions (Signed)
 Medication Instructions:  Your physician recommends that you continue on your current medications as directed. Please refer to the Current Medication list given to you today.  *If you need a refill on your cardiac medications before your next appointment, please call your pharmacy*  Lab Work: TODAY: Lipid panel, NMR, LP(a), cortisol If you have labs (blood work) drawn today and your tests are completely normal, you will receive your results only by: MyChart Message (if you have MyChart) OR A paper copy in the mail If you have any lab test that is abnormal or we need to change your treatment, we will call you to review the results.  Testing/Procedures: ECHO Your physician has requested that you have an echocardiogram. Echocardiography is a painless test that uses sound waves to create images of your heart. It provides your doctor with information about the size and shape of your heart and how well your heart's chambers and valves are working. This procedure takes approximately one hour. There are no restrictions for this procedure. Please do NOT wear cologne, perfume, aftershave, or lotions (deodorant is allowed). Please arrive 15 minutes prior to your appointment time.  Please note: We ask at that you not bring children with you during ultrasound (echo/ vascular) testing. Due to room size and safety concerns, children are not allowed in the ultrasound rooms during exams. Our front office staff cannot provide observation of children in our lobby area while testing is being conducted. An adult accompanying a patient to their appointment will only be allowed in the ultrasound room at the discretion of the ultrasound technician under special circumstances. We apologize for any inconvenience.  [facility] Your physician has requested that you have a lexiscan myoview. For further information please visit [email or web address] Please follow instruction sheet, as given.   Follow-Up: At [facility], you and your health needs are our priority.  As part of our continuing mission to provide you with exceptional heart care, our providers are all part of one team.  This team includes your primary Cardiologist (physician) and Advanced Practice Providers or APPs (Physician Assistants and Nurse Practitioners) who all work together to provide you with the care you need, when you need it.  Your next appointment:   To be determined after test results are reviewed.  Provider:   Vina Gull, MD   We recommend signing up for the patient portal called MyChart.  Sign up information is provided on this After Visit Summary.  MyChart is used to connect with patients for Virtual Visits (Telemedicine).  Patients are able to view lab/test results, encounter notes, upcoming appointments, etc.  Non-urgent messages can be sent to your provider as well.   To learn more about what you can do with MyChart, go to [email or web address].   Other Instructions Lexiscan Myoview (Stress Test) Instructions  Please arrive 15 minutes prior to your appointment time for registration and insurance purposes.   The test will take approximately 3 to 4 hours to complete; you may bring reading material.  If someone comes with you to your appointment, they will need to remain in the main lobby due to limited space in the testing area. **If you are pregnant or breastfeeding, please notify the nuclear lab prior to your appointment**   How to prepare for your Myocardial Perfusion Test: Do not eat or drink 3 hours prior to your test, except you may have water. Do not consume products containing caffeine (regular or decaffeinated) 12 hours prior to your test. (ex: coffee, chocolate, sodas,  tea). Do bring a list of your current medications with you.  If not listed below, you may take your medications as normal. Do wear comfortable clothes (no dresses or overalls) and walking shoes, tennis shoes preferred (No heels or open toe  shoes are allowed). Do NOT wear cologne, perfume, aftershave, or lotions (deodorant is allowed). If these instructions are not followed, your test will have to be rescheduled.   Please report to [address], [place], [place] [address] for your test.  If you have questions or concerns about your appointment, you can call the Nuclear Lab at [phone].   If you cannot keep your appointment, please provide 24 hours notification to the Nuclear Lab, to avoid a possible $50 charge to your account.

## 2024-05-29 NOTE — Progress Notes (Signed)
 Cardiology Office Note   Date:  05/29/2024   ID:  Jean Kramer, DOB Mar 29, 1951, MRN [id number]  PCP:  Pia Kerney SQUIBB, MD  Cardiologist:   Vina Gull, MD   PT presents for preop risk assessment for hip surgery     History of Present Illness: Jean Kramer is a 73 y.o. female with a history of HTN and hypotension (on midodrine ), ITP, MGUS,COPD, tobacco use (since  age 73  QUit in 2021) and  DJD,   Being evaluated for hip surgery which is scheduled for tomorrow  CT of abdomen in 2023 showed fatty liver and early cirrhosis  Advanced atherosclerosis of aortoiliac vessels  Echo at [facility] showed Normal LV and RV function     Aortic valve sclerosis  The pt's activity is severely limited by DJD  Does not walk much  Doesn't do much work around the house due to hip pain. Denies CP at rest  Breathing is OK at rest   No PND  Occasional dizziness   Has been on midodrine      Current Meds  Medication Sig   albuterol  (VENTOLIN  HFA) 108 (90 Base) MCG/ACT inhaler Inhale 2 puffs into the lungs every 6 (six) hours as needed for wheezing or shortness of breath.   alendronate (FOSAMAX) 70 MG tablet Take 70 mg by mouth every Friday.   Biotin w/ Vitamins C & E (HAIR/SKIN/NAILS PO) Take 2 each by mouth daily. (gummies)   Budeson-Glycopyrrol-Formoterol  (BREZTRI AEROSPHERE) 160-9-4.8 MCG/ACT AERO Inhale 2 puffs into the lungs 2 (two) times daily.   busPIRone  (BUSPAR ) 15 MG tablet Take 15 mg by mouth 2 (two) times daily.   Cholecalciferol  (VITAMIN D -3 PO) Take 1 capsule by mouth daily.   clotrimazole (MYCELEX) 10 MG troche Take 10 mg by mouth 4 (four) times daily as needed (trush (tongue pain) caused by [name]).   Cyanocobalamin  (B-12 PO) Take 2 each by mouth daily. (gummies)   docusate sodium  (COLACE) 100 MG capsule Take 200 mg by mouth 2 (two) times daily. 3 tabs at bedtime   hydrOXYzine  (ATARAX ) 25 MG tablet Take 25 mg by mouth 2 (two) times daily.   ipratropium-albuterol  (DUONEB)  0.5-2.5 (3) MG/3ML SOLN Inhale 3 mLs into the lungs every 6 (six) hours as needed for shortness of breath.   levothyroxine  (SYNTHROID ) 50 MCG tablet Take 50 mcg by mouth daily.   lubiprostone (AMITIZA) 24 MCG capsule Take 24 mcg by mouth 2 (two) times daily.   metFORMIN (GLUCOPHAGE-XR) 500 MG 24 hr tablet Take 500 mg by mouth daily with supper.   midodrine  (PROAMATINE ) 10 MG tablet Take 1 tablet (10 mg total) by mouth 3 (three) times daily with meals.   omeprazole  (PRILOSEC) 40 MG capsule TAKE 1 CAPSULE BY MOUTH DAILY   ondansetron  (ZOFRAN ) 4 MG tablet Take 1 tablet (4 mg total) by mouth every 4 (four) hours as needed for nausea.   Oxycodone  HCl 20 MG TABS Take 20 mg by mouth every 8 (eight) hours as needed (severe pain).   pramipexole  (MIRAPEX ) 1 MG tablet Take 1 mg by mouth at bedtime.   QUEtiapine  (SEROQUEL ) 200 MG tablet Take 200 mg by mouth at bedtime.   Semaglutide (OZEMPIC, 0.25 OR 0.5 MG/DOSE, [place]) Inject 0.5 mg into the skin every Sunday.     Allergies:   Patient has no known allergies.   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 Her smoking use included cigarettes. She has never used smokeless tobacco. She reports that  she does not drink alcohol and does not use drugs.   Family History:  The patient's family history includes Brain cancer in her brother.    ROS:  Please see the history of present illness. All other systems are reviewed and  Negative to the above problem except as noted.    PHYSICAL EXAM: VS:  BP 100/62   Pulse 80   Ht 5' 2 (1.575 m)   Wt 205 lb 6.4 oz (93.2 kg)   SpO2 95%   BMI 37.57 kg/m   GEN: Obese 73 yo in NAD  Examined in chair HEENT: normal  Neck: no JVD, carotid bruits, Cardiac: RRR; III/VI systolic murmur LSB   Respiratory:  clear to auscultation  Moving air  GI: soft, nontender  Ext  No LE edema   Feet warm  EKG:  EKG is not ordered today.  NSR   ST depresssin T wave inversion, cannot exclude ischemai    Lipid Panel No results found for: CHOL, TRIG, HDL, CHOLHDL, VLDL, LDLCALC, LDLDIRECT    Wt Readings from Last 3 Encounters:  05/29/24 205 lb 6.4 oz (93.2 kg)  05/26/24 198 lb 3.2 oz (89.9 kg)  05/17/24 197 lb (89.4 kg)      ASSESSMENT AND PLAN:  1  Preop cardiac risk assessment   Pt is a 73 yo with hx of COPD, tobacco use and DJD  Being evalauted for hip surgery  Review of records pt has signficant atherosclerosis of aorta   SHe is not active enough to assess functional tolerance due to her DJD    Exam :  Pt's BP is OK (on midodrine ) She has systolic murmur consistent with aortic valve sclerosis vs stenosis  EKG Pt in SR   She has  ST changes concerning for ischemia, may reflect some LVH   I cannot risk stratify   Need more information Would recomm Lexiscan Myoview Would get echo to assess LVEF and AV  Pt should be on a statin   Will get lipids today and follow up  2  Hx hypotension  Will check cortisol Continue midodrine    3  Atherosclerosis   The pt needs to be on a statin givne vascular dz    Will check lipid panel       Will need long term follow up in cardiology    Current medicines are reviewed at length with the patient  today.  The patient does not have concerns regarding medicines.  Signed, Vina Gull, MD  05/29/2024 3:05 PM    [facility] [address] [place], [place], [place]  [address]  Phone: [phone]; Fax: [phone]

## 2024-05-29 NOTE — H&P (Signed)
 TOTAL HIP ADMISSION H&P  Patient is admitted for a left total hip arthroplasty.  Subjective:  Chief Complaint: left hip pain  HPI: Jean Kramer, 73 y.o. female, has a history of pain and functional disability in the left hip(s) due to avascular necrosis and patient has failed non-surgical conservative treatments for greater than 12 weeks to include NSAID's and/or analgesics, use of assistive devices, weight reduction as appropriate, and activity modification.  Onset of symptoms was abrupt starting a few months ago with rapidlly worsening course since that time.The patient noted no past surgery on the left hip(s).  Patient currently rates pain in the left hip at 10 out of 10 with activity. Patient has night pain, worsening of pain with activity and weight bearing, trendelenberg gait, pain that interfers with activities of daily living, and pain with passive range of motion. Patient has evidence of subchondral cysts, periarticular osteophytes, joint space narrowing, and irregularity of the femoral head by imaging studies. This condition presents safety issues increasing the risk of falls. This patient has had avascular necrosis of the hip, acetabular fracture, hip dysplasia.  There is no current active infection.  Patient Active Problem List   Diagnosis Date Noted   Avascular necrosis of left hip (HCC) 05/29/2024   Low serum vitamin B12 02/17/2024   Dehydration 02/04/2024   Colon polyps 07/23/2023   Edema 07/22/2023   Leg edema 07/22/2023   Monoclonal gammopathy of undetermined significance 07/20/2023   History of ITP 06/10/2023   Hypercalcemia 06/10/2023   Hypotension 11/25/2021   COPD (chronic obstructive pulmonary disease) (HCC)    Hypothyroidism    Bipolar disorder (HCC)    Dental infection    Vision disturbance    Elevated TSH    Cirrhosis of liver with ascites (HCC)    Dyspnea 07/01/2021   Hematuria, gross 07/01/2021   Abnormal transaminases 03/21/2021   Tobacco abuse  03/21/2021   Osteopenia after menopause 03/21/2021   Severe thrombocytopenia 03/05/2021   Acute blood loss anemia    Melena    Hypovolemic shock (HCC)    Postoperative examination 02/18/2017   Intraductal papilloma of breast, 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   Leukocytosis 02/26/2009   History of splenectomy 02/27/1999   Chronic ITP (idiopathic thrombocytopenia) (HCC) 10/26/1997   Acute ITP (HCC) 09/25/1997   Past Medical History:  Diagnosis Date   Abnormal transaminases 03/21/2021   Aortic atherosclerosis    Bipolar 1 disorder (HCC)    Chronic ITP (idiopathic thrombocytopenia) (HCC)    Cirrhosis (HCC)    COPD (chronic obstructive pulmonary disease) (HCC)    Diabetes mellitus without complication (HCC)    type 2   Diverticulosis    Fatty liver    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   Current Outpatient Medications  Medication Sig Dispense Refill Last Dose/Taking   albuterol  (VENTOLIN  HFA) 108 (90 Base) MCG/ACT inhaler Inhale 2 puffs into the lungs every 6 (six) hours as needed for wheezing or shortness of breath.   Taking As Needed   alendronate (FOSAMAX) 70 MG  tablet Take 70 mg by mouth every Friday.   Taking   Biotin w/ Vitamins C & E (HAIR/SKIN/NAILS PO) Take 2 each by mouth daily. (gummies)   Taking   Budeson-Glycopyrrol-Formoterol  (BREZTRI AEROSPHERE) 160-9-4.8 MCG/ACT AERO Inhale 2 puffs into the lungs 2 (two) times daily.   Taking   busPIRone  (BUSPAR ) 15 MG tablet Take 15 mg by mouth 2 (two) times daily.   Taking   Cholecalciferol  (VITAMIN D -3 PO) Take 1 capsule by  mouth daily.   Taking   clotrimazole (MYCELEX) 10 MG troche Take 10 mg by mouth 4 (four) times daily as needed (trush (tongue pain) caused by [name]).   Taking As Needed   Cyanocobalamin  (B-12 PO) Take 2 each by mouth daily. (gummies)   Taking   docusate sodium  (COLACE) 100 MG capsule Take 200 mg by mouth 2 (two) times daily. 3 tabs at bedtime   Taking   hydrOXYzine  (ATARAX ) 25 MG tablet Take 25 mg by mouth 2 (two) times daily.   Taking   ipratropium-albuterol  (DUONEB) 0.5-2.5 (3) MG/3ML SOLN Inhale 3 mLs into the lungs every 6 (six) hours as needed for shortness of breath.   Taking As Needed   levothyroxine  (SYNTHROID ) 50 MCG tablet Take 50 mcg by mouth daily.   Taking   lubiprostone (AMITIZA) 24 MCG capsule Take 24 mcg by mouth 2 (two) times daily.   Taking   metFORMIN (GLUCOPHAGE-XR) 500 MG 24 hr tablet Take 500 mg by mouth daily with supper.   Taking   midodrine  (PROAMATINE ) 10 MG tablet Take 1 tablet (10 mg total) by mouth 3 (three) times daily with meals. 90 tablet 3 Taking   omeprazole  (PRILOSEC) 40 MG capsule TAKE 1 CAPSULE BY MOUTH DAILY 90 capsule 1 Taking   ondansetron  (ZOFRAN ) 4 MG tablet Take 1 tablet (4 mg total) by mouth every 4 (four) hours as needed for nausea. 90 tablet 3 Taking As Needed   Oxycodone  HCl 20 MG TABS Take 20 mg by mouth every 8 (eight) hours as needed (severe pain).   Taking As Needed   pramipexole  (MIRAPEX ) 1 MG tablet Take 1 mg by mouth at bedtime.   Taking   QUEtiapine  (SEROQUEL ) 200 MG tablet Take 200 mg by mouth at bedtime.   Taking   Semaglutide (OZEMPIC, 0.25 OR 0.5 MG/DOSE, [place]) Inject 0.5 mg into the skin every Sunday.   Taking   No Known Allergies  Social History   Tobacco Use   Smoking status: Former    Types: Cigarettes   Smokeless tobacco: Never  Substance Use Topics   Alcohol use: Never    Family History  Problem Relation Age of Onset   Brain cancer Brother    Colon cancer Neg Hx    Rectal cancer Neg Hx    Stomach cancer Neg Hx     Esophageal cancer Neg Hx      Review of Systems  Objective:  Physical Exam Vitals reviewed.  Constitutional:      Appearance: Normal appearance. She is obese.  HENT:     Head: Normocephalic and atraumatic.  Eyes:     Extraocular Movements: Extraocular movements intact.     Pupils: Pupils are equal, round, and reactive to light.  Cardiovascular:     Rate and Rhythm: Normal rate and regular rhythm.  Pulmonary:     Effort: Pulmonary effort is normal.     Breath sounds: Normal breath sounds.  Abdominal:     Palpations: Abdomen is soft.  Musculoskeletal:  Cervical back: Normal range of motion and neck supple.     Left hip: Tenderness and bony tenderness present. Decreased range of motion. Decreased strength.  Neurological:     Mental Status: She is alert and oriented to person, place, and time.  Psychiatric:        Behavior: Behavior normal.     Vital signs in last 24 hours: Pulse Rate:  [80] 80 (11/10 1428) BP: (100)/(62) 100/62 (11/10 1428) SpO2:  [95 %] 95 % (11/10 1428) Weight:  [93.2 kg] 93.2 kg (11/10 1428)  Labs:   Estimated body mass index is 37.57 kg/m as calculated from the following:   Height as of 05/29/24: 5' 2 (1.575 m).   Weight as of 05/29/24: 93.2 kg.   Imaging Review Plain radiographs demonstrate AVN of the left hip(s). The bone quality appears to be good for age and reported activity level.      Assessment/Plan:  End stage avascular necrosis, left hip(s)  The patient history, physical examination, clinical judgement of the provider and imaging studies are consistent with AVN of the left hip(s) and total hip arthroplasty is deemed medically necessary. The treatment options including medical management, injection therapy, arthroscopy and arthroplasty were discussed at length. The risks and benefits of total hip arthroplasty were presented and reviewed. The risks due to aseptic loosening, infection, stiffness, dislocation/subluxation,   thromboembolic complications and other imponderables were discussed.  The patient acknowledged the explanation, agreed to proceed with the plan and consent was signed. Patient is being admitted for inpatient treatment for surgery, pain control, PT, OT, prophylactic antibiotics, VTE prophylaxis, progressive ambulation and ADL's and discharge planning.The patient is planning to be discharged home with home health services

## 2024-05-29 NOTE — Telephone Encounter (Signed)
 Son called back and confirmed tomorrow is cancelled.  Tests are not scheduled until 12/18.  I told him I would see if we could do anything to get them moved up.  If I can, I will let them know.

## 2024-05-30 ENCOUNTER — Ambulatory Visit (HOSPITAL_COMMUNITY): Admission: RE | Admit: 2024-05-30 | Source: Home / Self Care | Admitting: Orthopaedic Surgery

## 2024-05-30 ENCOUNTER — Encounter: Admission: RE | Payer: Self-pay

## 2024-05-30 LAB — NMR, LIPOPROFILE
Cholesterol, Total: 208 mg/dL — ABNORMAL HIGH (ref 100–199)
HDL Particle Number: 26.5 umol/L — ABNORMAL LOW (ref >=30.5)
HDL-C: 78 mg/dL (ref >39)
LDL Particle Number: 987 nmol/L (ref <1000)
LDL Size: 22.5 nm (ref >20.5)
LDL-C (NIH Calc): 114 mg/dL — ABNORMAL HIGH (ref 0–99)
LP-IR Score: <25 (ref <=45)
Small LDL Particle Number: <90 nmol/L (ref <=527)
Triglycerides: 91 mg/dL (ref 0–149)

## 2024-05-30 LAB — LIPID PANEL
Chol/HDL Ratio: 2.8 ratio (ref 0.0–4.4)
Cholesterol, Total: 208 mg/dL — ABNORMAL HIGH (ref 100–199)
HDL: 74 mg/dL (ref >39)
LDL Chol Calc (NIH): 118 mg/dL — ABNORMAL HIGH (ref 0–99)
Triglycerides: 88 mg/dL (ref 0–149)
VLDL Cholesterol Cal: 16 mg/dL (ref 5–40)

## 2024-05-30 LAB — LIPOPROTEIN A (LPA): Lipoprotein (a): <8.4 nmol/L (ref <75.0)

## 2024-05-30 LAB — CORTISOL: Cortisol: 1.7 ug/dL — ABNORMAL LOW (ref 6.2–19.4)

## 2024-05-30 SURGERY — ARTHROPLASTY, HIP, TOTAL, ANTERIOR APPROACH
Anesthesia: Spinal | Site: Hip | Laterality: Left

## 2024-05-31 ENCOUNTER — Ambulatory Visit: Payer: Self-pay | Admitting: Internal Medicine

## 2024-05-31 DIAGNOSIS — R011 Cardiac murmur, unspecified: Secondary | ICD-10-CM

## 2024-05-31 DIAGNOSIS — E785 Hyperlipidemia, unspecified: Secondary | ICD-10-CM

## 2024-06-01 MED ORDER — ROSUVASTATIN CALCIUM 20 MG PO TABS: 20.0000 mg | ORAL_TABLET | Freq: Every day | ORAL | 3 refills | Status: AC

## 2024-06-06 ENCOUNTER — Other Ambulatory Visit: Payer: Self-pay | Admitting: Internal Medicine

## 2024-06-06 ENCOUNTER — Telehealth (HOSPITAL_COMMUNITY): Payer: Self-pay | Admitting: *Deleted

## 2024-06-06 DIAGNOSIS — Z01818 Encounter for other preprocedural examination: Secondary | ICD-10-CM

## 2024-06-06 DIAGNOSIS — R0609 Other forms of dyspnea: Secondary | ICD-10-CM

## 2024-06-06 NOTE — Telephone Encounter (Signed)
 Messaged Jean Kramer and she included Olam Ford who moved patient's tests up to tomorrow.  I spoke with patient and advised her of time, place and to review/follow instructions she was given to prepare for the tests.  I told her since I will be out of the office, she may speak with Channing to reschedule.

## 2024-06-06 NOTE — Telephone Encounter (Signed)
 Left message on voicemail per DPR in reference to upcoming appointment scheduled on 06/07/24  with detailed instructions given per Myocardial Perfusion Study Information Sheet for the test. LM to arrive 15 minutes early, and that it is imperative to arrive on time for appointment to keep from having the test rescheduled. If you need to cancel or reschedule your appointment, please call the office within 24 hours of your appointment. Failure to do so may result in a cancellation of your appointment, and a $50 no show fee. Phone number given for call back for any questions. Claudene Ronal Quale, RN

## 2024-06-07 ENCOUNTER — Other Ambulatory Visit: Payer: Self-pay

## 2024-06-07 ENCOUNTER — Ambulatory Visit (HOSPITAL_COMMUNITY)
Admission: RE | Admit: 2024-06-07 | Discharge: 2024-06-07 | Disposition: A | Source: Ambulatory Visit | Attending: Internal Medicine | Admitting: Internal Medicine

## 2024-06-07 ENCOUNTER — Ambulatory Visit (HOSPITAL_COMMUNITY)
Admission: RE | Admit: 2024-06-07 | Discharge: 2024-06-07 | Disposition: A | Discharge status: Home / Self Care | Source: Ambulatory Visit | Attending: Internal Medicine | Admitting: Internal Medicine

## 2024-06-07 DIAGNOSIS — R0609 Other forms of dyspnea: Secondary | ICD-10-CM | POA: Diagnosis present

## 2024-06-07 DIAGNOSIS — Z01818 Encounter for other preprocedural examination: Secondary | ICD-10-CM | POA: Diagnosis present

## 2024-06-07 DIAGNOSIS — R011 Cardiac murmur, unspecified: Secondary | ICD-10-CM | POA: Insufficient documentation

## 2024-06-07 LAB — ECHOCARDIOGRAM COMPLETE
AR max vel: 1.01 cm2
AV Area VTI: 1.24 cm2
AV Area mean vel: 1.16 cm2
AV Mean grad: 33 mmHg
AV Peak grad: 60.8 mmHg
Ao pk vel: 3.9 m/s
Area-P 1/2: 3.39 cm2
Height: 62 in
MV VTI: 2.17 cm2
S' Lateral: 2.2 cm
Weight: 3280 [oz_av]

## 2024-06-07 LAB — MYOCARDIAL PERFUSION IMAGING: Rest Nuclear Isotope Dose: 11 mCi

## 2024-06-07 MED ORDER — TECHNETIUM TC 99M TETROFOSMIN IV KIT
11.0000 | PACK | Freq: Once | INTRAVENOUS | Status: AC | PRN
  Administered 2024-06-07: 11 via INTRAVENOUS

## 2024-06-07 MED ORDER — REGADENOSON 0.4 MG/5ML IV SOLN
INTRAVENOUS | Status: AC
Start: 2024-06-07 — End: 2024-06-07
  Filled 2024-06-07: qty 5

## 2024-06-07 MED ORDER — PERFLUTREN LIPID MICROSPHERE
1.0000 mL | INTRAVENOUS | Status: DC | PRN
  Administered 2024-06-07: 2 mL via INTRAVENOUS

## 2024-06-08 ENCOUNTER — Ambulatory Visit: Payer: Self-pay | Admitting: Internal Medicine

## 2024-06-09 NOTE — Progress Notes (Signed)
 Left message to call back. 06/09/24

## 2024-06-12 ENCOUNTER — Encounter: Admitting: Orthopaedic Surgery

## 2024-06-12 ENCOUNTER — Other Ambulatory Visit

## 2024-06-12 DIAGNOSIS — K7460 Unspecified cirrhosis of liver: Secondary | ICD-10-CM

## 2024-06-12 LAB — LAB REPORT - SCANNED: EGFR: 51

## 2024-06-14 ENCOUNTER — Ambulatory Visit: Admitting: Orthopaedic Surgery

## 2024-06-14 LAB — AFP TUMOR MARKER: AFP-Tumor Marker: 5.3 ng/mL

## 2024-06-16 ENCOUNTER — Ambulatory Visit: Payer: Self-pay | Admitting: Gastroenterology

## 2024-06-27 NOTE — Telephone Encounter (Signed)
 Left detailed message on VM (DPR) adv that Dr. Okey left patient a message and that she wants pt to be reassessed before rescheduling Lexiscan  myoview .  Also will send this information to patient's active mychart account for review.  She has echo scheduled for 12/11 and may stop on 5 th floor at that time to schedule follow up.

## 2024-06-29 ENCOUNTER — Ambulatory Visit (HOSPITAL_COMMUNITY)
Admission: RE | Admit: 2024-06-29 | Discharge: 2024-06-29 | Disposition: A | Discharge status: Home / Self Care | Source: Ambulatory Visit | Attending: Internal Medicine | Admitting: Internal Medicine

## 2024-06-29 ENCOUNTER — Ambulatory Visit: Payer: Self-pay | Admitting: Internal Medicine

## 2024-06-29 DIAGNOSIS — R011 Cardiac murmur, unspecified: Secondary | ICD-10-CM | POA: Diagnosis not present

## 2024-06-29 LAB — ECHOCARDIOGRAM LIMITED BUBBLE STUDY

## 2024-07-05 ENCOUNTER — Ambulatory Visit: Payer: Self-pay | Admitting: Internal Medicine

## 2024-07-06 ENCOUNTER — Ambulatory Visit (HOSPITAL_COMMUNITY)

## 2024-07-06 ENCOUNTER — Encounter (HOSPITAL_COMMUNITY)

## 2024-07-24 ENCOUNTER — Encounter (HOSPITAL_BASED_OUTPATIENT_CLINIC_OR_DEPARTMENT_OTHER): Payer: Self-pay | Admitting: Radiology

## 2024-07-24 ENCOUNTER — Inpatient Hospital Stay (HOSPITAL_BASED_OUTPATIENT_CLINIC_OR_DEPARTMENT_OTHER)
Admission: RE | Admit: 2024-07-24 | Discharge: 2024-07-24 | Disposition: A | Source: Ambulatory Visit | Attending: Oncology | Admitting: Oncology

## 2024-07-24 ENCOUNTER — Ambulatory Visit (INDEPENDENT_AMBULATORY_CARE_PROVIDER_SITE_OTHER)
Admission: RE | Admit: 2024-07-24 | Discharge: 2024-07-24 | Disposition: A | Discharge status: Home / Self Care | Source: Ambulatory Visit | Attending: Oncology | Admitting: Oncology

## 2024-07-24 DIAGNOSIS — M8580 Other specified disorders of bone density and structure, unspecified site: Secondary | ICD-10-CM

## 2024-07-24 DIAGNOSIS — Z1231 Encounter for screening mammogram for malignant neoplasm of breast: Secondary | ICD-10-CM | POA: Diagnosis not present

## 2024-07-24 DIAGNOSIS — Z780 Asymptomatic menopausal state: Secondary | ICD-10-CM | POA: Diagnosis not present

## 2024-07-24 DIAGNOSIS — D472 Monoclonal gammopathy: Secondary | ICD-10-CM

## 2024-07-27 ENCOUNTER — Other Ambulatory Visit: Payer: Self-pay

## 2024-07-27 DIAGNOSIS — Z01818 Encounter for other preprocedural examination: Secondary | ICD-10-CM

## 2024-07-27 DIAGNOSIS — Z136 Encounter for screening for cardiovascular disorders: Secondary | ICD-10-CM

## 2024-07-27 DIAGNOSIS — R0609 Other forms of dyspnea: Secondary | ICD-10-CM

## 2024-08-04 ENCOUNTER — Telehealth: Payer: Self-pay | Admitting: Oncology

## 2024-08-04 ENCOUNTER — Inpatient Hospital Stay: Attending: Hematology and Oncology

## 2024-08-04 ENCOUNTER — Inpatient Hospital Stay: Admitting: Oncology

## 2024-08-04 ENCOUNTER — Other Ambulatory Visit: Payer: Self-pay | Admitting: Oncology

## 2024-08-04 VITALS — BP 89/56 | HR 80 | Temp 98.4°F | Resp 18 | Ht 62.0 in

## 2024-08-04 DIAGNOSIS — D693 Immune thrombocytopenic purpura: Secondary | ICD-10-CM | POA: Diagnosis not present

## 2024-08-04 DIAGNOSIS — D472 Monoclonal gammopathy: Secondary | ICD-10-CM | POA: Diagnosis not present

## 2024-08-04 LAB — CBC WITH DIFFERENTIAL (CANCER CENTER ONLY)
Abs Immature Granulocytes: 0.09 K/uL — ABNORMAL HIGH (ref 0.00–0.07)
Basophils Absolute: 0.1 K/uL (ref 0.0–0.1)
Basophils Relative: 1 %
Eosinophils Absolute: 0.2 K/uL (ref 0.0–0.5)
Eosinophils Relative: 1 %
HCT: 35 % — ABNORMAL LOW (ref 36.0–46.0)
Hemoglobin: 11.2 g/dL — ABNORMAL LOW (ref 12.0–15.0)
Immature Granulocytes: 1 %
Lymphocytes Relative: 29 %
Lymphs Abs: 4 K/uL (ref 0.7–4.0)
MCH: 31.6 pg (ref 26.0–34.0)
MCHC: 32 g/dL (ref 30.0–36.0)
MCV: 98.9 fL (ref 80.0–100.0)
Monocytes Absolute: 1.2 K/uL — ABNORMAL HIGH (ref 0.1–1.0)
Monocytes Relative: 8 %
Neutro Abs: 8.4 K/uL — ABNORMAL HIGH (ref 1.7–7.7)
Neutrophils Relative %: 60 %
Platelet Count: 262 K/uL (ref 150–400)
RBC: 3.54 MIL/uL — ABNORMAL LOW (ref 3.87–5.11)
RDW: 15.7 % — ABNORMAL HIGH (ref 11.5–15.5)
WBC Count: 14 K/uL — ABNORMAL HIGH (ref 4.0–10.5)
nRBC: 0 % (ref 0.0–0.2)

## 2024-08-04 LAB — CMP (CANCER CENTER ONLY)
ALT: 40 U/L (ref 0–44)
AST: 50 U/L — ABNORMAL HIGH (ref 15–41)
Albumin: 3.8 g/dL (ref 3.5–5.0)
Alkaline Phosphatase: 89 U/L (ref 38–126)
Anion gap: 11 (ref 5–15)
BUN: 23 mg/dL (ref 8–23)
CO2: 24 mmol/L (ref 22–32)
Calcium: 10.2 mg/dL (ref 8.9–10.3)
Chloride: 104 mmol/L (ref 98–111)
Creatinine: 0.79 mg/dL (ref 0.44–1.00)
GFR, Estimated: >60 mL/min
Glucose, Bld: 105 mg/dL — ABNORMAL HIGH (ref 70–99)
Potassium: 4.7 mmol/L (ref 3.5–5.1)
Sodium: 138 mmol/L (ref 135–145)
Total Bilirubin: 0.6 mg/dL (ref 0.0–1.2)
Total Protein: 7.8 g/dL (ref 6.5–8.1)

## 2024-08-04 NOTE — Progress Notes (Signed)
 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  She has been in remission since November 2022 after receiving rituximab  for 4 cycles.  We will plan continued follow-up.  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 Her IgA level was 1172, with elevated IgG of 1682 and decreased IgM of 22 Serum light chains were both elevated, with lambda at 116 but normal ratio of 0.51.  Her beta 2 microglobulin is mildly elevated at 2.6. Her monoclonal spike has remained stable at 0.8 for the last two readings. She has had occasional hypercalcemia.   Tobacco abuse She started smoking at age 41 and so she has been smoking approximately 1 pack per day for 50+ years and continued to smoke despite numerous discussions about smoking cessation.  She finally quit smoking in February 2023 and she finally has stopped vaping. She will need low dose CT chest for lung cancer screening.     Leukocytosis Chronic mild leukocytosis, but her WBC is normal today at 10.0.    Cirrhosis of liver with ascites (HCC) Cirrhosis.  Recent liver ultrasound was stable.  AFP was checked last time and is normal. Her liver function tests are nearly normal today.  Peptic ulcer disease She was found to have a gastric ulcer in July of 2023 as well as esophagitis.  Colon polyps These were tubular adenomas in July of 2023 so repeat was recommended in 3 years.  Low B12 level This is continues to be in the low normal range.  Severe osteoarthritis She was scheduled for hip replacement next week but this was canceled due to insurance issues. Now she can barely ambulate or get up and down. She needs a raised toilet seat to be able to use the toilet.  She will have  to see another orthopedic surgeon in order to get her hip surgery scheduled in [place].  Osteopenia This is mild and relatively stable.  Hypotension She has suspected adrenal insufficiency but is waiting to see an endocrinologist. She is already on Midodrine  10 mg TID with persistent hypotension.  Plan: She complains of left hip pain rated 10/10. She has been diagnosed with liver cirrhosis, but her liver function tests look good today. Her latest problem has been hypotension and this persists despite Midodrine  10 mg TID and she has been referred to an endocrinologist. She completed a bilateral screening mammogram on 07/24/2024 which revealed no mammographic evidence of malignancy. Her bone density scan on 07/24/2024 revealed osteopenia with a T-score of -0.1 for the lumbar spine (L1-L4), -1.5 for the left femoral neck, -1.2 for the left total hip, -1.4 for the right femoral neck, and -1.5 for the right total hip. She has a chronically elevated WBC of 14.0 with an elevated ANC of 8400 up from 11.5, a low hemoglobin of 11.2 down from 12.6, and platelet count of 262,000. Her CMP is normal other than an elevated AST of 50, up from 41. Her SPEP and quantitative antibodies are pending. She affirms bleeding of her gums but denies hematochezia and melena. I encouraged her to keep drinking plenty of fluids. I will see her back in 3 months with CBC and CMP. She states that she is supposed to meet with an endocrinologist,  to evaluate her hypotension and suspected adrenal insufficiency, but she is waiting for an appointment/referral. The patient understands the plans discussed today and is in agreement with them.  She knows to contact our office if she develops concerns prior to her next appointment.  I provided 16 minutes of face-to-face time during this this encounter and > 50% was spent counseling as documented under my assessment and plan.   [name], MD  [facility]  [facility] - A DEPT OF [facility] [address] [place] [address] Dept: [phone] Dept Fax: [phone]   No orders of the defined types were placed in this encounter.   CHIEF COMPLAINT:  CC: History of ITP with multiple recurrences, new MGUS IgA lambda in 2024  Current Treatment: Surveillance  HISTORY OF PRESENT ILLNESS:  Jean Kramer is a 74 year old with a history of ITP originally diagnosed in the 1960's.  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 She initially responded to that, but over the years has been on and off corticosteroids for relapsing ITP, and was also treated with danazol for a time.  Her ITP had been in remission for many years. She has also has had chronic leukocytosis.    Annual mammogram in May 2018 revealed an intraductal mass in the right breast at 10 o 'clock measuring 6 mm.  We recommended an ultrasound-guided biopsy which revealed fibroadenomatoid and fibrocystic changes, including cystic and micro papillary apocrine metaplasia, apocrine adenosis, stromal fibrosis, and foci of pseudo angiomatous stromal hyperplasia and focal sclerosis.  It was recommended that she have excision of this area and that was performed in July by Dr. Prentice Conch.  The final pathology revealed atrophic breast tissue with fibrocystic changes and foci of usual ductal hyperplasia but no intraductal papilloma, fibroadenoma, atypia or malignancy.  There was another lumpectomy sample, which revealed micropapillary ductal hyperplasia, focal ectatic ducts, and patchy periductal chronic inflammation.  The other area of lumpectomy had morphologic changes similar to the original biopsy.  We had been seeing her regularly, but then she was lost to follow-up after May 2019. She had a diagnostic right mammogram in April 2020, which  revealed evolving fat necrosis.  Annual bilateral screening mammograms in October 2022 and November 2023 did not reveal any evidence of malignancy.   She was admitted to [facility] in July 2022 due to developing an upper GI bleed from taking BC powders.  We had advised her to discontinue these previously.  She switched to Tylenol  for her pain.  While in the hospital, her platelet count dropped into the teens, and she was given steroids and IVIG.   She had extensive bruising. She was in the hospital for a total of 15 days and her platelet count had gone up to 63,000 at the time of discharge.  She was referred back to us .  She was given a prescription for prednisone  at the time of discharge, but Dr. Cornish advised her not to take this, as her platelet count was normal. When we saw her in early August her platelet count was up to 168,000.  She had leukocytosis with white count was 14,000.  Her bruises had resolved and she was doing well.   She was then admitted to [facility] in August 2022 due to severe  thrombocytopenia with a platelet count less than 5,000.  She presented to the hospital after she removed a scab from her knee, and the wound continued to ooze blood.  She received Nplate  injection x2, as well as 2 units of PRBCs and 1 unit of platelets. Upon discharge, her platelet count had improved to 17,000.  She was advised to discontinue aspirin  81 mg.  She once again had extensive bruising, but also noted shortness of breath with exertion, insomnia, and occasional dizziness and lightheadedness.  We saw her for hospital follow-up and she had resolution of the leukocytosis and her platelet count was up  to 41,000.  Chemistries revealed worsening elevation of the liver transaminases with an SGOT of 117 and an SGPT of 90.  She states that she has known fatty liver.  As her platelets remained low, she was started on prednisone  60 mg daily. She initially had an excellent response to high-dose prednisone ,  but when we tapered her rapidly, she had recurrent severe thrombocytopenia.  We were unable to slowly taper the prednisone , so she went on to receive rituximab  weekly for 4 weeks with an excellent response.  She was rapidly tapered off of prednisone  while receiving rituximab .  Her platelets have remained normal since November 2022.     She was diagnosed with liver cirrhosis in 2023 and sees Dr. Leigh in [facility].  EGD in July 2023 revealed LA Grade A esophagitis fountolod focally at the GEJ. A plaque was found in the lower third of the esophagus, 35 cm from the incisors, could not be lavaged off. Biopsies were taken with a cold forceps for histology. The exam of the esophagus was otherwise normal. No varices. Diffuse mild inflammation characterized by erythema, friability and granularity was found in the entire examined stomach. One non-bleeding gastric ulcer with no stigmata of bleeding was found in the prepyloric region of the stomach, with some associated inflammatory nodularity. The lesion was 3 mm in largest dimension. Biopsies were taken from the periphery of the lesion with a cold forceps for histology. The exam of the stomach was otherwise normal. No varices. Biopsies were taken with a cold forceps in the gastric body, at the incisura and in the gastric antrum for Helicobacter pylori testing. The examined duodenum was normal.  She had colonoscopy and repeat EGD in October 2023 with removal of several small colon polyps.  Pathology revealed tubular adenomas.  EGD revealed good healing of the gastric ulcer with no new findings.  Follow-up colonoscopy and EGD in 3 years was recommended.   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 Her Monoclonal-Spike has been stable at 0.8. She has had a splenectomy twice. Patient states that she feels fair but complains of left hip pain rated 10/10. She  has been diagnosed with liver cirrhosis, but her liver function tests look good. Her latest problem has been hypotension and this persists despite Midodrine  10 mg TID and she has been referred to an endocrinologist. She completed a bilateral screening mammogram on 07/24/2024 which revealed no mammographic evidence of malignancy. Her bone density scan on 07/24/2024 revealed osteopenia with a T-score of -0.1 for the lumbar spine (L1-L4), -1.5 for the left femoral neck, -1.2 for the left total hip, -1.4 for the right femoral neck, and -1.5 for the right total hip. She has an elevated WBC of 14.0 with an elevated ANC of 8400 up from  11.5 with an ANC of 6600, a low hemoglobin of 11.2 down from 12.6, and platelet count of 262,000. Her CMP is normal other than an elevated AST of 50, up from 41. Her SPEP and quantitative antibodies are pending. She affirms bleeding of her gums but denies hematochezia and melena. I encouraged her to keep drinking plenty of fluids. I will see her back in 3 months with CBC and CMP. She states that she is supposed to meet with an endocrinologist, to evaluate her hypotension and suspected adrenal insufficiency, but she is waiting for an appointment.  She denies fever, chills, night sweats, or other signs of infection. She denies cardiorespiratory and gastrointestinal issues. Her appetite is good and Her weight has increased 7 pounds over last 3 months.   REVIEW OF SYSTEMS:  Review of Systems  Constitutional:  Positive for fatigue. Negative for appetite change, chills, diaphoresis, fever and unexpected weight change.  HENT:  Negative.  Negative for hearing loss, lump/mass, mouth sores, nosebleeds, sore throat, tinnitus, trouble swallowing and voice change.        Pulsatile tinnitus  Eyes:  Positive for eye problems (occasional blurry, floaters). Negative for icterus.  Respiratory:  Positive for shortness of breath (chronic, mild with exertion). Negative for chest tightness, cough,  hemoptysis and wheezing.   Cardiovascular:  Negative for chest pain, leg swelling and palpitations.  Gastrointestinal:  Negative for abdominal distention, abdominal pain, blood in stool, constipation, diarrhea, nausea, rectal pain and vomiting.  Endocrine: Negative.  Negative for hot flashes.  Genitourinary: Negative.  Negative for bladder incontinence, difficulty urinating, dyspareunia, dysuria, frequency, hematuria, menstrual problem, nocturia, pelvic pain, vaginal bleeding and vaginal discharge.   Musculoskeletal:  Positive for arthralgias (severe, chronic, especially left hip). Negative for back pain, flank pain, gait problem, myalgias, neck pain and neck stiffness.       Knee pain  Skin: Negative.  Negative for itching, rash and wound.  Neurological:  Positive for dizziness and numbness (Neuropathy bilateral feet). Negative for extremity weakness, gait problem, headaches, light-headedness, seizures and speech difficulty.  Hematological:  Negative for adenopathy. Bruises/bleeds easily.  Psychiatric/Behavioral: Negative.  Negative for confusion, decreased concentration, depression, sleep disturbance and suicidal ideas. The patient is not nervous/anxious.     VITALS:  Blood pressure (!) 89/56, pulse 80, temperature 98.4 F (36.9 C), temperature source Oral, resp. rate 18, height 5' 2 (1.575 m), SpO2 97%.  Wt Readings from Last 3 Encounters:  06/07/24 205 lb (93 kg)  05/29/24 205 lb 6.4 oz (93.2 kg)  05/26/24 198 lb 3.2 oz (89.9 kg)    Body mass index is 37.49 kg/m.  Performance status (ECOG): 1 - Symptomatic but completely ambulatory  PHYSICAL EXAM:  Physical Exam Vitals and nursing note reviewed.  Constitutional:      General: She is not in acute distress.    Appearance: Normal appearance. She is normal weight. She is not ill-appearing, toxic-appearing or diaphoretic.  HENT:     Head: Normocephalic and atraumatic.     Right Ear: Tympanic membrane, ear canal and external ear  normal. There is no impacted cerumen.     Left Ear: Tympanic membrane, ear canal and external ear normal. There is no impacted cerumen.     Nose: Nose normal. No congestion or rhinorrhea.     Mouth/Throat:     Mouth: Mucous membranes are moist.     Pharynx: Oropharynx is clear. No oropharyngeal exudate or posterior oropharyngeal erythema.  Eyes:     General: No scleral icterus.  Right eye: No discharge.        Left eye: No discharge.     Extraocular Movements: Extraocular movements intact.     Conjunctiva/sclera: Conjunctivae normal.     Pupils: Pupils are equal, round, and reactive to light.  Neck:     Vascular: No carotid bruit.  Cardiovascular:     Rate and Rhythm: Normal rate and regular rhythm.     Pulses: Normal pulses.     Heart sounds: Normal heart sounds. No murmur heard.    No friction rub. No gallop.  Pulmonary:     Effort: Pulmonary effort is normal. No respiratory distress.     Breath sounds: Normal breath sounds. No stridor. No wheezing, rhonchi or rales.  Chest:     Chest wall: No tenderness.  Abdominal:     General: Bowel sounds are normal. There is no distension.     Palpations: Abdomen is soft. There is no hepatomegaly, splenomegaly or mass.     Tenderness: There is no abdominal tenderness. There is no right CVA tenderness, left CVA tenderness, guarding or rebound.     Hernia: No hernia is present.  Musculoskeletal:        General: No swelling, tenderness, deformity or signs of injury. Normal range of motion.     Cervical back: Normal range of motion and neck supple. No rigidity or tenderness.     Right lower leg: No edema.     Left lower leg: No edema.  Lymphadenopathy:     Cervical: No cervical adenopathy.     Right cervical: No superficial, deep or posterior cervical adenopathy.    Left cervical: No superficial, deep or posterior cervical adenopathy.     Upper Body:     Right upper body: No supraclavicular, axillary or pectoral adenopathy.     Left  upper body: No supraclavicular, axillary or pectoral adenopathy.  Skin:    General: Skin is warm and dry.     Coloration: Skin is not jaundiced or pale.     Findings: No bruising, lesion or rash.  Neurological:     General: No focal deficit present.     Mental Status: She is alert and oriented to person, place, and time. Mental status is at baseline.     Cranial Nerves: No cranial nerve deficit.     Sensory: No sensory deficit.     Motor: No weakness.     Coordination: Coordination normal.     Gait: Gait normal.     Deep Tendon Reflexes: Reflexes normal.  Psychiatric:        Mood and Affect: Mood normal.        Behavior: Behavior normal.        Thought Content: Thought content normal.        Judgment: Judgment normal.    LABS:      Latest Ref Rng & Units 08/04/2024    2:58 PM 05/26/2024   11:30 AM 05/05/2024    2:28 PM  CBC  WBC 4.0 - 10.5 K/uL 14.0  11.9  11.5   Hemoglobin 12.0 - 15.0 g/dL 88.7  86.8  87.3   Hematocrit 36.0 - 46.0 % 35.0  39.8  36.7   Platelets 150 - 400 K/uL 262  267  232       Latest Ref Rng & Units 08/04/2024    2:58 PM 05/26/2024   11:30 AM 05/05/2024    2:28 PM  CMP  Glucose 70 - 99 mg/dL 894  888  77  BUN 8 - 23 mg/dL 23  14  15    Creatinine 0.44 - 1.00 mg/dL 9.20  8.96  9.13   Sodium 135 - 145 mmol/L 138  136  139   Potassium 3.5 - 5.1 mmol/L 4.7  4.6  4.0   Chloride 98 - 111 mmol/L 104  102  105   CO2 22 - 32 mmol/L 24  23  24    Calcium  8.9 - 10.3 mg/dL 89.7  9.3  88.8   Total Protein 6.5 - 8.1 g/dL 7.8   7.8   Total Bilirubin 0.0 - 1.2 mg/dL 0.6   0.5   Alkaline Phos 38 - 126 U/L 89   99   AST 15 - 41 U/L 50   41   ALT 0 - 44 U/L 40   41    Lab Results  Component Value Date   TOTALPROTELP 7.3 08/04/2024   ALBUMINELP 3.2 08/04/2024   A1GS 0.2 08/04/2024   A2GS 0.8 08/04/2024   BETS 2.0 (H) 08/04/2024   GAMS 1.1 08/04/2024   MSPIKE 0.8 (H) 08/04/2024   SPEI Comment 08/04/2024   Lab Results  Component Value Date   TIBC 345.8  05/08/2022   TIBC 330 02/08/2021   FERRITIN 271.8 05/08/2022   FERRITIN 137 02/08/2021   IRONPCTSAT 26.9 05/08/2022   IRONPCTSAT 11 02/08/2021   Lab Results  Component Value Date   TSH 1.477 06/21/2023    STUDIES:  EXAM: 07/24/2024 DUAL X-RAY ABSORPTIOMETRY (DXA) FOR BONE MINERAL DENSITY FINDINGS: LUMBAR SPINE (L1-L4): T-score: -0.1 Z-score: 1.6   LEFT FEMORAL NECK: T-score: -1.5 Z-score: 0.3   LEFT TOTAL HIP: T-score: -1.2 Z-score: 0.5   RIGHT FEMORAL NECK: T-score: -1.4 Z-score: 0.4   RIGHT TOTAL HIP: T-score: -1.5 Z-score: 0.2  EXAM: 07/24/2024 DIGITAL SCREENING BILATERAL MAMMOGRAM WITH TOMOSYNTHESIS AND CAD IMPRESSION: No mammographic evidence of malignancy.   EXAM: 11/17/2023 ULTRASOUND ABDOMEN LIMITED RIGHT UPPER QUADRANT  IMPRESSION: Cirrhotic liver. No focal liver lesion identified.  EXAM: 08/20/2023 CT CHEST W/O CM IMPRESSION: Redemonstration of bilateral airspace disease involving all lobes, most consistent with pneumonia, without significant interval change in appearance.  EXAM: 06/21/2023 MAM DIGITAL TOMO SCREENING B IMPRESSION: There is stable fat necrosis, trabecular thickening and skin retraction in the right breast, consistent with history of biopsy. The left breast is stable.  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 Her smoking use included cigarettes. She has never used smokeless tobacco. She reports that she does not drink alcohol and does not use drugs.The patient is alone today.  Allergies: No Known Allergies  Current Medications: Current Outpatient Medications  Medication Sig Dispense Refill   albuterol  (VENTOLIN  HFA) 108 (90 Base) MCG/ACT inhaler Inhale 2 puffs into the lungs every 6 (six) hours as needed for wheezing or shortness of breath.     alendronate (FOSAMAX) 70 MG tablet Take 70 mg by mouth every Friday.     Biotin w/ Vitamins C & E (HAIR/SKIN/NAILS PO) Take 2 each by mouth daily. (gummies)     Budeson-Glycopyrrol-Formoterol  (BREZTRI AEROSPHERE) 160-9-4.8 MCG/ACT AERO Inhale 2 puffs into the lungs 2 (two) times daily.     busPIRone  (BUSPAR ) 15 MG tablet Take 15 mg by mouth 2 (two) times daily.     Cholecalciferol  (VITAMIN D -3 PO) Take 1 capsule by mouth daily.     clotrimazole (MYCELEX) 10 MG troche Take 10 mg by mouth 4 (four) times daily as needed (trush (tongue pain) caused by [name]).     Cyanocobalamin  (B-12 PO) Take 2 each by mouth daily. (gummies)     docusate sodium  (COLACE) 100 MG capsule Take 200 mg by mouth 2 (two) times daily. 3 tabs at bedtime     hydrOXYzine  (ATARAX ) 25 MG tablet Take 25 mg by mouth 2 (two) times daily.     ipratropium-albuterol  (DUONEB) 0.5-2.5 (3) MG/3ML SOLN Inhale 3 mLs into the lungs every 6 (six) hours as needed for shortness of breath.     levothyroxine  (SYNTHROID ) 50 MCG tablet Take 50 mcg by mouth daily.     lubiprostone (AMITIZA) 24 MCG capsule Take 24 mcg by mouth 2 (two) times daily.     metFORMIN  (GLUCOPHAGE-XR) 500 MG 24 hr tablet Take 500 mg by mouth daily with supper.     midodrine  (PROAMATINE ) 10 MG tablet Take 1 tablet (10 mg total) by mouth 3 (three) times daily with meals. 90 tablet 3   omeprazole  (PRILOSEC) 40 MG capsule TAKE 1 CAPSULE BY MOUTH DAILY 90 capsule 1   ondansetron  (ZOFRAN ) 4 MG tablet Take 1 tablet (4 mg total) by mouth every 4 (four) hours as needed for nausea. 90 tablet 3   Oxycodone  HCl 20 MG TABS Take 20 mg by mouth every 8 (eight) hours as needed (severe pain).     pramipexole  (MIRAPEX ) 1 MG tablet Take 1 mg by mouth at bedtime.     QUEtiapine  (SEROQUEL ) 200 MG tablet Take 200 mg by mouth at bedtime.     rosuvastatin  (CRESTOR ) 20 MG tablet Take 1 tablet (20 mg total) by mouth daily. 90 tablet 3   No current facility-administered medications for this visit.    I,Omar Orrego H Yasmeen Manka,acting as a scribe for [name], MD.,have documented all relevant documentation on the behalf of [name], MD,as directed by  [name], MD while in the presence of [name], MD.  I have reviewed this report as typed by the medical scribe, and it is complete and accurate.  "

## 2024-08-04 NOTE — Telephone Encounter (Signed)
 Patient has been scheduled for follow-up visit per 08/04/2024 LOS.  Pt given an appt calendar with date and time.

## 2024-08-06 ENCOUNTER — Other Ambulatory Visit: Payer: Self-pay | Admitting: Gastroenterology

## 2024-08-06 DIAGNOSIS — K2970 Gastritis, unspecified, without bleeding: Secondary | ICD-10-CM

## 2024-08-06 LAB — IGG, IGA, IGM
IgA: 1165 mg/dL — ABNORMAL HIGH (ref 64–422)
IgG (Immunoglobin G), Serum: 1841 mg/dL — ABNORMAL HIGH (ref 586–1602)
IgM (Immunoglobulin M), Srm: 20 mg/dL — ABNORMAL LOW (ref 26–217)

## 2024-08-08 LAB — PROTEIN ELECTROPHORESIS, SERUM
A/G Ratio: 0.8 (ref 0.7–1.7)
Albumin ELP: 3.2 g/dL (ref 2.9–4.4)
Alpha-1-Globulin: 0.2 g/dL (ref 0.0–0.4)
Alpha-2-Globulin: 0.8 g/dL (ref 0.4–1.0)
Beta Globulin: 2 g/dL — ABNORMAL HIGH (ref 0.7–1.3)
Gamma Globulin: 1.1 g/dL (ref 0.4–1.8)
Globulin, Total: 4.1 g/dL — ABNORMAL HIGH (ref 2.2–3.9)
M-Spike, %: 0.8 g/dL — ABNORMAL HIGH
Total Protein ELP: 7.3 g/dL (ref 6.0–8.5)

## 2024-08-10 ENCOUNTER — Encounter: Payer: Self-pay | Admitting: Oncology

## 2024-08-11 ENCOUNTER — Telehealth: Payer: Self-pay | Admitting: *Deleted

## 2024-08-11 NOTE — Telephone Encounter (Signed)
"    Dr Okey, spoke on 08/07/24  to patient Discussion about having Myoview  to schedule   Here is message received:   [name], MD  Jean Reena GAILS, RN Spoke to patient    She follows with Dr Pia   No plans for surgery at present    BP is still on low side. She will call back when surgery is being considered       Previous Messages    ----- Message ----- From: Jean Reena GAILS, RN Sent: 08/03/2024  11:38 AM EST To: Jean Okey GAILS, MD; [facility] Triage  Routed to Dr Okey Reena RN ----- Message ----- From: Gordon Ronal SQUIBB, RN Sent: 08/03/2024   9:09 AM EST To: [facility] Triage  Dr. Okey pt ----- Message ----- From: [name] Sent: 08/03/2024   8:32 AM EST To: Ronal SQUIBB Gordon, RN  [facility]!!!!!  I called this patient and she did not wish to schedule the Myoview  at this time due to her low BP. She states that she thought the MD did not want the test anymore??? She does not want to waste her Son's time to get off work and then not be able to do the test like in the past.. She would like someone to call her when they can or call Son and let them know what to do please.. I hope you have a blessed day!!! "

## 2024-08-15 ENCOUNTER — Telehealth: Payer: Self-pay

## 2024-08-15 NOTE — Telephone Encounter (Signed)
-----   Message from Wanda Cornish, MD sent at 08/10/2024  7:49 PM EST ----- Regarding: call Tell her the special tests for her monoclonal protein remain stable

## 2024-08-15 NOTE — Telephone Encounter (Signed)
 Attempted to contact patient. No answer.

## 2024-08-25 NOTE — Telephone Encounter (Signed)
 Patient is calling requesting a stress test while her BP is up.

## 2024-11-02 ENCOUNTER — Inpatient Hospital Stay

## 2024-11-02 ENCOUNTER — Inpatient Hospital Stay: Admitting: Oncology

## 2024-11-28 ENCOUNTER — Ambulatory Visit: Admitting: "Endocrinology
